# Patient Record
Sex: Female | Born: 1940 | Race: White | Hispanic: No | Smoking: Never smoker
Health system: Southern US, Community
[De-identification: ages and names within clinical notes are randomized; demographics above are authoritative.]

## PROBLEM LIST (undated history)

## (undated) DIAGNOSIS — Z9889 Other specified postprocedural states: Secondary | ICD-10-CM

## (undated) DIAGNOSIS — G459 Transient cerebral ischemic attack, unspecified: Secondary | ICD-10-CM

## (undated) DIAGNOSIS — R319 Hematuria, unspecified: Secondary | ICD-10-CM

## (undated) DIAGNOSIS — G629 Polyneuropathy, unspecified: Secondary | ICD-10-CM

## (undated) DIAGNOSIS — R7301 Impaired fasting glucose: Secondary | ICD-10-CM

## (undated) DIAGNOSIS — K649 Unspecified hemorrhoids: Secondary | ICD-10-CM

## (undated) DIAGNOSIS — E785 Hyperlipidemia, unspecified: Secondary | ICD-10-CM

## (undated) DIAGNOSIS — R112 Nausea with vomiting, unspecified: Secondary | ICD-10-CM

## (undated) DIAGNOSIS — M1990 Unspecified osteoarthritis, unspecified site: Secondary | ICD-10-CM

## (undated) DIAGNOSIS — R102 Pelvic and perineal pain: Secondary | ICD-10-CM

## (undated) DIAGNOSIS — N952 Postmenopausal atrophic vaginitis: Secondary | ICD-10-CM

## (undated) DIAGNOSIS — E538 Deficiency of other specified B group vitamins: Secondary | ICD-10-CM

## (undated) HISTORY — PX: TUBAL LIGATION: SHX77

## (undated) HISTORY — PX: ABDOMINAL HYSTERECTOMY: SHX81

## (undated) HISTORY — DX: Unspecified hemorrhoids: K64.9

## (undated) HISTORY — DX: Transient cerebral ischemic attack, unspecified: G45.9

## (undated) HISTORY — PX: OTHER SURGICAL HISTORY: SHX169

## (undated) HISTORY — DX: Postmenopausal atrophic vaginitis: N95.2

## (undated) HISTORY — DX: Unspecified osteoarthritis, unspecified site: M19.90

## (undated) HISTORY — DX: Hematuria, unspecified: R31.9

## (undated) HISTORY — DX: Hyperlipidemia, unspecified: E78.5

## (undated) HISTORY — DX: Pelvic and perineal pain: R10.2

## (undated) HISTORY — DX: Polyneuropathy, unspecified: G62.9

## (undated) HISTORY — PX: CATARACT EXTRACTION: SUR2

## (undated) HISTORY — DX: Impaired fasting glucose: R73.01

## (undated) HISTORY — DX: Deficiency of other specified B group vitamins: E53.8

## (undated) HISTORY — PX: ANKLE FRACTURE SURGERY: SHX122

## (undated) HISTORY — PX: APPENDECTOMY: SHX54

---

## 1998-06-12 ENCOUNTER — Encounter: Payer: Self-pay | Admitting: Emergency Medicine

## 1998-06-12 ENCOUNTER — Emergency Department (HOSPITAL_COMMUNITY): Admission: EM | Admit: 1998-06-12 | Discharge: 1998-06-12 | Payer: Self-pay | Admitting: Emergency Medicine

## 2000-11-19 ENCOUNTER — Ambulatory Visit (HOSPITAL_COMMUNITY): Admission: RE | Admit: 2000-11-19 | Discharge: 2000-11-19 | Payer: Self-pay | Admitting: Internal Medicine

## 2000-11-19 ENCOUNTER — Encounter: Payer: Self-pay | Admitting: Internal Medicine

## 2001-11-29 ENCOUNTER — Ambulatory Visit (HOSPITAL_COMMUNITY): Admission: RE | Admit: 2001-11-29 | Discharge: 2001-11-29 | Payer: Self-pay | Admitting: Internal Medicine

## 2001-11-29 ENCOUNTER — Encounter: Payer: Self-pay | Admitting: Internal Medicine

## 2001-12-05 ENCOUNTER — Encounter: Payer: Self-pay | Admitting: Internal Medicine

## 2001-12-05 ENCOUNTER — Ambulatory Visit (HOSPITAL_COMMUNITY): Admission: RE | Admit: 2001-12-05 | Discharge: 2001-12-05 | Payer: Self-pay | Admitting: Internal Medicine

## 2002-12-08 ENCOUNTER — Ambulatory Visit (HOSPITAL_COMMUNITY): Admission: RE | Admit: 2002-12-08 | Discharge: 2002-12-08 | Payer: Self-pay | Admitting: Internal Medicine

## 2003-09-23 ENCOUNTER — Encounter: Payer: Self-pay | Admitting: Orthopedic Surgery

## 2003-09-30 ENCOUNTER — Ambulatory Visit (HOSPITAL_COMMUNITY): Admission: RE | Admit: 2003-09-30 | Discharge: 2003-09-30 | Payer: Self-pay | Admitting: Internal Medicine

## 2003-09-30 HISTORY — PX: COLONOSCOPY: SHX174

## 2003-12-29 ENCOUNTER — Ambulatory Visit (HOSPITAL_COMMUNITY): Admission: RE | Admit: 2003-12-29 | Discharge: 2003-12-29 | Payer: Self-pay | Admitting: Internal Medicine

## 2004-12-29 ENCOUNTER — Ambulatory Visit (HOSPITAL_COMMUNITY): Admission: RE | Admit: 2004-12-29 | Discharge: 2004-12-29 | Payer: Self-pay | Admitting: Internal Medicine

## 2005-10-29 ENCOUNTER — Ambulatory Visit: Payer: Self-pay | Admitting: Orthopedic Surgery

## 2005-10-29 ENCOUNTER — Inpatient Hospital Stay (HOSPITAL_COMMUNITY): Admission: EM | Admit: 2005-10-29 | Discharge: 2005-10-31 | Payer: Self-pay | Admitting: Emergency Medicine

## 2005-10-30 ENCOUNTER — Encounter: Payer: Self-pay | Admitting: Orthopedic Surgery

## 2005-11-06 ENCOUNTER — Ambulatory Visit: Payer: Self-pay | Admitting: Orthopedic Surgery

## 2005-11-09 ENCOUNTER — Ambulatory Visit: Payer: Self-pay | Admitting: Orthopedic Surgery

## 2005-11-13 ENCOUNTER — Ambulatory Visit: Payer: Self-pay | Admitting: Orthopedic Surgery

## 2005-11-27 ENCOUNTER — Ambulatory Visit: Payer: Self-pay | Admitting: Orthopedic Surgery

## 2005-12-11 ENCOUNTER — Ambulatory Visit: Payer: Self-pay | Admitting: Orthopedic Surgery

## 2005-12-13 ENCOUNTER — Encounter (HOSPITAL_COMMUNITY): Admission: RE | Admit: 2005-12-13 | Discharge: 2006-01-22 | Payer: Self-pay | Admitting: Orthopedic Surgery

## 2005-12-25 ENCOUNTER — Ambulatory Visit: Payer: Self-pay | Admitting: Orthopedic Surgery

## 2006-01-01 ENCOUNTER — Ambulatory Visit (HOSPITAL_COMMUNITY): Admission: RE | Admit: 2006-01-01 | Discharge: 2006-01-01 | Payer: Self-pay | Admitting: Internal Medicine

## 2006-01-24 ENCOUNTER — Encounter (HOSPITAL_COMMUNITY): Admission: RE | Admit: 2006-01-24 | Discharge: 2006-02-23 | Payer: Self-pay | Admitting: Orthopedic Surgery

## 2006-01-25 ENCOUNTER — Ambulatory Visit: Payer: Self-pay | Admitting: Orthopedic Surgery

## 2006-02-22 ENCOUNTER — Ambulatory Visit: Payer: Self-pay | Admitting: Orthopedic Surgery

## 2006-03-14 ENCOUNTER — Ambulatory Visit: Payer: Self-pay | Admitting: Orthopedic Surgery

## 2006-11-28 ENCOUNTER — Ambulatory Visit (HOSPITAL_COMMUNITY): Admission: RE | Admit: 2006-11-28 | Discharge: 2006-11-28 | Payer: Self-pay | Admitting: Internal Medicine

## 2006-11-28 ENCOUNTER — Encounter: Payer: Self-pay | Admitting: Orthopedic Surgery

## 2007-01-04 ENCOUNTER — Ambulatory Visit (HOSPITAL_COMMUNITY): Admission: RE | Admit: 2007-01-04 | Discharge: 2007-01-04 | Payer: Self-pay | Admitting: Internal Medicine

## 2007-12-25 ENCOUNTER — Ambulatory Visit: Payer: Self-pay | Admitting: Orthopedic Surgery

## 2008-01-06 ENCOUNTER — Ambulatory Visit (HOSPITAL_COMMUNITY): Admission: RE | Admit: 2008-01-06 | Discharge: 2008-01-06 | Payer: Self-pay | Admitting: Internal Medicine

## 2008-01-08 ENCOUNTER — Ambulatory Visit: Payer: Self-pay | Admitting: Orthopedic Surgery

## 2008-01-08 DIAGNOSIS — M23302 Other meniscus derangements, unspecified lateral meniscus, unspecified knee: Secondary | ICD-10-CM | POA: Insufficient documentation

## 2008-01-08 DIAGNOSIS — M25569 Pain in unspecified knee: Secondary | ICD-10-CM | POA: Insufficient documentation

## 2008-01-08 DIAGNOSIS — M1710 Unilateral primary osteoarthritis, unspecified knee: Secondary | ICD-10-CM | POA: Insufficient documentation

## 2008-01-08 DIAGNOSIS — M2340 Loose body in knee, unspecified knee: Secondary | ICD-10-CM | POA: Insufficient documentation

## 2008-01-14 ENCOUNTER — Telehealth (INDEPENDENT_AMBULATORY_CARE_PROVIDER_SITE_OTHER): Payer: Self-pay | Admitting: Radiology

## 2008-01-22 ENCOUNTER — Ambulatory Visit (HOSPITAL_COMMUNITY): Admission: RE | Admit: 2008-01-22 | Discharge: 2008-01-22 | Payer: Self-pay | Admitting: Orthopedic Surgery

## 2008-01-29 ENCOUNTER — Ambulatory Visit: Payer: Self-pay | Admitting: Orthopedic Surgery

## 2008-02-26 ENCOUNTER — Ambulatory Visit: Payer: Self-pay | Admitting: Orthopedic Surgery

## 2008-03-18 ENCOUNTER — Ambulatory Visit: Payer: Self-pay | Admitting: Orthopedic Surgery

## 2008-03-20 ENCOUNTER — Inpatient Hospital Stay (HOSPITAL_COMMUNITY): Admission: EM | Admit: 2008-03-20 | Discharge: 2008-03-21 | Payer: Self-pay | Admitting: Emergency Medicine

## 2008-04-15 ENCOUNTER — Ambulatory Visit: Payer: Self-pay | Admitting: Orthopedic Surgery

## 2008-04-15 DIAGNOSIS — S83259A Bucket-handle tear of lateral meniscus, current injury, unspecified knee, initial encounter: Secondary | ICD-10-CM | POA: Insufficient documentation

## 2008-04-15 DIAGNOSIS — S83289A Other tear of lateral meniscus, current injury, unspecified knee, initial encounter: Secondary | ICD-10-CM | POA: Insufficient documentation

## 2008-04-15 DIAGNOSIS — M1710 Unilateral primary osteoarthritis, unspecified knee: Secondary | ICD-10-CM | POA: Insufficient documentation

## 2008-05-01 ENCOUNTER — Ambulatory Visit: Payer: Self-pay | Admitting: Orthopedic Surgery

## 2008-05-01 ENCOUNTER — Ambulatory Visit (HOSPITAL_COMMUNITY): Admission: RE | Admit: 2008-05-01 | Discharge: 2008-05-01 | Payer: Self-pay | Admitting: Orthopedic Surgery

## 2008-05-05 ENCOUNTER — Ambulatory Visit: Payer: Self-pay | Admitting: Orthopedic Surgery

## 2008-05-06 ENCOUNTER — Encounter (HOSPITAL_COMMUNITY): Admission: RE | Admit: 2008-05-06 | Discharge: 2008-06-05 | Payer: Self-pay | Admitting: Internal Medicine

## 2008-05-06 ENCOUNTER — Encounter: Payer: Self-pay | Admitting: Orthopedic Surgery

## 2008-05-21 ENCOUNTER — Encounter: Payer: Self-pay | Admitting: Orthopedic Surgery

## 2008-05-27 ENCOUNTER — Ambulatory Visit: Payer: Self-pay | Admitting: Orthopedic Surgery

## 2008-06-02 ENCOUNTER — Observation Stay (HOSPITAL_COMMUNITY): Admission: AD | Admit: 2008-06-02 | Discharge: 2008-06-03 | Payer: Self-pay | Admitting: Internal Medicine

## 2008-06-02 ENCOUNTER — Encounter (INDEPENDENT_AMBULATORY_CARE_PROVIDER_SITE_OTHER): Payer: Self-pay | Admitting: General Surgery

## 2008-06-02 ENCOUNTER — Ambulatory Visit (HOSPITAL_COMMUNITY): Admission: RE | Admit: 2008-06-02 | Discharge: 2008-06-02 | Payer: Self-pay | Admitting: Internal Medicine

## 2008-08-03 ENCOUNTER — Ambulatory Visit: Payer: Self-pay | Admitting: Orthopedic Surgery

## 2008-11-18 ENCOUNTER — Ambulatory Visit: Payer: Self-pay | Admitting: Orthopedic Surgery

## 2009-02-18 ENCOUNTER — Ambulatory Visit (HOSPITAL_COMMUNITY): Admission: RE | Admit: 2009-02-18 | Discharge: 2009-02-18 | Payer: Self-pay | Admitting: Internal Medicine

## 2010-02-13 ENCOUNTER — Encounter: Payer: Self-pay | Admitting: Internal Medicine

## 2010-02-22 NOTE — Assessment & Plan Note (Signed)
Summary: 3 M RE-CK RT KNEE/POST OP 05/01/08/MEDICARE,CIGNA/CAF   Visit Type:  Follow-up  CC:  right knee pain.  History of Present Illness: I saw [name] in the office today for a 3 MONTH followup visit.  She is a 70 years old woman with the complaint of:  RIGHT KNEE.   DOS  05/01/08   Procedure   SARK, lateral and medial menisectomies, medial and lateral chondroplasty.  Medication  NONE. [KNEE]  ALEVE SOMETIMES, GLUCOSAMINE    Subjectives  She states that her knee is good for the most part, it bothers her if she up on it alot.  XRAYS TODAY.        Allergies: No Known Drug Allergies  Social History: Patient is WIDOWED   Review of Systems       review of systems: Her husband died so she is somewhat depressed and breathing.  Her LEFT knee is fine.    Physical Exam  Additional Exam:  the patient is awake, alert, groin at x3. Mood is flat.  She is well grown normal appearance.  Normal perfusion in both lower extremities.  Full range of motion with medial joint line tenderness on the RIGHT side none on the LEFT. Strength normal. Knees are stable.     Impression & Recommendations:  Problem # 1:  PRIMARY LOCALIZED OSTEOARTHROSIS LOWER LEG (ICD-715.16) Assessment Unchanged  Orders: Knee x-ray,  3 views (73562)/RIGHT KNEE  Findings: Lateral compartment disease with spurs, traction osteophyte superior patella joint space narrowing laterally. Severe.  Impression osteoarthritis, primarily lateral compartment disease.  she is in stable condition in terms of the knee at this, time. We'll continue with current medicines, including Aleve and glucosamine.   Est. Patient Level III (04540)  Patient Instructions: 1)  Please schedule a follow-up appointment in 6 months. 2)  XRAY  IN 6 MONTHS RIGHT KNEE

## 2010-02-22 NOTE — Assessment & Plan Note (Signed)
Summary: rt leg pain/no xr medicare/cigna/bsf    History of Present Illness: I saw [name] in the office today for a followup visit.  She is a 70 years old woman with the complaint of:  new problem, rt knee pain that radiates down her right  leg and up to her right hip.  This is a delightful 9 her old lady who had a LEFT ankle fracture which was treated with open treatment internal fixation and she did very well. She presents back at this time for pain in her RIGHT knee which radiates proximally and distally. 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  She denies any back pain other than some occasional aching but this does not seem to bother her.  She denies any injury to her lumbar spine or knee.  She feels like there is a mass behind the knee or lump.  She feels that the knee is swollen.  Although she did not complain of this we picked up a clicking sensation in the lateral knee with visible clicking and clunking in the lateral joint line.  She says she does notice this and it is painful.  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  Lyrica 75mg  is taken for neuropathy for 3 yrs, amitryptaline 100mg  at night does not help much.  She has some stiffness in her right knee after sitting for a long time.  Tylenol and aleve help some. but gives incomplete relief   2008 bone density revealed that she has osteopenia.        Updated Prior Medication List: LYRICA 75 MG CAPS (PREGABALIN)  AMITRIPTYLINE HCL 100 MG TABS (AMITRIPTYLINE HCL)   Current Allergies: No known allergies   Past Medical History:    Reviewed history from 12/24/2007 and no changes required:       B-12 Deficency  Past Surgical History:    Reviewed history from 12/24/2007 and no changes required:       Hysterectomy       OTIF Left ankle 01-30-05 by Dr. Romeo Apple   Family History:    Reviewed history and no changes required:  noncontributory  Social History:    Reviewed history and no changes required:       Patient is married.     Review of Systems  The review of systems is negative for General, Cardiac , Resp, GI, GU, Neuro, MS, Endo, Psych, Derm, EENT, Immunology, and Lymphatic.   Physical Exam  Msk:     She is well-developed well-nourished and grooming and hygiene are normal.   Pulses:     pulses normal in all 4 extremities Extremities:     The upper extremities did not show any contracture subluxation atrophy tremor malalignment  The lower extremities show normal range of motion in the hip and knee and both limbs ankle motion is normal as well. There is no deformity, dry subluxation, muscle atrophy or tremor.  The RIGHT knee is notable for popliteal cyst. Tender lateral joint line visible clicking and painful clicking, tenderness appears to be just below the joint line more so over the iliotibial band.  I could not detect a joint effusion.  All ligaments were stable and the RIGHT knee.   Neurologic:     no focal deficits,  normal reflexes, coordination, muscle strength and tone Skin:     intact without lesions or rashes Inguinal Nodes:     no significant adenopathy Psych:  alert and cooperative; normal mood and affect; normal attention span and concentration    Impression & Recommendations: Data: 3 views of the lumbar spine shows that there is spondylolisthesis of L4 on 5 grade 1 degenerative facet arthritis and degenerative scoliosis as well.  X-rays of the knee:there are degenerative changes in the knee although with symmetrical joint space opening is noted there are marginal spurs on the femur and there is medial patellofemoral degenerative changes.  Impression osteoarthritis of the knee.  All I think the patient is having production of arthritic fluid which is leaking to the posterior capsule forming a cyst.  I recommended and she agreed to intra-articular injection RIGHT  KNEE Verbal consent was obtained. The knee was prepped with alcohol and ethyl chloride. 1 cc of depomedrol 40mg /cc and 4 cc of lidocaine 1% was injected. there were no complications.     Medications Added to Medication List This Visit: 1)  Lyrica 75 Mg Caps (Pregabalin) 2)  Amitriptyline Hcl 100 Mg Tabs (Amitriptyline hcl)   Patient Instructions: 1)  You have received an injection of cortisone today. You may experience increased pain at the injection site. Apply ice pack to the area for 20 minutes every 2 hours and take 2 xtra strength tylenol every 8 hours. This increased pain will usually resolve in 24 hours. The injection will take effect in 3-10 days.  2)  2 weeks    ]

## 2010-02-28 ENCOUNTER — Other Ambulatory Visit (HOSPITAL_COMMUNITY): Payer: Self-pay | Admitting: Internal Medicine

## 2010-02-28 DIAGNOSIS — Z139 Encounter for screening, unspecified: Secondary | ICD-10-CM

## 2010-03-01 ENCOUNTER — Ambulatory Visit (HOSPITAL_COMMUNITY)
Admission: RE | Admit: 2010-03-01 | Discharge: 2010-03-01 | Disposition: A | Discharge status: Home / Self Care | Payer: MEDICARE | Source: Ambulatory Visit | Attending: Internal Medicine | Admitting: Internal Medicine

## 2010-03-01 DIAGNOSIS — Z1231 Encounter for screening mammogram for malignant neoplasm of breast: Secondary | ICD-10-CM | POA: Insufficient documentation

## 2010-03-01 DIAGNOSIS — Z139 Encounter for screening, unspecified: Secondary | ICD-10-CM

## 2010-05-04 LAB — HEMOGLOBIN AND HEMATOCRIT, BLOOD
HCT: 37.7 % (ref 36.0–46.0)
Hemoglobin: 13.6 g/dL (ref 12.0–15.0)

## 2010-05-04 LAB — BASIC METABOLIC PANEL
BUN: 11 mg/dL (ref 6–23)
CO2: 27 mEq/L (ref 19–32)
Chloride: 102 mEq/L (ref 96–112)
Potassium: 4.5 mEq/L (ref 3.5–5.1)

## 2010-05-10 LAB — COMPREHENSIVE METABOLIC PANEL
ALT: 24 U/L (ref 0–35)
ALT: 24 U/L (ref 0–35)
AST: 25 U/L (ref 0–37)
Alkaline Phosphatase: 84 U/L (ref 39–117)
Alkaline Phosphatase: 78 U/L (ref 39–117)
BUN: 10 mg/dL (ref 6–23)
CO2: 27 mEq/L (ref 19–32)
CO2: 28 mEq/L (ref 19–32)
Chloride: 105 mEq/L (ref 96–112)
Chloride: 106 mEq/L (ref 96–112)
GFR calc Af Amer: >60 mL/min (ref >60)
GFR calc non Af Amer: >60 mL/min (ref >60)
GFR calc non Af Amer: >60 mL/min (ref >60)
Glucose, Bld: 100 mg/dL — ABNORMAL HIGH (ref 70–99)
Potassium: 4.3 mEq/L (ref 3.5–5.1)
Sodium: 138 mEq/L (ref 135–145)
Sodium: 140 mEq/L (ref 135–145)
Total Bilirubin: 0.6 mg/dL (ref 0.3–1.2)
Total Bilirubin: 0.9 mg/dL (ref 0.3–1.2)
Total Protein: 5.9 g/dL — ABNORMAL LOW (ref 6.0–8.3)

## 2010-05-10 LAB — COMPREHENSIVE METABOLIC PANEL WITH GFR
AST: 24 U/L (ref 0–37)
Albumin: 3.6 g/dL (ref 3.5–5.2)
Calcium: 9 mg/dL (ref 8.4–10.5)
Creatinine, Ser: 0.76 mg/dL (ref 0.4–1.2)
GFR calc Af Amer: >60 mL/min (ref >60)

## 2010-05-10 LAB — HEMOGLOBIN A1C
Hgb A1c MFr Bld: 4.8 % (ref 4.6–6.1)
Mean Plasma Glucose: 91 mg/dL

## 2010-05-10 LAB — RAPID URINE DRUG SCREEN, HOSP PERFORMED
Benzodiazepines: NOT DETECTED
Cocaine: NOT DETECTED
Opiates: NOT DETECTED
Tetrahydrocannabinol: NOT DETECTED

## 2010-05-10 LAB — LIPID PANEL
Cholesterol: 186 mg/dL (ref 0–200)
HDL: 35 mg/dL — ABNORMAL LOW (ref >39)
LDL Cholesterol: 115 mg/dL — ABNORMAL HIGH (ref 0–99)
Total CHOL/HDL Ratio: 5.3 RATIO
Triglycerides: 179 mg/dL — ABNORMAL HIGH (ref <150)
VLDL: 36 mg/dL (ref 0–40)

## 2010-05-10 LAB — CBC
HCT: 39.7 % (ref 36.0–46.0)
HCT: 36.7 % (ref 36.0–46.0)
Hemoglobin: 12.9 g/dL (ref 12.0–15.0)
MCHC: 35.3 g/dL (ref 30.0–36.0)
MCHC: 35.2 g/dL (ref 30.0–36.0)
MCV: 91.7 fL (ref 78.0–100.0)
MCV: 90.8 fL (ref 78.0–100.0)
Platelets: 170 K/uL (ref 150–400)
RBC: 4.33 MIL/uL (ref 3.87–5.11)
RBC: 4.04 MIL/uL (ref 3.87–5.11)
RDW: 13.7 % (ref 11.5–15.5)
WBC: 6.6 10*3/uL (ref 4.0–10.5)
WBC: 5.5 K/uL (ref 4.0–10.5)

## 2010-05-10 LAB — DIFFERENTIAL
Basophils Absolute: 0 10*3/uL (ref 0.0–0.1)
Basophils Relative: 0 % (ref 0–1)
Eosinophils Absolute: 0.4 10*3/uL (ref 0.0–0.7)
Eosinophils Relative: 7 % — ABNORMAL HIGH (ref 0–5)

## 2010-05-10 LAB — POCT CARDIAC MARKERS: Troponin i, poc: <0.05 ng/mL (ref 0.00–0.09)

## 2010-05-10 LAB — URINALYSIS, ROUTINE W REFLEX MICROSCOPIC
Glucose, UA: NEGATIVE mg/dL
Ketones, ur: NEGATIVE mg/dL
Protein, ur: NEGATIVE mg/dL
Urobilinogen, UA: 0.2 mg/dL (ref 0.0–1.0)

## 2010-05-10 LAB — PROTIME-INR
INR: 1.1 (ref 0.00–1.49)
Prothrombin Time: 13.7 seconds (ref 11.6–15.2)
Prothrombin Time: 14.2 s (ref 11.6–15.2)

## 2010-05-10 LAB — APTT: aPTT: 29 seconds (ref 24–37)

## 2010-06-07 NOTE — Op Note (Signed)
NAME:  [name], [name]                ACCOUNT NO.:  [id number]   MEDICAL RECORD NO.:  [id number]          PATIENT TYPE:  AMB   LOCATION:  DAY                           FACILITY:  [facility]   PHYSICIAN:  Vickki Hearing, M.D.DATE OF BIRTH:  20-Apr-1940   DATE OF PROCEDURE:  05/01/2008  DATE OF DISCHARGE:                               OPERATIVE REPORT   INDICATIONS FOR PROCEDURE:  Persistent right knee pain with torn menisci  and osteoarthritis.   HISTORY:  [name] is now 70 years old, [name] has been followed for  right knee pain for several months.  Her pain was treated with activity  modification, oral pain medications, anti-inflammatories, and several  injections.  [name] did not improve and actually worsened, we got an MRI  that showed 3-compartment disease with synovitis, medial meniscal tear,  and lateral meniscal tear.  [name] opted for surgical treatment after  discussion of her risks and benefits and other alternative procedures.   PREOPERATIVE DIAGNOSES:  Osteoarthritis and medial and lateral meniscal  tears of the right knee.   POSTOPERATIVE DIAGNOSES:  Osteoarthritis and medial and lateral meniscal  tears of the right knee.   PROCEDURE:  An arthroscopy of the right knee with medial and lateral  meniscectomies, medial and lateral chondroplasty.   SURGEON:  Vickki Hearing, MD   ASSISTANTS:  There were no assistants.   ANESTHETIC:  Spinal.   OPERATIVE FINDINGS:  There was osteoarthritis of all 3 compartments.  There was a linear grade 4 lesion of the medial femoral condyle,  measuring approximately 10 mm x 1 mm.  There was a circular grade 3  lesion of the lateral femoral condyle over the lateral meniscus.  There  was a grade 4 patellar lesion.  There was a displaced anterior horn  lateral meniscal tear and a free edge nondisplaced posterior horn medial  meniscal tear.   SPECIMENS:  There were no specimens.   BLOOD LOSS:  Minimal.   COMPLICATIONS:  None.   COUNTS:   Correct.   PROCEDURE IN DETAIL:  Site marking and patient identification were  performed on [name], in the preop area.  [name] was given  preoperative antibiotics with a gram of Ancef.  [name] was taken to Surgery  for spinal anesthetic.  [name] was placed in supine.  Her right leg was  placed in an arthroscopic leg holder and left leg was padded in a well  leg holder.   Her leg was prepped with Betadine, draped sterilely.  Time-out procedure  was then initiated and completed.   Lateral portal was established, the scope was introduced laterally.  A  diagnostic arthroscopy was performed.   We began our work in the patellofemoral area and did a debridement.   We then turned our attention to the lateral compartment where we did a  partial lateral meniscectomy, balanced the meniscus, injected with a  probe, it was stable.  We then did a chondroplasty with a straight  shaver on the lateral femoral condylar lesion to a stable rim.   We debrided the notch and then turned our  attention to the medial side  where we performed a partial medial meniscectomy of the free edge on the  posterior horn.  We used a combination of instruments including a shaver  and an ArthroCare wand 150 degree.   We probed that and it was stable once we were done.   We then irrigated the knee and closed with a 3-0 nylon suture.   We applied sterile dressing and cryo cuff.  [name] was taken to the  recovery room in stable condition.   POSTOPERATIVE PLAN:  Should be discharged on Vicodin and Phenergan.  We  used Vicodin 5 mg one q.4 p.r.n. for pain, #60, one refill, and  Phenergan 25 mg q.4 p.r.n. for nausea, #30, no refills.   Physical therapy to start on Wednesday.  Postop on Tuesday, full  weightbearing with a walker.      Vickki Hearing, M.D.  Electronically Signed     SEH/MEDQ  D:  05/01/2008  T:  05/02/2008  Job:  [id number]

## 2010-06-07 NOTE — H&P (Signed)
NAMEALFONSO, SHACKETT                ACCOUNT NO.:  [id number]   MEDICAL RECORD NO.:  [id number]          PATIENT TYPE:  OBV   LOCATION:  A332                          FACILITY:  [facility]   PHYSICIAN:  Kingsley Callander. Ouida Sills, MD       DATE OF BIRTH:  1940-02-06   DATE OF ADMISSION:  06/02/2008  DATE OF DISCHARGE:  05/12/2010LH                              HISTORY & PHYSICAL   CHIEF COMPLAINT:  Right lower quadrant pain.   HISTORY OF PRESENT ILLNESS:  This patient is a 70 year old white female  who presented to the office after being awakened from sleep at 4:00 a.m.  with pain in her right lower abdomen.  She experienced multiple bouts of  watery diarrhea.  She denied fever, rectal bleeding, melena, or  vomiting.  She had experienced mild nausea.  Her appetite is diminished.  She took a hydrocodone and had minimal relief.  She has rated her pain  at an 8/10.  Her only abdominal surgery has been a hysterectomy and a  tubal ligation.   PAST MEDICAL HISTORY:  1. 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  2. Degenerative joint disease.  3. TIA.   MEDICATIONS:  1. Aspirin 325 mg daily.  2. Lyrica 75 mg b.i.d.  3. Amitriptyline 100 mg nightly.  4. Multivitamin daily.  5. Calcium with vitamin D b.i.d.  6. Zocor.   ALLERGIES:  She is intolerant of SULFA.   SOCIAL HISTORY:  She does not smoke, drink or use drugs.   FAMILY HISTORY:  Her mother had coronary artery disease.  Her father had  alcoholism, peptic ulcer disease, and rheumatoid arthritis.  Her brother  has had rheumatoid arthritis.   REVIEW OF SYSTEMS:  Negative.   PHYSICAL EXAMINATION:  VITAL SIGNS:  Temperature 97.3, weight 162, pulse  80, and blood pressure 144/82.  GENERAL:  Alert and uncomfortable.  HEENT:  No scleral icterus.  Pharynx is dry.  NECK:  Supple with no JVD or thyromegaly.  LUNGS:  Clear.  HEART:  Regular with no murmurs.  ABDOMEN:  Tender in the right lower quadrant.  No rebound or guarding.  No  hepatosplenomegaly or palpable mass.  RECTAL:  Brown Hemoccult negative stool and no palpable rectal masses.  She had a colonoscopy in 2005.  EXTREMITIES:  No cyanosis, clubbing, or edema.  NEUROLOGIC:  No focal weakness.  LYMPH NODES:  No cervical or supraclavicular enlargement.  SKIN:  Warm and dry.   LABORATORY DATA:  White count 12.9, hemoglobin 13.2, and platelets  187,000.  Comprehensive metabolic panel is normal.  Urinalysis is  negative.   IMPRESSION:  1. Right lower quadrant pain.  She had undergone a CT scan of the      abdomen, which reveals acute appendicitis.  A surgical consultation      will be obtained with Dr. Lovell Sheehan.  2. 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  She will continue      monthly B12 injections.  3. History of transient ischemic attack.  Restart aspirin and Zocor      after her appendectomy.  Kingsley Callander. Ouida Sills, MD  Electronically Signed     ROF/MEDQ  D:  06/03/2008  T:  06/03/2008  Job:  [id number]

## 2010-06-07 NOTE — Discharge Summary (Signed)
NAME:  [name], SZYMBORSKI NO.:  [id number]   MEDICAL RECORD NO.:  [id number]          PATIENT TYPE:  OBV   LOCATION:  A332                          FACILITY:  [facility]   PHYSICIAN:  Dalia Heading, M.D.  DATE OF BIRTH:  06-17-1940   DATE OF ADMISSION:  06/02/2008  DATE OF DISCHARGE:  05/12/2010LH                               DISCHARGE SUMMARY   HOSPITAL COURSE SUMMARY:  The patient is a 70 year old white female who  had an outpatient CT scan of the abdomen and pelvis for right lower  quadrant abdominal pain and was found to have acute appendicitis.  She  was admitted by Dr. Carylon Perches and Surgery consultation was obtained.  The patient was taken to the operating room on Jun 02, 2008, underwent  laparoscopic appendectomy.  She tolerated the procedure well.  Postoperative course has been unremarkable.  Her diet was advanced  without difficulty.   The patient is being discharged home on postoperative day #1 in good and  improving condition.   DISCHARGE INSTRUCTIONS:  The patient is to follow up with Dr. Franky Macho on Jun 11, 2008.   DISCHARGE MEDICATIONS:  1. Vicodin 1-2 tablets p.o. q.4 h. p.r.n. pain.  2. Lyrica 75 mg p.o. b.i.d.  3. Amitriptyline 100 mg p.o. nightly.  4. Multivitamin 1 tablet p.o. daily.  5. Calcium D 1 tablet p.o. twice a day.   PRINCIPAL DIAGNOSES:  1. Acute appendicitis.  2. Depression.   PRINCIPAL PROCEDURE:  Laparoscopic appendectomy on Jun 02, 2008.      Dalia Heading, M.D.  Electronically Signed     MAJ/MEDQ  D:  06/03/2008  T:  06/03/2008  Job:  [id number]   cc:   Kingsley Callander. Ouida Sills, MD  Fax: [phone]

## 2010-06-07 NOTE — Discharge Summary (Signed)
[name], [name]                ACCOUNT NO.:  [id number]   MEDICAL RECORD NO.:  [id number]          PATIENT TYPE:  INP   LOCATION:  3038                         FACILITY:  [facility]   PHYSICIAN:  Pramod P. Pearlean Brownie, MD    DATE OF BIRTH:  [birth year]   DATE OF ADMISSION:  03/20/2008  DATE OF DISCHARGE:  03/21/2008                               DISCHARGE SUMMARY   ADMISSION DIAGNOSIS:  Right-sided numbness.   DISCHARGE DIAGNOSIS:  1. Left hemispheric transient ischemic attack.  2. Hyperlipidemia.   HOSPITAL COURSE:  [name] is a 70 year old lady who presented with  sudden onset of numbness involving the right face, tongue, arm, and leg  with weakness and dysmetria, to [facility] emergency room.  A code  stroke was called and she was transferred to [facility] where  her symptoms improved significantly at [facility] arrival, but did not resolve  completely.  She was admitted to the Stroke Unit for further evaluation.  She was kept on telemetry monitoring and underwent CT scan of the head  initially at [facility] which was unremarkable.  Subsequently, she had  an MRI scan of the brain as well as MRA of the brain, both of which did  not reveal any stroke and/or significant stenosis.  Her LDL cholesterol  was found to be elevated at 115.  She was started on aspirin for  secondary stroke prevention and also on Zocor for elevated lipids.  WBC  count and electrolytes were normal.  Triglycerides were slightly  elevated at 179, LDL was 115, VLDL was 36, HDL was low at 35, hemoglobin  A1c was 4.8.  UA was negative.   She was symptomatic at the time of discharge.  She was advised to follow  up with primary doctor in 2 weeks and Dr. Pearlean Brownie in 2 months.   DISCHARGE MEDICATIONS:  1. Aspirin 325 mg a day.  2. Zocor 20 mg a day.  3. Lyrica 75 twice a day.  4. Multivitamin once a day.  5. Calcium with vitamin D daily.  6. Vitamin B12 injections monthly.  7. Amitriptyline daily.     ______________________________  Sunny Schlein. Pearlean Brownie, MD     PPS/MEDQ  D:  04/29/2008  T:  04/30/2008  Job:  [id number]

## 2010-06-07 NOTE — Op Note (Signed)
NAMEORIS, CALMES                ACCOUNT NO.:  [id number]   MEDICAL RECORD NO.:  [id number]          PATIENT TYPE:  INP   LOCATION:  A332                          FACILITY:  [facility]   PHYSICIAN:  Dalia Heading, M.D.  DATE OF BIRTH:  09-Jun-1940   DATE OF PROCEDURE:  06/02/2008  DATE OF DISCHARGE:                               OPERATIVE REPORT   PREOPERATIVE DIAGNOSIS:  Acute appendicitis.   POSTOPERATIVE DIAGNOSIS:  Acute appendicitis.   PROCEDURE:  Laparoscopic appendectomy.   SURGEON:  Dalia Heading, M.D.   ANESTHESIA:  General endotracheal.   INDICATIONS:  The patient is a 70 year old white female who presents  with a less than 24-hour history of right lower quadrant abdominal pain.  CT scan of the abdomen and pelvis revealed acute appendicitis.  The  patient now comes to the operating room for a laparoscopic appendectomy.  Risks and benefits of the procedure including bleeding, infection, pain,  and the possibility of an open procedure were fully explained to the  patient, I gave informed consent.   DESCRIPTION OF PROCEDURE:  The patient was placed in the supine  position.  After induction of general endotracheal anesthesia, the  abdomen was prepped and draped using the usual sterile technique with  DuraPrep.  Surgical site confirmation was performed.   A supraumbilical incision was made down to the fascia.  A Veress needle  was introduced into the abdominal cavity and confirmation of placement  was done using the saline drop test.  The abdomen was then insufflated  to 16 mmHg pressure without difficulty.  An 11-mm trocar was introduced  into the abdominal cavity under direct visualization without difficulty.  The patient was placed in deeper Trendelenburg position.  An additional  12-mm trocar was placed in the suprapubic region and a 5-mm trocar was  placed in the left lower quadrant region.  The appendix was visualized  and noted to be inflamed.  The mesoappendix was  divided using the  Harmonic scalpel.  Vascular Endo-GIA was placed across the base of the  appendix and fired.  The appendix was removed using EndoCatch bag  without difficulty.  The appendiceal stump was inspected and noted to be  within normal limits.  No abnormal bleeding was noted at the end of the  procedure.  The right lower quadrant was copiously irrigated with normal  saline.  All fluid and air were then evacuated from the abdominal cavity  prior to removal of the trocars.   All wounds were irrigated with normal saline.  All wounds were injected  with 0.5% Sensorcaine.  The supraumbilical fascia was reapproximated  using 0 Vicryl interrupted suture.  All skin incisions were closed using  staples.  Betadine ointment and dry sterile dressings were applied.   All tape and needle counts correct at the end of the procedure.  The  patient was extubated in the operating room went back to recovery room  awake in stable condition.   COMPLICATIONS:  None.   SPECIMEN:  Appendix.   ESTIMATED BLOOD LOSS:  Minimal.      Dalia Heading,  M.D.  Electronically Signed     MAJ/MEDQ  D:  06/02/2008  T:  06/03/2008  Job:  [id number]   cc:   Kingsley Callander. Ouida Sills, MD  Fax: [phone]

## 2010-06-07 NOTE — H&P (Signed)
[name], [name]                ACCOUNT NO.:  [id number]   MEDICAL RECORD NO.:  [id number]          PATIENT TYPE:  INP   LOCATION:  3030                         FACILITY:  [facility]   PHYSICIAN:  Casimiro Needle L. Reynolds, M.D.DATE OF BIRTH:  07-18-1940   DATE OF ADMISSION:  03/20/2008  DATE OF DISCHARGE:                              HISTORY & PHYSICAL   CHIEF COMPLAINT:  Code stroke with right-sided numbness.   HISTORY OF PRESENT ILLNESS:  This is the initial Stroke Service  admission for this 70 year old woman with a past medical history, which  includes B12 deficiency.  The patient says that she was at home doing  routine activities this evening when at about 7:30 p.m. she experienced  sudden onset of numbness involving the entire right side of her body,  including her face, tongue, arm, and leg.  There was no associated  weakness, no dysarthria, no vision changes.  She presented to [facility]  Emergency Department, her code stroke was called, and in discussion with  the ED physician, she was transferred to the emergency department of  [facility] for further evaluation.  On arrival here, her symptoms had  improved, although she still complains of some subjective numbness.  She  is admitted for further workup of this problem.   PAST MEDICAL HISTORY:  Remarkable for B12 deficiency with resultant  polyneuropathy for which she takes medications.  Beyond that she denies  any chronic medical problems.   PRIMARY CARE PHYSICIAN:  Kingsley Callander. Ouida Sills, MD   MEDICATIONS:  1. Lyrica 75 mg b.i.d.  2. Amitriptyline uncertain dose nightly.  3. Aspirin.  4. Multivitamin.  5. Calcium with vitamin D.  6. B12 injections monthly.   ALLERGIES:  No known drug allergies.   FAMILY HISTORY:  Her mother and several aunts and uncles had aneurysms.  No family history of early stroke.   SOCIAL HISTORY:  She is independent with her activities of daily living.  She denies any history of tobacco use.   REVIEW OF  SYSTEMS:  Full 10 system of review of systems is negative  except as outlined in the HPI and in the accompanying admission nursing  record.   PHYSICAL EXAMINATION:  VITAL SIGNS: Temperature 97.4, blood pressure  133/81, pulse 77, respirations 16, O2 sat 100% on 2 L O2 nasal cannula.  GENERAL:  This is a healthy-appearing woman, supine in the hospital bed,  in no distress.  HEENT:  Head, cranium is normocephalic and atraumatic.  Oropharynx is  benign.  NECK:  Supple without carotid bruit.  CHEST: Clear to auscultation bilaterally.  HEART: Regular rate and rhythm without murmur.  NEUROLOGIC:  Mental Status:  She is awake and alert.  She is oriented to  time, place, and person.  Recent and remote memory are intact.  Attention span, concentration, and fund of knowledge are all  appropriate.  Speech is fluent and not dysarthric.  Cranial Nerves:  Pupils are equal and reactive.  Extraocular movements are full without  nystagmus.  Visual fields are full to confrontation.  Hearing is intact  to conversational speech.  Facial  sensation is intact to pinprick.  Face, tongue, and palate move normally and symmetrically.  Motor:  Normal bulk and tone.  Normal strength in all tested extremity muscles.  Sensation intact to pinprick, light touch, and double stimulation in all  extremities.  Coordination: Finger-to-nose and heel-to-shin are  performed accurately.  Reflexes are diminished at the ankles, otherwise  2+ and symmetric.  Toes are downgoing.   LABORATORY REVIEW:  CBC:  White count 6.6, hemoglobin 14, platelets  194,000.  Chemistries were normal except for an elevated glucose of 110.  Coags are normal.  CT of the head is normal.  LFTs are normal.  Cardiac  enzymes are negative.   ASSESSMENT:  Suspected right brain TIA or a tiny lacunar infarct, most  likely involving thalamus or adjacent sensory fibers.   PLAN:  For now we will continue aspirin.  We will check MRI of the brain  with MRA of  the intracranial and extracranial circulation.  If a stroke  is found, we will strengthen antiplatelet therapy, continue aspirin. It  is likely she be discharged tomorrow to finish the rest of her workup as  outpatient.  Stroke Service to follow.      Michael L. Thad Ranger, M.D.  Electronically Signed     Marolyn Hammock. Thad Ranger, M.D.  Electronically Signed    MLR/MEDQ  D:  03/21/2008  T:  03/21/2008  Job:  [id number]

## 2010-06-10 NOTE — Op Note (Signed)
NAMERAYMONDE, HAMBLIN                ACCOUNT NO.:  [id number]   MEDICAL RECORD NO.:  [id number]          PATIENT TYPE:  INP   LOCATION:  A316                          FACILITY:  [facility]   PHYSICIAN:  Vickki Hearing, M.D.DATE OF BIRTH:  May 28, 1940   DATE OF PROCEDURE:  DATE OF DISCHARGE:                                 OPERATIVE REPORT   CHIEF COMPLAINT:  Left ankle pain.   HISTORY:  This is a 70 year old female who was brought into the hospital  because of an injury to her left ankle.  She fell earlier on the morning of  October 7.  After the fall, she had intense pain and deformity and was  brought to the hospital for evaluation.  Radiograph showed a bimalleolar  fracture with subluxation of the talus in relation to the mortis.  She was  also noted to have a lack of dorsalis pedis pulses, normal posterior tib,  and a cool foot.   In the emergency room, she was treated with closed manipulation and  application of splint.  Radiographs after the splint showed the fracture to  be reduced.  The pulse returned.  Patient was prepped for surgery.   PREOPERATIVE DIAGNOSIS:  Bimalleolar fracture/subluxation of the left ankle.   POSTOPERATIVE DIAGNOSIS:  Bimalleolar fracture/subluxation of the left  ankle.   PROCEDURE:  Open treatment, internal fixation.   IMPLANTS USED:  Synthes small fragment at 7-hole lateral plate with lag  screw using six screws, leaving one open, and two medial 4-0 screws with  bone graft.  Bone graft uses autogenous and allograft.   SURGEON:  Vickki Hearing, M.D.   ANESTHESIA:  Spinal.   OPERATIVE FINDINGS:  The medial malleolus had a significant amount of  comminution, but the plafond had no impaction injury.  There was no injury  to the talar dome.  There was lateral malleolar fracture, which was oblique,  consistent with a supination/external rotation-type injury.   This patient was identified in the preop area as Joesphine Bare.  Her left  ankle was  marked by the patient and the surgeon.  The surgical site  confirmed by radiographs and history and physical.  She was taken to the  operating room for spinal anesthetic.  Placed on the operating room table.  The left leg was prepped and draped using sterile technique.  A time-out  procedure was completed.  The procedure was confirmed as open  treatment/internal fixation, left ankle, on Joesphine Bare.   After exsanguination of the limb with a 6 [place] esmarch, the tourniquet was  inflated to 300 mmHg, where it stayed for approximately one hour, 15  minutes.  A straight incision was made over the medial malleolus, and the  medial malleolus was reduced and held with K wires.  At this time, it was  noted that the talar dome was intact, as was the plafond, and prior to  reduction, the joint was irrigated.  We then turned out attention to the  lateral malleolus.  We made a straight incision a little more anterior than  usual.  We divided the subcu  tissue down to bone, subperiosteally dissected  the bone and exposed the fracture.  Using a bone clamp, the fracture was  reduced, confirmed with C-arm and then a lag screw was placed using a 3.5  and 2.5 drill bit countersunk depth cage.  The screw was placed.  The  fracture was reduced anatomically by visualization and radiograph.   A 7-hole plate was then contoured to the fibula and applied using AO  technique.  Radiographs then confirmed reduction of the mortis.  We returned  our attention back to the medial side, where we placed two 4-0 malleolar  partially threaded screws using a 2.5 drill bit.  After placing the screws,  we then bone-grafted the area with autogenous graft from the local area and  then allograft bone chips.  Radiographs showed excellent reduction in AP,  lateral, and oblique films.  The wounds were irrigated and closed on the  medial side with 2-0 Monocryl and staples, the lateral side with 0, 2-0, and  staples.  We injected 30  cc of plain Sensorcaine around the wounds and then  applied a short sugar-tong splint with the ankle in neutral position.  The  tourniquet was released, the toes looked good and viable, good capillary  refill and color.   Patient taken to the recovery room in stable condition.   POSTOP PLAN:  Nonweightbearing, six weeks.  Physical therapy.  Cast change  during the postop period.  We will also treat with Lovenox for DVT  prevention.      Vickki Hearing, M.D.  Electronically Signed     SEH/MEDQ  D:  10/30/2005  T:  10/30/2005  Job:  [id number]

## 2010-06-10 NOTE — H&P (Signed)
[name], [name]                ACCOUNT NO.:  [id number]   MEDICAL RECORD NO.:  [id number]          PATIENT TYPE:  INP   LOCATION:  A316                          FACILITY:  [facility]   PHYSICIAN:  Vickki Hearing, M.D.DATE OF BIRTH:  05-Apr-1940   DATE OF ADMISSION:  10/29/2005  DATE OF DISCHARGE:  LH                                HISTORY & PHYSICAL   CHIEF COMPLAINT:  Left ankle pain.   HISTORY:  This is a 70 year old female status post hysterectomy who was in  her usual state of health until October 28, 2005 when she became nauseous,  had some vomiting, felt sick all day, was unable to really keep any solid  foods down.  She got up this morning, tripped and fell, and fractured  dislocated her left ankle, sustaining a bimalleolar fracture which is  closed.  She was brought to the emergency room in extreme deep, throbbing  pain with a deformity of the left lower extremity and absence of a dorsalis  pedis pulse.  She has a past medical history of neuropathy.   FAMILY HISTORY:  Noncontributory.   PRIMARY PHYSICIAN:  Dr. Ouida Sills.   DRUG ALLERGIES:  None.   MEDICATIONS:  1. B12 vitamins.  2. Calcium with vitamin D.  3. Amitriptyline 100 mg q.h.s.   PHYSICAL EXAMINATION:  VITAL SIGNS:  Weight is 165, pulse 76, blood pressure  116/69, respirations are unlabored.  HEENT:  Normal.  NECK:  Supple.  CHEST:  Clear.  HEART:  Rate and rhythm were normal.  ABDOMEN:  Soft.  No distension.  No tenderness.  EXTREMITIES:  The upper extremities had no injury.  Range of motion,  strength, stability, and alignment were normal.  She does have a tingling  sensation, but normal sensory findings to soft touch and sharp in the upper  extremities.  The left ankle was subluxated laterally.  There was no  palpable dorsalis pedis pulse.  There is a 2+ posterior tibial pulse.  She  could feel sharp and soft touch despite complaints of tingling and burning  in both feet.  Capillary refill was less than  two seconds.   Radiographs show bimalleolar fracture.  The medial malleolar fracture is  vertical.  There is some comminution there.  The fibular fracture is a Weber  B-type fracture in terms of the fibula.   In the emergency room she had a closed reduction and application of a  splint.  Post reduction films show the fracture to be reduced.  Pulse  returned after reduction.   PLAN:  The plan is for admission, open treatment internal fixation of the  left ankle with plate and screw construct.   The informed consent process has been completed.  We discussed the  diagnosis, treatment plan, alternative treatment, risks and benefits ratio  of this treatment versus alternative treatment, complication rates,  postoperative course, I answered all her questions, and she accepted the  plan of treatment.      Vickki Hearing, M.D.  Electronically Signed     SEH/MEDQ  D:  10/29/2005  T:  10/30/2005  Job:  [id number]

## 2010-06-10 NOTE — Discharge Summary (Signed)
[name], [name]                ACCOUNT NO.:  [id number]   MEDICAL RECORD NO.:  [id number]          PATIENT TYPE:  INP   LOCATION:  3038                         FACILITY:  [facility]   PHYSICIAN:  Casimiro Needle L. Reynolds, M.D.DATE OF BIRTH:  10/08/1940   DATE OF ADMISSION:  03/20/2008  DATE OF DISCHARGE:  03/21/2008                               DISCHARGE SUMMARY   ADMISSION DIAGNOSES:  1. Acute right-sided numbness, I suspect transient ischemic attack or      lacunar infarct in the left thalamus.  2. History of B12 deficiency with polyneuropathy.   DISCHARGE DIAGNOSES:  1. Acute transient right-sided numbness, resolved, question transient      ischemic attack, question migraine phenomenon.  2. History of B12 deficiency with polyneuropathy.   CONDITION ON DISCHARGE:  Improved.   DIET:  Regular.   ACTIVITY:  Ad lib.   DISCHARGE MEDICATIONS:  1. Lyrica 75 mg b.i.d.  2. Amitriptyline 100 mg at bedtime.  3. Aspirin daily.  4. Multivitamin daily.  5. Calcium with vitamin D daily.  6. B12 injection monthly.   STUDIES:  1. MRI of brain performed on March 20, 2008 with and without      contrast demonstrated mild atrophy and small vessel disease without      acute intracranial abnormality.  2. MRA of the intracranial circulation without contrast performed on      March 20, 2008, unremarkable.  3. MRA of the extracranial neck vessels with contrast performed on      March 20, 2008 demonstrated some tortuosity in the vessels but      no significant carotid or vertebral disease.   LABORATORY REVIEW:  CBC:  White count 5.5, hemoglobin 12.9, platelets  170,000.  CMET:  Normal, significant for a minimally elevated glucose of  100.  Coags normal.  Lipid panel remarkable for a total cholesterol of  186, HDL low at 35, and LDL elevated at 115.  Hemoglobin A1c 4.8.   HOSPITAL COURSE:  Please see admission H and P for full admission  details.  Briefly, this is a 70 year old woman in  good health who  experienced acute onset of right-sided numbness and presented to the  [facility] with the above complaint.  Because she was  considered a possible code stroke, she was transferred to [facility].  En route, her symptoms improved and on her arrival at [facility] she had an NIH stroke scale of zero.  She was admitted and  underwent MRI of the brain with MRA of the intracranial and extracranial  circulation, which was unremarkable.  Some of this could have been a  small vessel TIA or possibly a migraine or other phenomenon.  She was  advised to continue her aspirin, and will be started on a low-dose  statin for improved control of her cholesterol.  By the morning of  March 21, 2008, her symptoms had disappeared completely and she was  deemed stable for discharge.   She was advised to follow up with her primary physician, Dr. Ouida Sills, over  the next couple of weeks.  She  can follow up with [facility] on an as-needed basis.      Michael L. Thad Ranger, M.D.  Electronically Signed     MLR/MEDQ  D:  03/22/2008  T:  03/22/2008  Job:  [id number]

## 2010-06-10 NOTE — Group Therapy Note (Signed)
NAMEMYRL, BYNUM                ACCOUNT NO.:  [id number]   MEDICAL RECORD NO.:  [id number]           PATIENT TYPE:  INP   LOCATION:  A316                          FACILITY:  [facility]   PHYSICIAN:  Kingsley Callander. Ouida Sills, MD       DATE OF BIRTH:  1940-01-29   DATE OF PROCEDURE:  DATE OF DISCHARGE:  10/31/2005                                   PROGRESS NOTE   CHIEF COMPLAINT:  Ankle fracture.   HISTORY OF PRESENT ILLNESS:  This patient is a 70 year old white female who  presented to the emergency room by ambulance after falling in her home and  fracturing her left ankle.  She was taken to surgery yesterday, where open  treatment internal fixation was performed and she now has her left lower leg  in a splint and Ace wrap dressing.  The patient has underlying of peripheral  neuropathy related to B12 deficiency.  She receives monthly replacement  injections.  She is on amitriptyline chronically for neuropathy.  She failed  a trial of Lyrica earlier this year.  She has fallen in the past.  Previous  bone density measurements have shown no osteoporosis.   PAST MEDICAL HISTORY:  1. Peripheral neuropathy/B12 deficiency.  2. History of left rib fractures from a fall in 2006.  3. Hysterectomy in 1984.  4. Tubal ligation in 1978.  5. Left shoulder dislocation in 1996.   MEDICATIONS:  1. Amitriptyline 100 mg q.h.s.  2. Vitamin B12 1000 mcg IM monthly   ALLERGIES:  SHE IS INTOLERANT OF SULFA.   SOCIAL HISTORY:  She does not smoke, drink, or use recreational drugs.   FAMILY HISTORY:  Her mother has had coronary artery disease.  Her father  suffered from alcoholism, peptic ulcer disease,  and rheumatoid arthritis.  A brother has had rheumatoid arthritis.   REVIEW OF SYSTEMS:  No syncope, chest pain, shortness of breath, abdominal  pain or difficulty voiding.   PHYSICAL EXAMINATION:  VITAL SIGNS: Temperature 97.6, pulse 76, respirations  22, blood pressure 99/57 initially.  GENERAL: Alert and fully  oriented.  HEENT: Eyes and oropharynx appear normal.  NECK: No JVD or thyromegaly.  LUNGS: Clear.  HEART: Regular with no murmurs.  ABDOMEN: Nontender, No  hepatosplenomegaly.  EXTREMITIES: The left lower leg is in a splint.  Right  leg reveals no edema.  There is no clubbing or cyanosis of the toes.   LABORATORY DATA:  White count 8.2, hemoglobin 12.5, platelets 159,000.  Sodium 133, potassium 3.7, bicarb 23, glucose 101, BUN 16, creatinine 0.7,  calcium 8.   IMPRESSION:  1. Bimalleolar left ankle fracture status post open treatment internal      fixation.  She is doing well postoperatively.  She is on Lovenox.      Physical therapy has been consulted.  2. Peripheral neuropathy.  Continue amitriptyline.  Continue on monthly      B12 replacement.  She had a B12 level of 491 last August.   Will follow with you during her hospitalization.      Kingsley Callander. Ouida Sills, MD  Electronically  Signed     ROF/MEDQ  D:  10/30/2005  T:  11/01/2005  Job:  [id number]

## 2010-06-10 NOTE — Discharge Summary (Signed)
[name], [name]                ACCOUNT NO.:  [id number]   MEDICAL RECORD NO.:  [id number]          PATIENT TYPE:  INP   LOCATION:  A316                          FACILITY:  [facility]   PHYSICIAN:  Vickki Hearing, M.D.DATE OF BIRTH:  05-16-1940   DATE OF ADMISSION:  10/29/2005  DATE OF DISCHARGE:  10/09/2007LH                                 DISCHARGE SUMMARY   DISCHARGING PHYSICIAN:  Vickki Hearing, M.D.   ADMITTING DIAGNOSIS:  Bimalleolar ankle fracture with subluxation of the  ankle joint   DISCHARGE DIAGNOSIS:  Bimalleolar ankle fracture with subluxation of the  ankle joint.   PROCEDURES:  On 10/29/2005 this patient underwent open treatment internal  fixation of the left ankle after a closed reduction was done in the  emergency room.  This was done under spinal anesthetic with a Synthes small  fragment plate on the lateral side including inter-frag screw and two medial  malleolar screws were used with bone graft using local autograft and bone  chips allograft.   SURGEON:  Vickki Hearing, M.D.   OPERATIVE FINDINGS INCLUDED:  Supination-external-rotation type, oblique  lateral malleolus fracture in the [place] B category; and a medial malleolus  fracture with comminution, but no injury to the talus or plafond of the  distal tibia.   HISTORY AND HOSPITAL COURSE:  This is a 70 year old female who was in usual  state of health until October 6.  She became nauseous, had some vomiting,  felt sick all day; and was unable to keep any solid foods down.  She got up  on October 7th,  tripped and fell; and fractured, subluxated, or dislocated  her left ankle.  She was brought to the emergency room, evaluated there.  She was found to have a bimalleolar fracture with the talus subluxated from  the ankle mortis.  Closed reduction and application of splint was applied;  and she was taken to surgery as stated above.   After surgery medical consult was obtained with her primary  care physician,  Dr. Ouida Sills, to assist in the care of her medical conditions and perform  evaluation medically.   Of note, she did well including therapy sessions.  She had a splint changed  to a cast in slight plantar flexion on October 9 and was discharged home in  stable condition.   DISCHARGE MEDICATIONS INCLUDED:  1. Percocet one q.4 h. p.r.n. for pain.  2. She is resume amitriptyline and she was taking prior to coming to the      hospital.  3. Of note, this patient was treated with Lovenox for 2 days while in the      hospital after surgery.   CONDITION:  Improved.   DISPOSITION:  To home.  Follow up visit scheduled for Monday the 15th,  at  which time she will have her cast changed and we will bring her ankle joint  into a more neutral position.      Vickki Hearing, M.D.  Electronically Signed     SEH/MEDQ  D:  10/31/2005  T:  11/02/2005  Job:  [id number]

## 2010-06-10 NOTE — Op Note (Signed)
NAME:  [name], [name]                ACCOUNT NO.:  [id number]   MEDICAL RECORD NO.:  [id number]          PATIENT TYPE:  EMS   LOCATION:  ED                            FACILITY:  [facility]   PHYSICIAN:  Vickki Hearing, M.D.DATE OF BIRTH:  1941/01/15   DATE OF PROCEDURE:  DATE OF DISCHARGE:                                 OPERATIVE REPORT   Audio too short to transcribe (less than 5 seconds)      Vickki Hearing, M.D.     SEH/MEDQ  D:  10/29/2005  T:  10/29/2005  Job:  [id number]

## 2010-06-10 NOTE — Op Note (Signed)
[name], [name]                          ACCOUNT NO.:  [id number]   MEDICAL RECORD NO.:  [id number]                   PATIENT TYPE:  AMB   LOCATION:  DAY                                  FACILITY:  [facility]   PHYSICIAN:  R. Roetta Sessions, M.D.              DATE OF BIRTH:  05-12-1940   DATE OF PROCEDURE:  09/30/2003  DATE OF DISCHARGE:                                 OPERATIVE REPORT   PROCEDURE:  Colonoscopy with ileoscopy, biopsy, stool collection.   INDICATIONS FOR PROCEDURE:  The patient is a 70 year old lady referred by  Dr. Ouida Sills. She is here for colonoscopy. She has never had one. She has  tendency towards diarrhea the past 6 months which is unusual for this nice  lady. She has not had any rectal bleeding. No family history of colorectal  neoplasia. Colonoscopy is now being done in part for screening and in part  for further evaluation of diarrhea. This approach has been discussed with  the patient at length. Potential risks, benefits, and alternatives have been  reviewed and questions answered. Please see my handwritten H&P for more  information.   PROCEDURE NOTE:  O2 saturation, blood pressure, pulse, and respirations  monitored throughout the entirety of the procedure. Conscious sedation with  Versed 3 mg IV and Demerol 75 mg IV in divided doses.   INSTRUMENT:  Olympus video chip system.   FINDINGS:  Digital rectal examination revealed no abnormalities.   ENDOSCOPIC FINDINGS:  Prep was good.   Rectum:  Examination of the rectal mucosa including retroflexed view of anal  verge revealed no abnormalities.   Colon:  Colonic mucosa was surveyed from the rectosigmoid junction through  the left, transverse, and right colon to area of the appendiceal orifice,  ileocecal valve, and cecum. These structures were well seen and photographed  for the record. The terminal ileum was intubated for 5 cm. Olympus video  scope was slowly withdrawn, and all previously mentioned mucosal  surfaces  were again seen. The colonic mucosa appeared entirely normal except for some  small tiny areas of patchy erythema and friability at the ileocecal valve.  Attempted to intubate the terminal ileum a number of times. Finally, I was  successful. Slight friability of the very distal ileum, but this could be  related to scope problems. Certainly no erosions or ulcer craters or other  abnormalities seen. Biopsy of the ileocecal valve taken. Also segmental  biopsies of the rectum and sigmoid mucosa were taken. A stool sample was  collected. Again, however, the rectal mucosa and colonic mucosa otherwise  appeared generally normal. The patient tolerated the procedure well and was  reactive to endoscopy.   IMPRESSION:  1.  Normal rectum.  2.  Normal colon. Subtle abnormalities in the ileocecal valve and terminal      ileum as described above of doubtful clinical significance. The      remainder of the  colonic mucosa appeared normal. Biopsy of the ileocecal      valve, sigmoid, and rectal mucosa. Stool sample collected.   RECOMMENDATIONS:  Will follow up on biopsies and stool sampling. Will make  further recommendations in the near future.      ___________________________________________                                            [name], M.D.   RMR/MEDQ  D:  09/30/2003  T:  09/30/2003  Job:  [id number]

## 2011-02-24 ENCOUNTER — Other Ambulatory Visit (HOSPITAL_COMMUNITY): Payer: Self-pay | Admitting: Internal Medicine

## 2011-02-24 DIAGNOSIS — Z139 Encounter for screening, unspecified: Secondary | ICD-10-CM

## 2011-03-06 ENCOUNTER — Ambulatory Visit (HOSPITAL_COMMUNITY)
Admission: RE | Admit: 2011-03-06 | Discharge: 2011-03-06 | Disposition: A | Discharge status: Home / Self Care | Payer: Medicare Other | Source: Ambulatory Visit | Attending: Internal Medicine | Admitting: Internal Medicine

## 2011-03-06 DIAGNOSIS — Z1231 Encounter for screening mammogram for malignant neoplasm of breast: Secondary | ICD-10-CM | POA: Insufficient documentation

## 2011-03-06 DIAGNOSIS — Z139 Encounter for screening, unspecified: Secondary | ICD-10-CM

## 2011-05-11 ENCOUNTER — Inpatient Hospital Stay
Admission: RE | Admit: 2011-05-11 | Disposition: A | Discharge status: Home / Self Care | Payer: Self-pay | Source: Ambulatory Visit | Attending: Orthopedic Surgery | Admitting: Orthopedic Surgery

## 2011-05-11 ENCOUNTER — Encounter: Payer: Self-pay | Admitting: Orthopedic Surgery

## 2011-05-11 ENCOUNTER — Ambulatory Visit: Payer: Medicare Other

## 2011-05-11 ENCOUNTER — Ambulatory Visit (INDEPENDENT_AMBULATORY_CARE_PROVIDER_SITE_OTHER): Payer: Medicare Other

## 2011-05-11 ENCOUNTER — Ambulatory Visit (INDEPENDENT_AMBULATORY_CARE_PROVIDER_SITE_OTHER): Payer: Medicare Other | Admitting: Orthopedic Surgery

## 2011-05-11 VITALS — BP 100/50 | Ht 63.5 in | Wt 164.0 lb

## 2011-05-11 DIAGNOSIS — M1710 Unilateral primary osteoarthritis, unspecified knee: Secondary | ICD-10-CM

## 2011-05-11 MED ORDER — DICLOFENAC POTASSIUM 50 MG PO TABS: 50.0000 mg | ORAL_TABLET | Freq: Two times a day (BID) | ORAL | Status: DC

## 2011-05-11 NOTE — Patient Instructions (Signed)
Over the counter medication : apply max freeze to back of the knee 3 x a day   Start diclofenac 50 mg bid pick up from your pharmacy

## 2011-05-11 NOTE — Progress Notes (Signed)
  Subjective:    [name] is a 71 y.o. female who presents with continued pain behind her right knee she had a right knee arthroscopy with medial and lateral meniscectomies and chondroplasties of the medial lateral compartments on 05/01/2008 she comes in complaining of posterior knee pain and pain radiating into her thigh and hip. She has some discomfort going down the steps. Chest and back pain with some pulling in the back of the leg she has no pain when walking she does have some discomfort when getting out of a chair. She's taken some extra strength Tylenol when needed and that seems to ease the pain. She also hurts when she is lying in bed at night.  Review of systems she denies any real lumbar pain she is active she has no numbness or tingling in the leg and reports no weakness  Past Medical History  Diagnosis Date  . Hyperlipemia      Past Surgical History  Procedure Date  . Appendectomy      Physical Exam(12) GENERAL: normal development   CDV: pulses are normal   Skin: normal  Lymph: nodes were not palpable/normal  Psychiatric: awake, alert and oriented  Neuro: normal sensation  The medial lateral compartments are nontender. She has 120 of knee flexion with a slight flexion contracture she has adequate strength in her limb to skin contact the knee is stable the pulse and temperature are normal there is no lymphadenopathy there is normal sensation she has a negative straight leg raise and negative Lasegue's sign she does have some tenderness and tightness to the hamstring.  Impression x-ray shows valgus arthritis expected doesn't correlate with her symptoms  I suspect she either has a inflamed nerve or tight hamstring saw him recommending max reason diclofenac and a six-week followup to reassess the situation.

## 2011-05-12 ENCOUNTER — Other Ambulatory Visit: Payer: Self-pay | Admitting: Orthopedic Surgery

## 2011-05-12 DIAGNOSIS — M179 Osteoarthritis of knee, unspecified: Secondary | ICD-10-CM

## 2011-05-12 DIAGNOSIS — M1710 Unilateral primary osteoarthritis, unspecified knee: Secondary | ICD-10-CM

## 2011-06-22 ENCOUNTER — Ambulatory Visit (INDEPENDENT_AMBULATORY_CARE_PROVIDER_SITE_OTHER): Payer: Medicare Other | Admitting: Orthopedic Surgery

## 2011-06-22 ENCOUNTER — Encounter: Payer: Self-pay | Admitting: Orthopedic Surgery

## 2011-06-22 VITALS — BP 90/58 | Ht 63.5 in | Wt 164.0 lb

## 2011-06-22 DIAGNOSIS — M1710 Unilateral primary osteoarthritis, unspecified knee: Secondary | ICD-10-CM

## 2011-06-22 MED ORDER — DICLOFENAC POTASSIUM 50 MG PO TABS: 50.0000 mg | ORAL_TABLET | Freq: Two times a day (BID) | ORAL | Status: DC

## 2011-06-22 NOTE — Progress Notes (Signed)
Subjective:     Patient ID: [name], female   DOB: [birth year], 71 y.o.   MRN: [id number]  HPI [name] is a 71 y.o. female who presents with continued pain behind her right knee she had a right knee arthroscopy with medial and lateral meniscectomies and chondroplasties of the medial lateral compartments on 05/01/2008 she comes in complaining of posterior knee pain and pain radiating into her thigh and hip. She has some discomfort going down the steps.   She presents now for followup visit with fairly significant improvement. Still having some difficulty getting up from a seated position. She does have occasional radiating pain from her hip into her legs when she gets up in the morning   Review of Systems  Neurological: Positive for numbness.       Objective:   Physical Exam  Constitutional: She appears well-developed and well-nourished.  Musculoskeletal:       Right knee: She exhibits bony tenderness. She exhibits normal range of motion, no swelling, no effusion, no ecchymosis, no erythema, no LCL laxity, normal patellar mobility, normal meniscus and no MCL laxity. tenderness found.       Tenderness is noted in the posterior aspect of the knee joint in the popliteal fossa with a small Baker cyst. There is also a clicking iliotibial band, which is nontender. Muscle tone and strength are normal.  Neurological: She exhibits normal muscle tone.  Skin: Skin is warm and dry. No rash noted. No erythema. No pallor.  Psychiatric: She has a normal mood and affect. Her behavior is normal. Judgment and thought content normal.       Assessment:     RIGHT lower extremity pain, possible Baker cyst versus radiculopathy, RIGHT leg. Managing well with max, freeze and diclofenac 50 mg a day    Plan:     Continue present management and refill, diclofenac, followup as needed

## 2011-06-22 NOTE — Patient Instructions (Signed)
Continue present treatment

## 2011-09-11 ENCOUNTER — Other Ambulatory Visit: Payer: Self-pay | Admitting: *Deleted

## 2011-09-11 DIAGNOSIS — M1710 Unilateral primary osteoarthritis, unspecified knee: Secondary | ICD-10-CM

## 2011-09-11 MED ORDER — DICLOFENAC POTASSIUM 50 MG PO TABS: 50.0000 mg | ORAL_TABLET | Freq: Two times a day (BID) | ORAL | Status: DC

## 2012-02-14 ENCOUNTER — Other Ambulatory Visit (HOSPITAL_COMMUNITY): Payer: Self-pay | Admitting: Internal Medicine

## 2012-02-14 DIAGNOSIS — Z09 Encounter for follow-up examination after completed treatment for conditions other than malignant neoplasm: Secondary | ICD-10-CM

## 2012-03-11 ENCOUNTER — Ambulatory Visit (HOSPITAL_COMMUNITY)
Admission: RE | Admit: 2012-03-11 | Discharge: 2012-03-11 | Disposition: A | Discharge status: Home / Self Care | Payer: Medicare Other | Source: Ambulatory Visit | Attending: Internal Medicine | Admitting: Internal Medicine

## 2012-03-11 DIAGNOSIS — Z1231 Encounter for screening mammogram for malignant neoplasm of breast: Secondary | ICD-10-CM | POA: Insufficient documentation

## 2012-03-11 DIAGNOSIS — Z09 Encounter for follow-up examination after completed treatment for conditions other than malignant neoplasm: Secondary | ICD-10-CM

## 2012-05-23 ENCOUNTER — Other Ambulatory Visit: Payer: Self-pay | Admitting: *Deleted

## 2012-05-23 DIAGNOSIS — M1710 Unilateral primary osteoarthritis, unspecified knee: Secondary | ICD-10-CM

## 2012-05-23 MED ORDER — DICLOFENAC POTASSIUM 50 MG PO TABS: 50.0000 mg | ORAL_TABLET | Freq: Two times a day (BID) | ORAL | Status: DC

## 2012-08-22 ENCOUNTER — Other Ambulatory Visit: Payer: Self-pay | Admitting: *Deleted

## 2012-08-22 DIAGNOSIS — M1710 Unilateral primary osteoarthritis, unspecified knee: Secondary | ICD-10-CM

## 2012-08-22 MED ORDER — DICLOFENAC POTASSIUM 50 MG PO TABS: 50.0000 mg | ORAL_TABLET | Freq: Two times a day (BID) | ORAL | Status: DC

## 2013-02-17 ENCOUNTER — Other Ambulatory Visit (HOSPITAL_COMMUNITY): Payer: Self-pay | Admitting: Internal Medicine

## 2013-02-17 DIAGNOSIS — Z139 Encounter for screening, unspecified: Secondary | ICD-10-CM

## 2013-03-13 ENCOUNTER — Ambulatory Visit (HOSPITAL_COMMUNITY)
Admission: RE | Admit: 2013-03-13 | Discharge: 2013-03-13 | Disposition: A | Discharge status: Home / Self Care | Payer: Medicare Other | Source: Ambulatory Visit | Attending: Internal Medicine | Admitting: Internal Medicine

## 2013-03-13 DIAGNOSIS — Z1231 Encounter for screening mammogram for malignant neoplasm of breast: Secondary | ICD-10-CM | POA: Insufficient documentation

## 2013-03-13 DIAGNOSIS — Z139 Encounter for screening, unspecified: Secondary | ICD-10-CM

## 2013-04-08 ENCOUNTER — Other Ambulatory Visit: Payer: Self-pay | Admitting: *Deleted

## 2013-04-08 DIAGNOSIS — M1710 Unilateral primary osteoarthritis, unspecified knee: Secondary | ICD-10-CM

## 2013-04-08 DIAGNOSIS — M179 Osteoarthritis of knee, unspecified: Secondary | ICD-10-CM

## 2013-04-08 MED ORDER — DICLOFENAC POTASSIUM 50 MG PO TABS: 50.0000 mg | ORAL_TABLET | Freq: Two times a day (BID) | ORAL | Status: DC

## 2013-04-19 ENCOUNTER — Emergency Department (HOSPITAL_COMMUNITY)
Admission: EM | Admit: 2013-04-19 | Discharge: 2013-04-19 | Disposition: A | Discharge status: Home / Self Care | Payer: Medicare Other | Attending: Emergency Medicine | Admitting: Emergency Medicine

## 2013-04-19 ENCOUNTER — Encounter (HOSPITAL_COMMUNITY): Payer: Self-pay | Admitting: Emergency Medicine

## 2013-04-19 ENCOUNTER — Emergency Department (HOSPITAL_COMMUNITY): Payer: Medicare Other

## 2013-04-19 ENCOUNTER — Ambulatory Visit (HOSPITAL_COMMUNITY)
Admit: 2013-04-19 | Discharge: 2013-04-19 | Disposition: A | Discharge status: Home / Self Care | Payer: Medicare Other | Attending: Emergency Medicine | Admitting: Emergency Medicine

## 2013-04-19 DIAGNOSIS — Z7982 Long term (current) use of aspirin: Secondary | ICD-10-CM | POA: Insufficient documentation

## 2013-04-19 DIAGNOSIS — Z862 Personal history of diseases of the blood and blood-forming organs and certain disorders involving the immune mechanism: Secondary | ICD-10-CM | POA: Insufficient documentation

## 2013-04-19 DIAGNOSIS — Z8639 Personal history of other endocrine, nutritional and metabolic disease: Secondary | ICD-10-CM | POA: Insufficient documentation

## 2013-04-19 DIAGNOSIS — R4789 Other speech disturbances: Secondary | ICD-10-CM | POA: Insufficient documentation

## 2013-04-19 DIAGNOSIS — R110 Nausea: Secondary | ICD-10-CM | POA: Insufficient documentation

## 2013-04-19 DIAGNOSIS — R4781 Slurred speech: Secondary | ICD-10-CM

## 2013-04-19 DIAGNOSIS — Z79899 Other long term (current) drug therapy: Secondary | ICD-10-CM | POA: Insufficient documentation

## 2013-04-19 DIAGNOSIS — R51 Headache: Secondary | ICD-10-CM | POA: Insufficient documentation

## 2013-04-19 DIAGNOSIS — R55 Syncope and collapse: Secondary | ICD-10-CM

## 2013-04-19 LAB — COMPREHENSIVE METABOLIC PANEL
ALBUMIN: 4.2 g/dL (ref 3.5–5.2)
ALK PHOS: 82 U/L (ref 39–117)
ALT: 42 U/L — ABNORMAL HIGH (ref 0–35)
AST: 36 U/L (ref 0–37)
BILIRUBIN TOTAL: 0.8 mg/dL (ref 0.3–1.2)
BUN: 21 mg/dL (ref 6–23)
CO2: 28 mEq/L (ref 19–32)
Calcium: 9.6 mg/dL (ref 8.4–10.5)
Chloride: 102 mEq/L (ref 96–112)
Creatinine, Ser: 0.78 mg/dL (ref 0.50–1.10)
GFR calc Af Amer: >90 mL/min (ref >90)
GFR calc non Af Amer: 82 mL/min — ABNORMAL LOW (ref >90)
Glucose, Bld: 108 mg/dL — ABNORMAL HIGH (ref 70–99)
POTASSIUM: 4.6 meq/L (ref 3.7–5.3)
Sodium: 140 mEq/L (ref 137–147)
TOTAL PROTEIN: 7.1 g/dL (ref 6.0–8.3)

## 2013-04-19 LAB — URINALYSIS, ROUTINE W REFLEX MICROSCOPIC
Bilirubin Urine: NEGATIVE
Glucose, UA: NEGATIVE mg/dL
Hgb urine dipstick: NEGATIVE
Ketones, ur: NEGATIVE mg/dL
Leukocytes, UA: NEGATIVE
Nitrite: NEGATIVE
PH: 6.5 (ref 5.0–8.0)
Protein, ur: NEGATIVE mg/dL
Urobilinogen, UA: 0.2 mg/dL (ref 0.0–1.0)

## 2013-04-19 LAB — CBC WITH DIFFERENTIAL/PLATELET
BASOS ABS: 0 10*3/uL (ref 0.0–0.1)
BASOS PCT: 0 % (ref 0–1)
Eosinophils Absolute: 0.2 10*3/uL (ref 0.0–0.7)
Eosinophils Relative: 3 % (ref 0–5)
HCT: 39.4 % (ref 36.0–46.0)
HEMOGLOBIN: 14 g/dL (ref 12.0–15.0)
Lymphocytes Relative: 24 % (ref 12–46)
Lymphs Abs: 1.5 10*3/uL (ref 0.7–4.0)
MCH: 32.6 pg (ref 26.0–34.0)
MCHC: 35.5 g/dL (ref 30.0–36.0)
MCV: 91.8 fL (ref 78.0–100.0)
MONOS PCT: 5 % (ref 3–12)
Monocytes Absolute: 0.3 10*3/uL (ref 0.1–1.0)
NEUTROS ABS: 4.2 10*3/uL (ref 1.7–7.7)
NEUTROS PCT: 68 % (ref 43–77)
PLATELETS: 184 10*3/uL (ref 150–400)
RBC: 4.29 MIL/uL (ref 3.87–5.11)
RDW: 13.5 % (ref 11.5–15.5)
WBC: 6.2 10*3/uL (ref 4.0–10.5)

## 2013-04-19 LAB — TROPONIN I: Troponin I: <0.3 ng/mL (ref <0.30)

## 2013-04-19 LAB — CBG MONITORING, ED: Glucose-Capillary: 92 mg/dL (ref 70–99)

## 2013-04-19 MED ORDER — OXYCODONE-ACETAMINOPHEN 5-325 MG PO TABS
1.0000 | ORAL_TABLET | Freq: Once | ORAL | Status: DC
  Filled 2013-04-19: qty 1

## 2013-04-19 NOTE — ED Notes (Signed)
Patient would like something to drink. MD made aware.

## 2013-04-19 NOTE — ED Notes (Signed)
Pt to CT

## 2013-04-19 NOTE — Discharge Instructions (Signed)
Go home and rest. You will be called on Monday to get your outpatient echocardiogram done to look at the inside of your heart and heart valves (it is a noninvasive test). Have Dr Ouida SillsFagan recheck you this week. Return if you feel worse again.

## 2013-04-19 NOTE — ED Provider Notes (Signed)
CSN: [id number]     Arrival date & time 04/19/13  1041 History  This chart was scribed for Ward Givens, MD by Quintella Reichert, ED scribe.  This patient was seen in room [facility] and the patient's care was started at 1:19 PM.   Chief Complaint  Patient presents with  . Neck Pain    The history is provided by the patient. No language interpreter was used.    HPI Comments: [name] is a 73 y.o. female who presents to the Emergency Department complaining of sudden-onset neck pain that began this morning, with associated nausea, lightheadedness, and possible transient slurred speech.  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  Her friend who is a nurse witnessed this and informed family that at this time pt "wasn't talking coherently and her speech was slurred," although pt herself was not aware of any difficulty speaking.  Pt also felt nauseated and lightheaded "like I was going to pass out."  She denies diaphoresis, anterior neck pain, chest pain, arm pain, or lower back pain.  She denies missing a meal or concern for dehydration or over-heating.  Pt sat down on the back of a truck and ate a donut hole and drank some tang but continued to feel unwell.  Pt's nurse friend was with her and did the mini stroke evaluation which appeared intact.  Her family arrived 10 minutes after symptoms began and by that time her speech was normal.  She appeared pale but was not red or sweaty.  She was able to walk to the Beard with some assistance from family.  Since arrival to the ED she states her neck pain has persisted but her other symptoms have improved significantly.  Pt denies prior h/o similar symptoms.  She does admit to a prior episode of sudden-onset facial numbness for which she was hospitalized overnight and received brain  imaging.  She was told that imaging did not reveal any abnormalities but a TIA could not be ruled out.  She has been taking one baby aspirin/day since then.  She denies family h/o stroke.  She admits to h/o maternal and paternal heart problems.  Pt has a remote h/o smoking but stopped at age 52.  Pt denies prior h/o neck pain.  She states she has been eating normally.   Pt does not smoke or drink.  She lives at home alone.  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  Pt is right-handed.  PCP is Carylon Perches, MD    Past Medical History  Diagnosis Date  . 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 Appendectomy      No family history on file.   History  Substance Use Topics  . Smoking status: Never Smoker   . Smokeless tobacco: Not on file  . Alcohol Use: No  lives at home Lives alone  OB History   Grav Para Term Preterm Abortions TAB SAB Ect Mult Living                   Review of Systems  Constitutional: Negative for diaphoresis.  Cardiovascular: Negative for chest pain.  Gastrointestinal: Positive for nausea.  Musculoskeletal: Positive for neck pain.  Negative for back pain.  Neurological: Positive for speech difficulty and light-headedness.  All other systems reviewed and are negative.      Allergies  Sulfa antibiotics  Home Medications   Current Outpatient Rx  Name  Route  Sig  Dispense  Refill  . amitriptyline (ELAVIL) 100 MG tablet   Oral   Take 100 mg by mouth at bedtime.         Marland Kitchen aspirin EC 81 MG tablet   Oral   Take 81 mg by mouth daily.         . Calcium Carbonate-Vitamin D (CALCIUM 600 + D PO)   Oral   Take 1 tablet by mouth daily.          . diclofenac (CATAFLAM) 50 MG tablet   Oral   Take 50 mg by mouth daily.         Marland Kitchen gabapentin (NEURONTIN) 300 MG capsule   Oral   Take 300 mg by mouth 2 (two)  times daily.         . Multiple Vitamin (MULTIVITAMIN) capsule   Oral   Take 1 capsule by mouth daily.         . simvastatin (ZOCOR) 10 MG tablet   Oral   Take 10 mg by mouth at bedtime.          BP 134/59  Pulse 68  Temp(Src) 97.8 F (36.6 C) (Oral)  Resp 20  Ht 5\' 1"  (1.549 m)  Wt 162 lb (73.483 kg)  BMI 30.63 kg/m2  SpO2 97%  Vital signs normal   Orthostatic vital signs are normal   Physical Exam  Nursing note and vitals reviewed. Constitutional: She is oriented to person, place, and time. She appears well-developed and well-nourished.  Non-toxic appearance. She does not appear ill. No distress.  HENT:  Head: Normocephalic and atraumatic.  Right Ear: External ear normal.  Left Ear: External ear normal.  Nose: Nose normal. No mucosal edema or rhinorrhea.  Mouth/Throat: Oropharynx is clear and moist and mucous membranes are normal. No dental abscesses or uvula swelling.  Eyes: Conjunctivae and EOM are normal. Pupils are equal, round, and reactive to light.  Neck: Normal range of motion and full passive range of motion without pain. Neck supple.  Cardiovascular: Normal rate, regular rhythm and normal heart sounds.  Exam reveals no gallop and no friction rub.   No murmur heard. Pulmonary/Chest: Effort normal and breath sounds normal. No respiratory distress. She has no wheezes. She has no rhonchi. She has no rales. She exhibits no tenderness and no crepitus.  Abdominal: Soft. Normal appearance and bowel sounds are normal. She exhibits no distension. There is no tenderness. There is no rebound and no guarding.  Musculoskeletal: Normal range of motion. She exhibits no edema and no tenderness.  Moves all extremities well.   Neurological: She is alert and oriented to person, place, and time. No cranial nerve deficit.  Grips mildly weak on right Finger-to-nose normal bilaterally Heel-to-shin normal and coordinated bilaterally Pronator drift negative  Skin: Skin is  warm, dry and intact. No rash noted. No erythema. No pallor.  Psychiatric: She has a normal mood and affect. Her speech is normal and behavior is normal. Her mood appears not anxious.    ED Course  Procedures (including critical care time)  DIAGNOSTIC STUDIES: Oxygen Saturation is 97% on room air, normal by my interpretation.    COORDINATION OF CARE: 1:27 PM-Discussed treatment plan which includes labs and imaging with pt at  bedside and pt agreed to plan.   Review of her prior records shows patient was admitted in February 2010 for right-sided numbness. It started to improve while she was in the ED. [facility] and had MRA/MRI of her neck and brain. They were felt to be normal. She was admitted overnight. It was felt by the neurologist she may have had a mild TIA/lacunar infarct. She was maintained on ASA 81 mg a day.   3:25 PM-Consult complete with Dr. Cyril Mourningamillo, neurologist. Patient case explained and discussed. He recommends getting an outpatient MRI today. [facility]. If negative she can be discharged home. If abnormal [facility].   15:43 D[facility], states [facility] and she will work her in, she has my number, if her scan is + I will arrange admission [facility], if negative she is to return to our ED and I will schedule for outpatient echo which she has not had done before.   Discussed plan with patient and her daughter. Her daughter is going to drive [facility]. Pt left at 16:00  18:45 MRI has resulted and is normal.  19:30 pt returns to [facility] ED for discharge. Outpatient Echo has been ordered, she will be called on Monday, March 30th with appt time. Pt had normal MRI/MRA of her neck and brain in 2010. She is to see Dr Ouida SillsFagan this week, rest this weekend, return if she feels worse.    Results for orders placed during the hospital encounter of 04/19/13  CBC WITH DIFFERENTIAL      Result Value Ref Range    WBC 6.2  4.0 - 10.5 K/uL   RBC 4.29  3.87 - 5.11 MIL/uL   Hemoglobin 14.0  12.0 - 15.0 g/dL   HCT 16.139.4  09.636.0 - 04.546.0 %   MCV 91.8  78.0 - 100.0 fL   MCH 32.6  26.0 - 34.0 pg   MCHC 35.5  30.0 - 36.0 g/dL   RDW 40.913.5  81.111.5 - 91.415.5 %   Platelets 184  150 - 400 K/uL   Neutrophils Relative % 68  43 - 77 %   Neutro Abs 4.2  1.7 - 7.7 K/uL   Lymphocytes Relative 24  12 - 46 %   Lymphs Abs 1.5  0.7 - 4.0 K/uL   Monocytes Relative 5  3 - 12 %   Monocytes Absolute 0.3  0.1 - 1.0 K/uL   Eosinophils Relative 3  0 - 5 %   Eosinophils Absolute 0.2  0.0 - 0.7 K/uL   Basophils Relative 0  0 - 1 %   Basophils Absolute 0.0  0.0 - 0.1 K/uL  COMPREHENSIVE METABOLIC PANEL      Result Value Ref Range   Sodium 140  137 - 147 mEq/L   Potassium 4.6  3.7 - 5.3 mEq/L   Chloride 102  96 - 112 mEq/L   CO2 28  19 - 32 mEq/L   Glucose, Bld 108 (*) 70 - 99 mg/dL   BUN 21  6 - 23 mg/dL   Creatinine, Ser 7.820.78  0.50 - 1.10 mg/dL   Calcium 9.6  8.4 - 95.610.5 mg/dL   Total Protein 7.1  6.0 - 8.3 g/dL   Albumin 4.2  3.5 - 5.2 g/dL   AST 36  0 - 37 U/L   ALT 42 (*) 0 - 35  U/L   Alkaline Phosphatase 82  39 - 117 U/L   Total Bilirubin 0.8  0.3 - 1.2 mg/dL   GFR calc non Af Amer 82 (*) >90 mL/min   GFR calc Af Amer >90  >90 mL/min  TROPONIN I      Result Value Ref Range   Troponin I <0.30  <0.30 ng/mL  URINALYSIS, ROUTINE W REFLEX MICROSCOPIC      Result Value Ref Range   Color, Urine YELLOW  YELLOW   APPearance CLEAR  CLEAR   Specific Gravity, Urine <1.005 (*) 1.005 - 1.030   pH 6.5  5.0 - 8.0   Glucose, UA NEGATIVE  NEGATIVE mg/dL   Hgb urine dipstick NEGATIVE  NEGATIVE   Bilirubin Urine NEGATIVE  NEGATIVE   Ketones, ur NEGATIVE  NEGATIVE mg/dL   Protein, ur NEGATIVE  NEGATIVE mg/dL   Urobilinogen, UA 0.2  0.0 - 1.0 mg/dL   Nitrite NEGATIVE  NEGATIVE   Leukocytes, UA NEGATIVE  NEGATIVE  CBG MONITORING, ED      Result Value Ref Range   Glucose-Capillary 92  70 - 99 mg/dL   Laboratory interpretation  all normal    Ct Head Wo Contrast Ct Cervical Spine Wo Contrast  04/19/2013   CLINICAL DATA:  Sudden onset neck pain this morning with nausea, lightheadedness and possible transient slurred speech.  EXAM: CT HEAD WITHOUT CONTRAST  CT CERVICAL SPINE WITHOUT CONTRAST  TECHNIQUE: Multidetector CT imaging of the head and cervical spine was performed following the standard protocol without intravenous contrast. Multiplanar CT image reconstructions of the cervical spine were also generated.  COMPARISON:  Brain MR dated 03/20/2008 and head CT dated 03/20/2008.  FINDINGS: CT HEAD FINDINGS  The cerebral hemispheres and posterior fossa structures continue to have normal appearances. The ventricles remain normal in size and position. No intracranial hemorrhage, mass lesion or CT evidence of acute infarction. Mild bilateral posterior ethmoid sinus mucosal thickening.  CT CERVICAL SPINE FINDINGS  Mild reversal of the normal cervical lordosis. Facet degenerative changes throughout the cervical spine with associated minimal anterolisthesis at the C4-5 level and C5-6 level. Mild anterior and posterior spur formation at multiple levels.  IMPRESSION: 1. No acute abnormality. 2. Mild cervical spine degenerative changes and mild reversal of the normal cervical lordosis. 3. Mild chronic bilateral ethmoid sinusitis.   Electronically Signed   By: Gordan Payment M.D.   On: 04/19/2013 14:25   Mr Brain Wo Contrast  04/19/2013   CLINICAL DATA:  73 year old female with episode of dizziness, slurred speech. Initial encounter.  EXAM: MRI HEAD WITHOUT CONTRAST  TECHNIQUE: Multiplanar, multiecho pulse sequences of the brain and surrounding structures were obtained without intravenous contrast.  COMPARISON:  Head CT without contrast 1404 hr the same day. Brain MRI 03/20/2008.  FINDINGS: No restricted diffusion to suggest acute infarction. No midline shift, mass effect, evidence of mass lesion, ventriculomegaly, extra-axial collection or acute  intracranial hemorrhage. Cervicomedullary junction and pituitary are within normal limits. Stable, negative visualized cervical spine. Normal bone marrow signal.  Major intracranial vascular flow voids are stable. Cerebral volume is not significantly changed. No cortical encephalomalacia. Mild for age nonspecific cerebral white matter T2 and FLAIR hyperintensity is not significantly changed allowing for differences in technique. Deep gray matter nuclei and brainstem are within normal limits for age. Chronic micro hemorrhage in the right cerebellar nuclei, otherwise the cerebellum is normal for age.  Visible internal auditory structures appear normal. Mastoids are clear. Minor paranasal sinus mucosal thickening. Postoperative changes to the  globes since 2010. Visualized scalp soft tissues are within normal limits.  IMPRESSION: 1.  No acute intracranial abnormality. 2. 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   Electronically Signed   By: Augusto Gamble M.D.   On: 04/19/2013 18:12     EKG Interpretation   Date/Time:  Saturday April 19 2013 11:26:24 EDT Ventricular Rate:  65 PR Interval:  154 QRS Duration: 88 QT Interval:  424 QTC Calculation: 440 R Axis:   33 Text Interpretation:  Normal sinus rhythm Normal ECG When compared with  ECG of 02-Jun-2008 18:55, Criteria for Anterior infarct are no longer  Present Nonspecific T wave abnormality no longer evident in Anterior leads  Confirmed by Kiyoto Slomski  MD-I, Aramis Zobel ([id number]) on 04/19/2013 1:36:05 PM      MDM   patient presents with near syncope while standing for an hour in the cold (today for high within the high 30s which was a big drop from the temperature the last few days). When she felt near syncopal she did not lay down but continued to sit up. She had an episode of slurred speech. She did eat a doughnut hole in her symptoms slowly improved. She may have had a brief episode of hypoglycemia or vasovagal episode. Due  to the slurred speech MRI was done which does not show acute neurological event. She had a prior episode of numbness about 5 years ago. Patient is being discharged home to followup with her PCP.    Final diagnoses:  Near syncope  Slurred speech    Plan discharge  Devoria Albe, MD, FACEP   I personally performed the services described in this documentation, which was scribed in my presence. The recorded information has been reviewed and considered.     Ward Givens, MD 04/19/13 [phone]

## 2013-04-19 NOTE — ED Notes (Signed)
Talked with MRI. Stated pt has already left and instructed to come back to [facility] ED

## 2013-04-19 NOTE — ED Notes (Signed)
Pt arrives with complaints of neck and back pain that started this morning. Hx of "mini strokes." States she was standing this morning when pain suddenly appeared. Pain located in rear L shoulder near neck. Denies radiation. Pain 7/10. Aching/throbbing. A/Ox4, skin warm and dry, normal color for race.

## 2013-04-19 NOTE — ED Notes (Signed)
Pt states she was helping to hide Easter eggs this morning. States her neck started hurting and she got dizzy. Also, complain of some nausea. Denies injury

## 2013-05-19 ENCOUNTER — Ambulatory Visit (INDEPENDENT_AMBULATORY_CARE_PROVIDER_SITE_OTHER): Payer: Medicare Other | Admitting: Orthopedic Surgery

## 2013-05-19 ENCOUNTER — Encounter: Payer: Self-pay | Admitting: Orthopedic Surgery

## 2013-05-19 ENCOUNTER — Ambulatory Visit (INDEPENDENT_AMBULATORY_CARE_PROVIDER_SITE_OTHER): Payer: Medicare Other

## 2013-05-19 VITALS — BP 133/79 | Ht 61.0 in | Wt 163.0 lb

## 2013-05-19 DIAGNOSIS — S93409A Sprain of unspecified ligament of unspecified ankle, initial encounter: Secondary | ICD-10-CM

## 2013-05-19 DIAGNOSIS — S96912A Strain of unspecified muscle and tendon at ankle and foot level, left foot, initial encounter: Secondary | ICD-10-CM

## 2013-05-19 DIAGNOSIS — M19079 Primary osteoarthritis, unspecified ankle and foot: Secondary | ICD-10-CM

## 2013-05-19 DIAGNOSIS — M25572 Pain in left ankle and joints of left foot: Secondary | ICD-10-CM

## 2013-05-19 DIAGNOSIS — M25579 Pain in unspecified ankle and joints of unspecified foot: Secondary | ICD-10-CM

## 2013-05-19 NOTE — Patient Instructions (Signed)
Ice 3 times a day No exercise x 3 weeks

## 2013-05-19 NOTE — Progress Notes (Signed)
Patient ID: Leitha BleakMary P Froemming, female   DOB: Feb 24, 1940, 73 y.o.   MRN: [id number]  Established patient new problem  Chief Complaint  Patient presents with  . Ankle Pain    Left ankle pain and swelling    73 year old female had open treatment internal fixation of the left ankle back in I believe 2007. She was doing some aerobics exercises in the next day started having some lateral ankle pain presents for evaluation and treatment. The pain is over the plate and also over the anterior talofibular ligament associated with dull throbbing 5/10 pain some numbness and tingling and some swelling. Symptoms present for one week.  Review of systems numbness tingling joint pain and stiffness also has some chronic left knee arthritis previously addressed. Remaining review of systems normal  Past Medical History  Diagnosis Date  . Hyperlipemia    BP 133/79  Ht 5\' 1"  (1.549 m)  Wt 163 lb (73.936 kg)  BMI 30.81 kg/m2 General appearance is normal, the patient is alert and oriented x3 with normal mood and affect. Ambulation without support may be a slight limp  As far as the ankle goes incision is healed well. On the medial lateral side. Mild swelling and tenderness over the plate as well as the anterior talofibular ligament. Slight decreased range of motion but the ankle is stable. He version plantar flexion dorsiflexion inversion strength are normal. The dorsalis pedis pulse is normal and there are no sensory changes  X-ray shows mild arthritis with intact hardware without complication.  Impression Encounter Diagnoses  Name Primary?  . Left ankle pain Yes  . Left ankle strain   . Ankle arthritis     Plan ASO brace Decrease activities Resume diclofenac twice a day instead of once a day Return in 3 weeks to reevaluate

## 2013-06-12 ENCOUNTER — Encounter: Payer: Self-pay | Admitting: Orthopedic Surgery

## 2013-06-12 ENCOUNTER — Ambulatory Visit (INDEPENDENT_AMBULATORY_CARE_PROVIDER_SITE_OTHER): Payer: Medicare Other | Admitting: Orthopedic Surgery

## 2013-06-12 VITALS — BP 133/70 | Ht 61.0 in | Wt 163.0 lb

## 2013-06-12 DIAGNOSIS — M25579 Pain in unspecified ankle and joints of unspecified foot: Secondary | ICD-10-CM

## 2013-06-12 DIAGNOSIS — M25572 Pain in left ankle and joints of left foot: Secondary | ICD-10-CM

## 2013-06-12 DIAGNOSIS — S96912A Strain of unspecified muscle and tendon at ankle and foot level, left foot, initial encounter: Secondary | ICD-10-CM

## 2013-06-12 DIAGNOSIS — S93409A Sprain of unspecified ligament of unspecified ankle, initial encounter: Secondary | ICD-10-CM

## 2013-06-12 NOTE — Progress Notes (Signed)
Patient ID: Leitha BleakMary P Blok, female   DOB: May 28, 1940, 73 y.o.   MRN: [id number]  Recheck left ankle  Status post open treatment internal fixation left ankle in 2007 developed pain and swelling after aerobic classes.  We treated her with an ankle brace and decreased activity she reports her pain is resolved  Review of systems no catching locking or giving way the ankle  BP 133/70  Ht 5\' 1"  (1.549 m)  Wt 163 lb (73.936 kg)  BMI 30.81 kg/m2  General appearance is normal, the patient is alert and oriented x3 with normal mood and affect. The ankle incisions are normal. Her range of motion is normal. There is no tenderness or swelling.  Encounter Diagnoses  Name Primary?  . Left ankle pain   . Left ankle strain Yes    Call or return to clinic prn if these symptoms worsen or fail to improve as anticipated.

## 2013-08-14 ENCOUNTER — Other Ambulatory Visit: Payer: Self-pay | Admitting: *Deleted

## 2013-08-14 MED ORDER — DICLOFENAC POTASSIUM 50 MG PO TABS: 50.0000 mg | ORAL_TABLET | Freq: Two times a day (BID) | ORAL | Status: DC

## 2013-09-01 ENCOUNTER — Encounter: Payer: Self-pay | Admitting: Internal Medicine

## 2013-10-08 ENCOUNTER — Encounter: Payer: Self-pay | Admitting: Gastroenterology

## 2013-10-08 ENCOUNTER — Ambulatory Visit (INDEPENDENT_AMBULATORY_CARE_PROVIDER_SITE_OTHER): Payer: Medicare Other | Admitting: Gastroenterology

## 2013-10-08 ENCOUNTER — Other Ambulatory Visit: Payer: Self-pay

## 2013-10-08 VITALS — BP 130/69 | HR 76 | Temp 96.9°F | Ht 62.0 in | Wt 159.8 lb

## 2013-10-08 DIAGNOSIS — Z1211 Encounter for screening for malignant neoplasm of colon: Secondary | ICD-10-CM | POA: Insufficient documentation

## 2013-10-08 MED ORDER — PEG-KCL-NACL-NASULF-NA ASC-C 100 G PO SOLR
1.0000 | ORAL | Status: DC
Start: 2013-10-08 — End: 2013-10-21

## 2013-10-08 NOTE — Assessment & Plan Note (Signed)
Due for screening colonoscopy. Patient is interested in pursuing.  I have discussed the risks, alternatives, benefits with regards to but not limited to the risk of reaction to medication, bleeding, infection, perforation and the patient is agreeable to proceed. Written consent to be obtained.

## 2013-10-08 NOTE — Patient Instructions (Signed)
Colonoscopy as scheduled. See separate instructions.  

## 2013-10-08 NOTE — Progress Notes (Signed)
Primary Care Physician:  Asencion Noble, MD  Primary Gastroenterologist:  Garfield Cornea, MD   Chief Complaint  Patient presents with  . Colonoscopy    ov to determin if tcs is needed- pt doing well    HPI:  [name] is a 73 y.o. female here to discuss possibility of colonoscopy. Last colonoscopy September 2005. No family history of colon polyps or colon cancer. She had normal rectum and colon. There was subtle abnormalities in the ileocecal valve and terminal ileum. Biopsies were obtained but they are not available to me at this time. Clinically she is doing well. Denies any bowel issues. No blood in the stool or melena. No heartburn, vomiting, dysphagia, unintentional weight loss.   Current Outpatient Prescriptions  Medication Sig Dispense Refill  . amitriptyline (ELAVIL) 100 MG tablet Take 100 mg by mouth at bedtime.      Marland Kitchen aspirin EC 81 MG tablet Take 81 mg by mouth daily.      . Calcium Carbonate-Vitamin D (CALCIUM 600 + D PO) Take 1 tablet by mouth daily.       . diclofenac (CATAFLAM) 50 MG tablet Take 1 tablet (50 mg total) by mouth 2 (two) times daily.  60 tablet  6  . gabapentin (NEURONTIN) 300 MG capsule Take 300 mg by mouth 2 (two) times daily.      . Multiple Vitamin (MULTIVITAMIN) capsule Take 1 capsule by mouth daily.      . simvastatin (ZOCOR) 10 MG tablet Take 10 mg by mouth at bedtime.      . peg 3350 powder (MOVIPREP) 100 G SOLR Take 1 kit (200 g total) by mouth as directed.  1 kit  0   No current facility-administered medications for this visit.    Allergies as of 10/08/2013 - Review Complete 10/08/2013  Allergen Reaction Noted  . Sulfa antibiotics  05/11/2011    Past Medical History  Diagnosis Date  . Hyperlipemia   . B12 deficiency     managed by Dr. Willey Blade: monthly injections    Past Surgical History  Procedure Laterality Date  . Appendectomy    . Colonoscopy  09/30/2003    RMR: Normal colon. Subtle abnormalities in the ileocecal valve and terminal   ileum as described above of doubtful clinical significance/Normal rectum  . Ankle fracture surgery      left  . Right leg fracture surgery      right    Family History  Problem Relation Age of Onset  . Colon cancer Neg Hx   . Colon polyps Neg Hx   . Breast cancer Neg Hx   . Lung cancer Neg Hx   . Heart disease Daughter     transposition of great arteries/on fourth pacemaker    History   Social History  . Marital Status: Widowed    Spouse Name: N/A    Number of Children: 1  . Years of Education: N/A   Occupational History  . Not on file.   Social History Main Topics  . Smoking status: Never Smoker   . Smokeless tobacco: Not on file  . Alcohol Use: No  . Drug Use: No  . Sexual Activity: Not on file   Other Topics Concern  . Not on file   Social History Narrative  . No narrative on file      ROS:  General: Negative for anorexia, weight loss, fever, chills, fatigue, weakness. Eyes: Negative for vision changes.  ENT: Negative for hoarseness, difficulty swallowing , nasal congestion.  CV: Negative for chest pain, angina, palpitations, dyspnea on exertion, peripheral edema.  Respiratory: Negative for dyspnea at rest, dyspnea on exertion, cough, sputum, wheezing.  GI: See history of present illness. GU:  Negative for dysuria, hematuria, urinary incontinence, urinary frequency, nocturnal urination.  MS: Negative for joint pain, low back pain.  Derm: Negative for rash or itching.  Neuro: Negative for weakness, abnormal sensation, seizure, frequent headaches, memory loss, confusion.  Psych: Negative for anxiety, depression, suicidal ideation, hallucinations.  Endo: Negative for unusual weight change.  Heme: Negative for bruising or bleeding. Allergy: Negative for rash or hives.    Physical Examination:  BP 130/69  Pulse 76  Temp(Src) 96.9 F (36.1 C) (Oral)  Ht _0  (1.575 m)  Wt 159 lb 12.8 oz (72.485 kg)  BMI 29.22 kg/m2   General: Well-nourished,  well-developed in no acute distress.  Head: Normocephalic, atraumatic.   Eyes: Conjunctiva pink, no icterus. Mouth: Oropharyngeal mucosa moist and pink , no lesions erythema or exudate. Neck: Supple without thyromegaly, masses, or lymphadenopathy.  Lungs: Clear to auscultation bilaterally.  Heart: Regular rate and rhythm, no murmurs rubs or gallops.  Abdomen: Bowel sounds are normal, nontender, nondistended, no hepatosplenomegaly or masses, no abdominal bruits or    hernia , no rebound or guarding.   Rectal: Deferred Extremities: No lower extremity edema. No clubbing or deformities.  Neuro: Alert and oriented x 4 , grossly normal neurologically.  Skin: Warm and dry, no rash or jaundice.   Psych: Alert and cooperative, normal mood and affect.  Labs: Lab Results  Component Value Date   WBC 6.2 04/19/2013   HGB 14.0 04/19/2013   HCT 39.4 04/19/2013   MCV 91.8 04/19/2013   PLT 184 04/19/2013   Lab Results  Component Value Date   ALT 42* 04/19/2013   AST 36 04/19/2013   ALKPHOS 82 04/19/2013   BILITOT 0.8 04/19/2013   Lab Results  Component Value Date   CREATININE 0.78 04/19/2013   BUN 21 04/19/2013   NA 140 04/19/2013   K 4.6 04/19/2013   CL 102 04/19/2013   CO2 28 04/19/2013     Imaging Studies: No results found.

## 2013-10-09 ENCOUNTER — Other Ambulatory Visit: Payer: Self-pay

## 2013-10-09 DIAGNOSIS — Z1211 Encounter for screening for malignant neoplasm of colon: Secondary | ICD-10-CM

## 2013-10-14 NOTE — Progress Notes (Signed)
cc'ed to pcp °

## 2013-10-21 ENCOUNTER — Encounter (HOSPITAL_COMMUNITY): Payer: Self-pay | Admitting: Pharmacy Technician

## 2013-11-03 ENCOUNTER — Encounter (HOSPITAL_COMMUNITY)
Admission: RE | Disposition: A | Discharge status: Home / Self Care | Payer: Self-pay | Source: Ambulatory Visit | Attending: Internal Medicine

## 2013-11-03 ENCOUNTER — Encounter (HOSPITAL_COMMUNITY): Payer: Self-pay | Admitting: *Deleted

## 2013-11-03 ENCOUNTER — Ambulatory Visit (HOSPITAL_COMMUNITY)
Admission: RE | Admit: 2013-11-03 | Discharge: 2013-11-03 | Disposition: A | Discharge status: Home / Self Care | Payer: Medicare Other | Source: Ambulatory Visit | Attending: Internal Medicine | Admitting: Internal Medicine

## 2013-11-03 DIAGNOSIS — Z1211 Encounter for screening for malignant neoplasm of colon: Secondary | ICD-10-CM | POA: Diagnosis not present

## 2013-11-03 DIAGNOSIS — Z7982 Long term (current) use of aspirin: Secondary | ICD-10-CM | POA: Insufficient documentation

## 2013-11-03 DIAGNOSIS — Z882 Allergy status to sulfonamides status: Secondary | ICD-10-CM | POA: Insufficient documentation

## 2013-11-03 DIAGNOSIS — Z79899 Other long term (current) drug therapy: Secondary | ICD-10-CM | POA: Insufficient documentation

## 2013-11-03 HISTORY — PX: COLONOSCOPY: SHX5424

## 2013-11-03 SURGERY — COLONOSCOPY
Anesthesia: Moderate Sedation

## 2013-11-03 MED ORDER — MEPERIDINE HCL 100 MG/ML IJ SOLN
INTRAMUSCULAR | Status: AC
  Filled 2013-11-03: qty 2

## 2013-11-03 MED ORDER — STERILE WATER FOR IRRIGATION IR SOLN
Status: DC | PRN
  Administered 2013-11-03: 11:00:00

## 2013-11-03 MED ORDER — SODIUM CHLORIDE 0.9 % IV SOLN
INTRAVENOUS | Status: DC
  Administered 2013-11-03: 10:00:00 via INTRAVENOUS

## 2013-11-03 MED ORDER — MEPERIDINE HCL 100 MG/ML IJ SOLN
INTRAMUSCULAR | Status: DC | PRN
Start: 2013-11-03 — End: 2013-11-03
  Administered 2013-11-03: 50 mg via INTRAVENOUS

## 2013-11-03 MED ORDER — MIDAZOLAM HCL 5 MG/5ML IJ SOLN
INTRAMUSCULAR | Status: AC
  Filled 2013-11-03: qty 10

## 2013-11-03 MED ORDER — MIDAZOLAM HCL 5 MG/5ML IJ SOLN
INTRAMUSCULAR | Status: DC | PRN
  Administered 2013-11-03: 1 mg via INTRAVENOUS
  Administered 2013-11-03: 2 mg via INTRAVENOUS

## 2013-11-03 MED ORDER — ONDANSETRON HCL 4 MG/2ML IJ SOLN
INTRAMUSCULAR | Status: AC
  Administered 2013-11-03: 4 mg
  Filled 2013-11-03: qty 2

## 2013-11-03 NOTE — H&P (View-Only) (Signed)
Primary Care Physician:  Asencion Noble, MD  Primary Gastroenterologist:  Garfield Cornea, MD   Chief Complaint  Patient presents with  . Colonoscopy    ov to determin if tcs is needed- pt doing well    HPI:  [name] is a 73 y.o. female here to discuss possibility of colonoscopy. Last colonoscopy September 2005. No family history of colon polyps or colon cancer. She had normal rectum and colon. There was subtle abnormalities in the ileocecal valve and terminal ileum. Biopsies were obtained but they are not available to me at this time. Clinically she is doing well. Denies any bowel issues. No blood in the stool or melena. No heartburn, vomiting, dysphagia, unintentional weight loss.   Current Outpatient Prescriptions  Medication Sig Dispense Refill  . amitriptyline (ELAVIL) 100 MG tablet Take 100 mg by mouth at bedtime.      Marland Kitchen aspirin EC 81 MG tablet Take 81 mg by mouth daily.      . Calcium Carbonate-Vitamin D (CALCIUM 600 + D PO) Take 1 tablet by mouth daily.       . diclofenac (CATAFLAM) 50 MG tablet Take 1 tablet (50 mg total) by mouth 2 (two) times daily.  60 tablet  6  . gabapentin (NEURONTIN) 300 MG capsule Take 300 mg by mouth 2 (two) times daily.      . Multiple Vitamin (MULTIVITAMIN) capsule Take 1 capsule by mouth daily.      . simvastatin (ZOCOR) 10 MG tablet Take 10 mg by mouth at bedtime.      . peg 3350 powder (MOVIPREP) 100 G SOLR Take 1 kit (200 g total) by mouth as directed.  1 kit  0   No current facility-administered medications for this visit.    Allergies as of 10/08/2013 - Review Complete 10/08/2013  Allergen Reaction Noted  . Sulfa antibiotics  05/11/2011    Past Medical History  Diagnosis Date  . Hyperlipemia   . B12 deficiency     managed by Dr. Willey Blade: monthly injections    Past Surgical History  Procedure Laterality Date  . Appendectomy    . Colonoscopy  09/30/2003    RMR: Normal colon. Subtle abnormalities in the ileocecal valve and terminal   ileum as described above of doubtful clinical significance/Normal rectum  . Ankle fracture surgery      left  . Right leg fracture surgery      right    Family History  Problem Relation Age of Onset  . Colon cancer Neg Hx   . Colon polyps Neg Hx   . Breast cancer Neg Hx   . Lung cancer Neg Hx   . Heart disease Daughter     transposition of great arteries/on fourth pacemaker    History   Social History  . Marital Status: Widowed    Spouse Name: N/A    Number of Children: 1  . Years of Education: N/A   Occupational History  . Not on file.   Social History Main Topics  . Smoking status: Never Smoker   . Smokeless tobacco: Not on file  . Alcohol Use: No  . Drug Use: No  . Sexual Activity: Not on file   Other Topics Concern  . Not on file   Social History Narrative  . No narrative on file      ROS:  General: Negative for anorexia, weight loss, fever, chills, fatigue, weakness. Eyes: Negative for vision changes.  ENT: Negative for hoarseness, difficulty swallowing , nasal congestion.  CV: Negative for chest pain, angina, palpitations, dyspnea on exertion, peripheral edema.  Respiratory: Negative for dyspnea at rest, dyspnea on exertion, cough, sputum, wheezing.  GI: See history of present illness. GU:  Negative for dysuria, hematuria, urinary incontinence, urinary frequency, nocturnal urination.  MS: Negative for joint pain, low back pain.  Derm: Negative for rash or itching.  Neuro: Negative for weakness, abnormal sensation, seizure, frequent headaches, memory loss, confusion.  Psych: Negative for anxiety, depression, suicidal ideation, hallucinations.  Endo: Negative for unusual weight change.  Heme: Negative for bruising or bleeding. Allergy: Negative for rash or hives.    Physical Examination:  BP 130/69  Pulse 76  Temp(Src) 96.9 F (36.1 C) (Oral)  Ht _0  (1.575 m)  Wt 159 lb 12.8 oz (72.485 kg)  BMI 29.22 kg/m2   General: Well-nourished,  well-developed in no acute distress.  Head: Normocephalic, atraumatic.   Eyes: Conjunctiva pink, no icterus. Mouth: Oropharyngeal mucosa moist and pink , no lesions erythema or exudate. Neck: Supple without thyromegaly, masses, or lymphadenopathy.  Lungs: Clear to auscultation bilaterally.  Heart: Regular rate and rhythm, no murmurs rubs or gallops.  Abdomen: Bowel sounds are normal, nontender, nondistended, no hepatosplenomegaly or masses, no abdominal bruits or    hernia , no rebound or guarding.   Rectal: Deferred Extremities: No lower extremity edema. No clubbing or deformities.  Neuro: Alert and oriented x 4 , grossly normal neurologically.  Skin: Warm and dry, no rash or jaundice.   Psych: Alert and cooperative, normal mood and affect.  Labs: Lab Results  Component Value Date   WBC 6.2 04/19/2013   HGB 14.0 04/19/2013   HCT 39.4 04/19/2013   MCV 91.8 04/19/2013   PLT 184 04/19/2013   Lab Results  Component Value Date   ALT 42* 04/19/2013   AST 36 04/19/2013   ALKPHOS 82 04/19/2013   BILITOT 0.8 04/19/2013   Lab Results  Component Value Date   CREATININE 0.78 04/19/2013   BUN 21 04/19/2013   NA 140 04/19/2013   K 4.6 04/19/2013   CL 102 04/19/2013   CO2 28 04/19/2013     Imaging Studies: No results found.

## 2013-11-03 NOTE — Interval H&P Note (Signed)
History and Physical Interval Note:  11/03/2013 10:39 AM  Morgan BleakMary P Bartolomei  has presented today for surgery, with the diagnosis of Screening TCS  The various methods of treatment have been discussed with the patient and family. After consideration of risks, benefits and other options for treatment, the patient has consented to  Procedure(s) with comments: COLONOSCOPY (N/A) - 830 - moved to 10:45 - Ginger to notify pt as a surgical intervention .  The patient's history has been reviewed, patient examined, no change in status, stable for surgery.  I have reviewed the patient's chart and labs.  Questions were answered to the patient's satisfaction.     Dove Gresham      No change. Colonoscopy per plan.The risks, benefits, limitations, alternatives and imponderables have been reviewed with the patient. Questions have been answered. All parties are agreeable.

## 2013-11-03 NOTE — Op Note (Signed)
[facility] [address] [place], [id number]   COLONOSCOPY PROCEDURE REPORT  PATIENT: [name], Ena P  MR#: [id number] BIRTHDATE: 02/10/1940 ,[facility] 72  yrs. old GENDER: female ENDOSCOPIST: R.  Roetta SessionsMichael Rourk, MD FACP [facility] REFERRED BY:Roy Ouida SillsFagan, M.D. PROCEDURE DATE:  11/03/2013 PROCEDURE:   Colonoscopy, screening INDICATIONS:average risk for colorectal cancer. MEDICATIONS: Versed 3 mg and Demerol 50 mg IV in divided doses. Zofran 4 mg IV ASA CLASS:       Class II  CONSENT: The risks, benefits, alternatives and imponderables including but not limited to bleeding, perforation as well as the possibility of a missed lesion have been reviewed.  The potential for biopsy, lesion removal, etc. have also been discussed. Questions have been answered.  All parties agreeable.  Please see the history and physical in the medical record for more information.  DESCRIPTION OF PROCEDURE:   After the risks benefits and alternatives of the procedure were thoroughly explained, informed consent was obtained.  The digital rectal exam revealed no abnormalities of the rectum.   The EC-[id number] ([id number]  endoscope was introduced through the anus and advanced to the cecum, which was identified by both the appendix and ileocecal valve. No adverse events experienced.   The quality of the prep was adequate.  The instrument was then slowly withdrawn as the colon was fully examined.      COLON FINDINGS: Normal-appearing rectal mucosa.  The colonic mucosa appeared normal.  Retroflexed views revealed no abnormalities. .  Withdrawal time=8 minutes 0 seconds.  The scope was withdrawn and the procedure completed. COMPLICATIONS: There were no immediate complications.  ENDOSCOPIC IMPRESSION: Normal colonoscopy  RECOMMENDATIONS: I do not recommend a future colonoscopy unless new symptoms develop.  eSigned:  R. Roetta SessionsMichael Rourk, MD Jerrel IvoryFACP [facility] 11/03/2013 11:14 AM   cc:  CPT CODES: ICD  CODES:  The ICD and CPT codes recommended by this software are interpretations from the data that the clinical staff has captured with the software.  The verification of the translation of this report to the ICD and CPT codes and modifiers is the sole responsibility of the health care institution and practicing physician where this report was generated.  [employer] will not be held responsible for the validity of the ICD and CPT codes included on this report.  AMA assumes no liability for data contained or not contained herein. CPT is a Publishing rights managerregistered trademark of the [employer].

## 2013-11-03 NOTE — Discharge Instructions (Signed)
°  Colonoscopy Discharge Instructions  Read the instructions outlined below and refer to this sheet in the next few weeks. These discharge instructions provide you with general information on caring for yourself after you leave the hospital. Your doctor may also give you specific instructions. While your treatment has been planned according to the most current medical practices available, unavoidable complications occasionally occur. If you have any problems or questions after discharge, call Dr. Jena Gaussourk at [phone]. ACTIVITY  You may resume your regular activity, but move at a slower pace for the next 24 hours.   Take frequent rest periods for the next 24 hours.   Walking will help get rid of the air and reduce the bloated feeling in your belly (abdomen).   No driving for 24 hours (because of the medicine (anesthesia) used during the test).    Do not sign any important legal documents or operate any machinery for 24 hours (because of the anesthesia used during the test).  NUTRITION  Drink plenty of fluids.   You may resume your normal diet as instructed by your doctor.   Begin with a light meal and progress to your normal diet. Heavy or fried foods are harder to digest and may make you feel sick to your stomach (nauseated).   Avoid alcoholic beverages for 24 hours or as instructed.  MEDICATIONS  You may resume your normal medications unless your doctor tells you otherwise.  WHAT YOU CAN EXPECT TODAY  Some feelings of bloating in the abdomen.   Passage of more gas than usual.   Spotting of blood in your stool or on the toilet paper.  IF YOU HAD POLYPS REMOVED DURING THE COLONOSCOPY:  No aspirin products for 7 days or as instructed.   No alcohol for 7 days or as instructed.   Eat a soft diet for the next 24 hours.  FINDING OUT THE RESULTS OF YOUR TEST Not all test results are available during your visit. If your test results are not back during the visit, make an appointment  with your caregiver to find out the results. Do not assume everything is normal if you have not heard from your caregiver or the medical facility. It is important for you to follow up on all of your test results.  SEEK IMMEDIATE MEDICAL ATTENTION IF:  You have more than a spotting of blood in your stool.   Your belly is swollen (abdominal distention).   You are nauseated or vomiting.   You have a temperature over 101.   You have abdominal pain or discomfort that is severe or gets worse throughout the day.    Your colonoscopy today was normal.  I do not recommend a future colonoscopy unless you develop new symptoms.

## 2013-11-04 ENCOUNTER — Encounter (HOSPITAL_COMMUNITY): Payer: Self-pay | Admitting: Internal Medicine

## 2014-02-05 DIAGNOSIS — E538 Deficiency of other specified B group vitamins: Secondary | ICD-10-CM | POA: Diagnosis not present

## 2014-02-08 ENCOUNTER — Other Ambulatory Visit: Payer: Self-pay | Admitting: Orthopedic Surgery

## 2014-02-11 ENCOUNTER — Other Ambulatory Visit: Payer: Self-pay | Admitting: *Deleted

## 2014-02-11 MED ORDER — DICLOFENAC POTASSIUM 50 MG PO TABS: 50.0000 mg | ORAL_TABLET | Freq: Two times a day (BID) | ORAL | Status: AC

## 2014-02-16 ENCOUNTER — Other Ambulatory Visit (HOSPITAL_COMMUNITY): Payer: Self-pay | Admitting: Internal Medicine

## 2014-02-16 DIAGNOSIS — Z1231 Encounter for screening mammogram for malignant neoplasm of breast: Secondary | ICD-10-CM

## 2014-03-16 ENCOUNTER — Ambulatory Visit (HOSPITAL_COMMUNITY)
Admission: RE | Admit: 2014-03-16 | Discharge: 2014-03-16 | Disposition: A | Discharge status: Home / Self Care | Payer: Medicare Other | Source: Ambulatory Visit | Attending: Internal Medicine | Admitting: Internal Medicine

## 2014-03-16 DIAGNOSIS — E538 Deficiency of other specified B group vitamins: Secondary | ICD-10-CM | POA: Diagnosis not present

## 2014-03-16 DIAGNOSIS — Z1231 Encounter for screening mammogram for malignant neoplasm of breast: Secondary | ICD-10-CM | POA: Diagnosis not present

## 2014-04-13 DIAGNOSIS — E785 Hyperlipidemia, unspecified: Secondary | ICD-10-CM | POA: Diagnosis not present

## 2014-04-13 DIAGNOSIS — R7301 Impaired fasting glucose: Secondary | ICD-10-CM | POA: Diagnosis not present

## 2014-04-13 DIAGNOSIS — E538 Deficiency of other specified B group vitamins: Secondary | ICD-10-CM | POA: Diagnosis not present

## 2014-04-13 DIAGNOSIS — Z79899 Other long term (current) drug therapy: Secondary | ICD-10-CM | POA: Diagnosis not present

## 2014-04-13 DIAGNOSIS — G459 Transient cerebral ischemic attack, unspecified: Secondary | ICD-10-CM | POA: Diagnosis not present

## 2014-04-21 DIAGNOSIS — Z23 Encounter for immunization: Secondary | ICD-10-CM | POA: Diagnosis not present

## 2014-04-21 DIAGNOSIS — E785 Hyperlipidemia, unspecified: Secondary | ICD-10-CM | POA: Diagnosis not present

## 2014-04-21 DIAGNOSIS — I491 Atrial premature depolarization: Secondary | ICD-10-CM | POA: Diagnosis not present

## 2014-04-21 DIAGNOSIS — Z0001 Encounter for general adult medical examination with abnormal findings: Secondary | ICD-10-CM | POA: Diagnosis not present

## 2014-04-21 DIAGNOSIS — M1990 Unspecified osteoarthritis, unspecified site: Secondary | ICD-10-CM | POA: Diagnosis not present

## 2014-04-28 DIAGNOSIS — E538 Deficiency of other specified B group vitamins: Secondary | ICD-10-CM | POA: Diagnosis not present

## 2014-05-22 ENCOUNTER — Other Ambulatory Visit: Payer: Self-pay | Admitting: Orthopedic Surgery

## 2014-05-29 DIAGNOSIS — E538 Deficiency of other specified B group vitamins: Secondary | ICD-10-CM | POA: Diagnosis not present

## 2014-06-30 DIAGNOSIS — E538 Deficiency of other specified B group vitamins: Secondary | ICD-10-CM | POA: Diagnosis not present

## 2014-07-30 DIAGNOSIS — Z961 Presence of intraocular lens: Secondary | ICD-10-CM | POA: Diagnosis not present

## 2014-07-31 DIAGNOSIS — E538 Deficiency of other specified B group vitamins: Secondary | ICD-10-CM | POA: Diagnosis not present

## 2014-09-01 DIAGNOSIS — E538 Deficiency of other specified B group vitamins: Secondary | ICD-10-CM | POA: Diagnosis not present

## 2014-10-08 DIAGNOSIS — E538 Deficiency of other specified B group vitamins: Secondary | ICD-10-CM | POA: Diagnosis not present

## 2014-10-21 DIAGNOSIS — Z6829 Body mass index (BMI) 29.0-29.9, adult: Secondary | ICD-10-CM | POA: Diagnosis not present

## 2014-10-21 DIAGNOSIS — G9009 Other idiopathic peripheral autonomic neuropathy: Secondary | ICD-10-CM | POA: Diagnosis not present

## 2014-10-21 DIAGNOSIS — M1990 Unspecified osteoarthritis, unspecified site: Secondary | ICD-10-CM | POA: Diagnosis not present

## 2014-11-10 DIAGNOSIS — E538 Deficiency of other specified B group vitamins: Secondary | ICD-10-CM | POA: Diagnosis not present

## 2014-11-20 DIAGNOSIS — Z6829 Body mass index (BMI) 29.0-29.9, adult: Secondary | ICD-10-CM | POA: Diagnosis not present

## 2014-11-20 DIAGNOSIS — Z23 Encounter for immunization: Secondary | ICD-10-CM | POA: Diagnosis not present

## 2014-11-20 DIAGNOSIS — G9009 Other idiopathic peripheral autonomic neuropathy: Secondary | ICD-10-CM | POA: Diagnosis not present

## 2014-12-15 DIAGNOSIS — E538 Deficiency of other specified B group vitamins: Secondary | ICD-10-CM | POA: Diagnosis not present

## 2015-01-15 DIAGNOSIS — E538 Deficiency of other specified B group vitamins: Secondary | ICD-10-CM | POA: Diagnosis not present

## 2015-02-05 DIAGNOSIS — G9009 Other idiopathic peripheral autonomic neuropathy: Secondary | ICD-10-CM | POA: Diagnosis not present

## 2015-02-16 ENCOUNTER — Other Ambulatory Visit (HOSPITAL_COMMUNITY): Payer: Self-pay | Admitting: Internal Medicine

## 2015-02-16 DIAGNOSIS — Z1231 Encounter for screening mammogram for malignant neoplasm of breast: Secondary | ICD-10-CM

## 2015-02-22 DIAGNOSIS — E538 Deficiency of other specified B group vitamins: Secondary | ICD-10-CM | POA: Diagnosis not present

## 2015-03-17 ENCOUNTER — Ambulatory Visit (HOSPITAL_COMMUNITY)
Admission: RE | Admit: 2015-03-17 | Discharge: 2015-03-17 | Disposition: A | Discharge status: Home / Self Care | Payer: Medicare Other | Source: Ambulatory Visit | Attending: Internal Medicine | Admitting: Internal Medicine

## 2015-03-17 DIAGNOSIS — Z1231 Encounter for screening mammogram for malignant neoplasm of breast: Secondary | ICD-10-CM

## 2015-03-24 DIAGNOSIS — E538 Deficiency of other specified B group vitamins: Secondary | ICD-10-CM | POA: Diagnosis not present

## 2015-04-23 DIAGNOSIS — Z79899 Other long term (current) drug therapy: Secondary | ICD-10-CM | POA: Diagnosis not present

## 2015-04-23 DIAGNOSIS — E785 Hyperlipidemia, unspecified: Secondary | ICD-10-CM | POA: Diagnosis not present

## 2015-04-23 DIAGNOSIS — G459 Transient cerebral ischemic attack, unspecified: Secondary | ICD-10-CM | POA: Diagnosis not present

## 2015-04-23 DIAGNOSIS — E538 Deficiency of other specified B group vitamins: Secondary | ICD-10-CM | POA: Diagnosis not present

## 2015-04-23 DIAGNOSIS — M1990 Unspecified osteoarthritis, unspecified site: Secondary | ICD-10-CM | POA: Diagnosis not present

## 2015-04-26 DIAGNOSIS — E538 Deficiency of other specified B group vitamins: Secondary | ICD-10-CM | POA: Diagnosis not present

## 2015-04-30 ENCOUNTER — Other Ambulatory Visit (HOSPITAL_COMMUNITY): Payer: Self-pay | Admitting: Internal Medicine

## 2015-04-30 DIAGNOSIS — E538 Deficiency of other specified B group vitamins: Secondary | ICD-10-CM | POA: Diagnosis not present

## 2015-04-30 DIAGNOSIS — Z780 Asymptomatic menopausal state: Secondary | ICD-10-CM

## 2015-04-30 DIAGNOSIS — E785 Hyperlipidemia, unspecified: Secondary | ICD-10-CM | POA: Diagnosis not present

## 2015-04-30 DIAGNOSIS — Z0001 Encounter for general adult medical examination with abnormal findings: Secondary | ICD-10-CM | POA: Diagnosis not present

## 2015-05-04 ENCOUNTER — Encounter: Payer: Self-pay | Admitting: *Deleted

## 2015-05-06 ENCOUNTER — Other Ambulatory Visit (HOSPITAL_COMMUNITY): Payer: Medicare Other

## 2015-05-11 ENCOUNTER — Ambulatory Visit (INDEPENDENT_AMBULATORY_CARE_PROVIDER_SITE_OTHER): Payer: Medicare Other | Admitting: Adult Health

## 2015-05-11 ENCOUNTER — Encounter: Payer: Self-pay | Admitting: Adult Health

## 2015-05-11 VITALS — BP 140/84 | HR 56 | Ht 61.5 in | Wt 155.5 lb

## 2015-05-11 DIAGNOSIS — N952 Postmenopausal atrophic vaginitis: Secondary | ICD-10-CM

## 2015-05-11 DIAGNOSIS — R319 Hematuria, unspecified: Secondary | ICD-10-CM | POA: Diagnosis not present

## 2015-05-11 DIAGNOSIS — N9489 Other specified conditions associated with female genital organs and menstrual cycle: Secondary | ICD-10-CM

## 2015-05-11 DIAGNOSIS — Z1212 Encounter for screening for malignant neoplasm of rectum: Secondary | ICD-10-CM

## 2015-05-11 DIAGNOSIS — R102 Pelvic and perineal pain: Secondary | ICD-10-CM

## 2015-05-11 HISTORY — DX: Pelvic and perineal pain: R10.2

## 2015-05-11 HISTORY — DX: Postmenopausal atrophic vaginitis: N95.2

## 2015-05-11 HISTORY — DX: Hematuria, unspecified: R31.9

## 2015-05-11 LAB — POCT URINALYSIS DIPSTICK
GLUCOSE UA: NEGATIVE
NITRITE UA: NEGATIVE
Protein, UA: NEGATIVE

## 2015-05-11 LAB — HEMOCCULT GUIAC POC 1CARD (OFFICE): Fecal Occult Blood, POC: NEGATIVE

## 2015-05-11 NOTE — Progress Notes (Signed)
Subjective:     Patient ID: [name], female   DOB: 09/13/1940, 75 y.o.   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 PCP is Dr Ouida SillsFagan.   Review of Systems Patient denies any headaches, hearing loss, fatigue, blurred vision, shortness of breath, chest pain, abdominal pain, problems with bowel movements, or intercourse(not having sex). No joint pain(has neuropathy in hands and feet) or mood swings.See HPI for positives. Reviewed past medical,surgical, social and family history. Reviewed medications and allergies.     Objective:   Physical Exam BP 140/84 mmHg  Pulse 56  Ht 5' 1.5" (1.562 m)  Wt 155 lb 8 oz (70.534 kg)  BMI 28.91 kg/m2 urine dipstick trace leuks and trace blood, Skin warm and dry. Lungs: clear to ausculation bilaterally. Cardiovascular: regular rate and rhythm.Abdomen soft and non tender, no HSM, Pelvic: external genitalia is normal in appearance no lesions, vagina is thin and pale with some strawberry appearance, cervix and uterus are absent, adnexa: no masses or tenderness noted. Bladder is non tender and no masses felt.On rectal exam has good tone, no polyps, has hemorrhoids and hemoccult is negative. 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 Face time 20 minutes with 50 % counseling.    Assessment:     Pelvic pressure  Hematuria Vaginal atrophy     Plan:      UA C&S sent Return in 2 days for gyn US and then see me next week for follow up

## 2015-05-11 NOTE — Patient Instructions (Signed)
Return in 2 days for gyn US See me next week

## 2015-05-12 LAB — URINALYSIS, ROUTINE W REFLEX MICROSCOPIC
Bilirubin, UA: NEGATIVE
GLUCOSE, UA: NEGATIVE
KETONES UA: NEGATIVE
Leukocytes, UA: NEGATIVE
Nitrite, UA: NEGATIVE
Protein, UA: NEGATIVE
RBC, UA: NEGATIVE
SPEC GRAV UA: 1.012 (ref 1.005–1.030)
Urobilinogen, Ur: 0.2 mg/dL (ref 0.2–1.0)
pH, UA: 6 (ref 5.0–7.5)

## 2015-05-13 ENCOUNTER — Ambulatory Visit (HOSPITAL_COMMUNITY)
Admission: RE | Admit: 2015-05-13 | Discharge: 2015-05-13 | Disposition: A | Discharge status: Home / Self Care | Payer: Medicare Other | Source: Ambulatory Visit | Attending: Internal Medicine | Admitting: Internal Medicine

## 2015-05-13 ENCOUNTER — Telehealth: Payer: Self-pay | Admitting: Adult Health

## 2015-05-13 ENCOUNTER — Ambulatory Visit (INDEPENDENT_AMBULATORY_CARE_PROVIDER_SITE_OTHER): Payer: Medicare Other

## 2015-05-13 DIAGNOSIS — R29890 Loss of height: Secondary | ICD-10-CM | POA: Insufficient documentation

## 2015-05-13 DIAGNOSIS — M8580 Other specified disorders of bone density and structure, unspecified site: Secondary | ICD-10-CM | POA: Insufficient documentation

## 2015-05-13 DIAGNOSIS — M85852 Other specified disorders of bone density and structure, left thigh: Secondary | ICD-10-CM | POA: Diagnosis not present

## 2015-05-13 DIAGNOSIS — N9489 Other specified conditions associated with female genital organs and menstrual cycle: Secondary | ICD-10-CM | POA: Diagnosis not present

## 2015-05-13 DIAGNOSIS — M85831 Other specified disorders of bone density and structure, right forearm: Secondary | ICD-10-CM | POA: Insufficient documentation

## 2015-05-13 DIAGNOSIS — Z780 Asymptomatic menopausal state: Secondary | ICD-10-CM | POA: Diagnosis not present

## 2015-05-13 DIAGNOSIS — R102 Pelvic and perineal pain: Secondary | ICD-10-CM

## 2015-05-13 LAB — URINE CULTURE

## 2015-05-13 NOTE — Telephone Encounter (Signed)
Left message to call in am  

## 2015-05-13 NOTE — Progress Notes (Signed)
PELVIC US TA/TV: normal vag cuff, rt ovary contains mult.calcification and two simple cysts (#1) 1.8 x 1.2 x 1.7cm (#2) 1.4 x 1.5 x 1 cm, two left ovarian cysts (#1) 2.3 x 1.6 x 2cm,(#2) 1.9 x 1.8 x 1.4 cm,ov's appear to be mobile,left adnexal pain during ultrasound,no free fluid seen

## 2015-05-14 ENCOUNTER — Telehealth: Payer: Self-pay | Admitting: Adult Health

## 2015-05-14 NOTE — Telephone Encounter (Signed)
Pt aware pelvic US was normal with benign cysts both ovaries, ws reviewed by Dr Emelda FearFerguson

## 2015-05-18 ENCOUNTER — Ambulatory Visit: Payer: Medicare Other | Admitting: Adult Health

## 2015-06-14 DIAGNOSIS — E538 Deficiency of other specified B group vitamins: Secondary | ICD-10-CM | POA: Diagnosis not present

## 2015-07-20 DIAGNOSIS — E538 Deficiency of other specified B group vitamins: Secondary | ICD-10-CM | POA: Diagnosis not present

## 2015-08-19 DIAGNOSIS — E538 Deficiency of other specified B group vitamins: Secondary | ICD-10-CM | POA: Diagnosis not present

## 2015-09-21 DIAGNOSIS — E538 Deficiency of other specified B group vitamins: Secondary | ICD-10-CM | POA: Diagnosis not present

## 2015-10-20 DIAGNOSIS — M79675 Pain in left toe(s): Secondary | ICD-10-CM | POA: Diagnosis not present

## 2015-10-20 DIAGNOSIS — L600 Ingrowing nail: Secondary | ICD-10-CM | POA: Diagnosis not present

## 2015-10-20 DIAGNOSIS — L03032 Cellulitis of left toe: Secondary | ICD-10-CM | POA: Diagnosis not present

## 2015-10-20 DIAGNOSIS — M79672 Pain in left foot: Secondary | ICD-10-CM | POA: Diagnosis not present

## 2015-10-21 DIAGNOSIS — E538 Deficiency of other specified B group vitamins: Secondary | ICD-10-CM | POA: Diagnosis not present

## 2015-11-04 DIAGNOSIS — L03031 Cellulitis of right toe: Secondary | ICD-10-CM | POA: Diagnosis not present

## 2015-11-04 DIAGNOSIS — L600 Ingrowing nail: Secondary | ICD-10-CM | POA: Diagnosis not present

## 2015-11-04 DIAGNOSIS — M79674 Pain in right toe(s): Secondary | ICD-10-CM | POA: Diagnosis not present

## 2015-11-04 DIAGNOSIS — M79671 Pain in right foot: Secondary | ICD-10-CM | POA: Diagnosis not present

## 2015-11-11 DIAGNOSIS — Z23 Encounter for immunization: Secondary | ICD-10-CM | POA: Diagnosis not present

## 2015-11-18 DIAGNOSIS — L03031 Cellulitis of right toe: Secondary | ICD-10-CM | POA: Diagnosis not present

## 2015-11-18 DIAGNOSIS — M79674 Pain in right toe(s): Secondary | ICD-10-CM | POA: Diagnosis not present

## 2015-11-30 DIAGNOSIS — E538 Deficiency of other specified B group vitamins: Secondary | ICD-10-CM | POA: Diagnosis not present

## 2015-12-30 DIAGNOSIS — E538 Deficiency of other specified B group vitamins: Secondary | ICD-10-CM | POA: Diagnosis not present

## 2016-02-15 DIAGNOSIS — E538 Deficiency of other specified B group vitamins: Secondary | ICD-10-CM | POA: Diagnosis not present

## 2016-03-02 ENCOUNTER — Other Ambulatory Visit (HOSPITAL_COMMUNITY): Payer: Self-pay | Admitting: Internal Medicine

## 2016-03-02 DIAGNOSIS — Z1231 Encounter for screening mammogram for malignant neoplasm of breast: Secondary | ICD-10-CM

## 2016-03-17 ENCOUNTER — Ambulatory Visit (HOSPITAL_COMMUNITY): Payer: Medicare Other

## 2016-03-21 DIAGNOSIS — E538 Deficiency of other specified B group vitamins: Secondary | ICD-10-CM | POA: Diagnosis not present

## 2016-03-22 ENCOUNTER — Ambulatory Visit (HOSPITAL_COMMUNITY)
Admission: RE | Admit: 2016-03-22 | Discharge: 2016-03-22 | Disposition: A | Discharge status: Home / Self Care | Payer: Medicare Other | Source: Ambulatory Visit | Attending: Internal Medicine | Admitting: Internal Medicine

## 2016-03-22 DIAGNOSIS — Z1231 Encounter for screening mammogram for malignant neoplasm of breast: Secondary | ICD-10-CM | POA: Insufficient documentation

## 2016-04-18 DIAGNOSIS — E538 Deficiency of other specified B group vitamins: Secondary | ICD-10-CM | POA: Diagnosis not present

## 2016-05-08 DIAGNOSIS — R7301 Impaired fasting glucose: Secondary | ICD-10-CM | POA: Diagnosis not present

## 2016-05-08 DIAGNOSIS — Z79899 Other long term (current) drug therapy: Secondary | ICD-10-CM | POA: Diagnosis not present

## 2016-05-08 DIAGNOSIS — E538 Deficiency of other specified B group vitamins: Secondary | ICD-10-CM | POA: Diagnosis not present

## 2016-05-08 DIAGNOSIS — E785 Hyperlipidemia, unspecified: Secondary | ICD-10-CM | POA: Diagnosis not present

## 2016-05-08 DIAGNOSIS — M1990 Unspecified osteoarthritis, unspecified site: Secondary | ICD-10-CM | POA: Diagnosis not present

## 2016-05-16 DIAGNOSIS — E538 Deficiency of other specified B group vitamins: Secondary | ICD-10-CM | POA: Diagnosis not present

## 2016-05-16 DIAGNOSIS — M1990 Unspecified osteoarthritis, unspecified site: Secondary | ICD-10-CM | POA: Diagnosis not present

## 2016-05-16 DIAGNOSIS — Z0001 Encounter for general adult medical examination with abnormal findings: Secondary | ICD-10-CM | POA: Diagnosis not present

## 2016-05-16 DIAGNOSIS — E785 Hyperlipidemia, unspecified: Secondary | ICD-10-CM | POA: Diagnosis not present

## 2016-07-04 ENCOUNTER — Encounter: Payer: Self-pay | Admitting: Orthopedic Surgery

## 2016-07-04 ENCOUNTER — Ambulatory Visit (INDEPENDENT_AMBULATORY_CARE_PROVIDER_SITE_OTHER): Payer: Medicare Other | Admitting: Orthopedic Surgery

## 2016-07-04 ENCOUNTER — Ambulatory Visit (INDEPENDENT_AMBULATORY_CARE_PROVIDER_SITE_OTHER): Payer: Medicare Other

## 2016-07-04 VITALS — BP 145/80 | HR 67 | Wt 161.0 lb

## 2016-07-04 DIAGNOSIS — M25572 Pain in left ankle and joints of left foot: Secondary | ICD-10-CM

## 2016-07-04 DIAGNOSIS — L03116 Cellulitis of left lower limb: Secondary | ICD-10-CM | POA: Diagnosis not present

## 2016-07-04 DIAGNOSIS — M19079 Primary osteoarthritis, unspecified ankle and foot: Secondary | ICD-10-CM

## 2016-07-04 MED ORDER — PREDNISONE 10 MG PO TABS: 10.0000 mg | ORAL_TABLET | Freq: Every day | ORAL | 0 refills | Status: DC

## 2016-07-04 MED ORDER — CEPHALEXIN 500 MG PO CAPS: 500.0000 mg | ORAL_CAPSULE | Freq: Two times a day (BID) | ORAL | 0 refills | Status: DC

## 2016-07-04 NOTE — Patient Instructions (Signed)
Stop diclofenac for 2 weeks and take the prednisone 10 mg daily  Wear ankle brace for 2 weeks  Use ice as needed  Follow-up

## 2016-07-04 NOTE — Progress Notes (Signed)
Patient ID: [name], female   DOB: 05/08/1940, 76 y.o.   MRN: [id number]  Chief Complaint  Patient presents with  . Ankle Pain    LEFT ANKLE PAIN X 6 DAYS, NO KNOWN INURY    75 YO FEMALE H/O ORIF ANKLE (LEFT) C/O 2 WEEKS DULL ACHING PAIN LEFT ANKLE, WITH  SWELLING REL BY REST     Review of Systems  Constitutional: Negative for chills and fever.  Skin:       SKIN AROUND ANKLE IS RED      Past Medical History:  Diagnosis Date  . B12 deficiency    managed by Dr. Ouida SillsFagan: monthly injections  . Hematuria 05/11/2015  . Hemorrhoids   . Hyperlipemia   . Impaired fasting glucose   . Neuropathy (HCC)    hereditary and idiopathic  . Osteoarthritis   . Pelvic pressure in female 05/11/2015  . Transient cerebral ischemic attack   . Vaginal atrophy 05/11/2015    There were no vitals taken for this visit. Gen. appearance is normal grooming and hygiene normal Orientation to person place and time normal Mood normal   Right Ankle Exam  Right ankle exam is normal. Other  Erythema: absent Sensation: normal Pulse: present    Left Ankle Exam  Swelling: mild  Tenderness  The patient is experiencing tenderness in the medial malleolus and lateral malleolus.   Range of Motion  Dorsiflexion: normal  Plantar flexion: normal  Inversion: normal  Eversion: normal   Muscle Strength  Dorsiflexion:  5/5  Plantar flexion:  5/5  Anterior tibial:  5/5  Posterior tibial:  5/5 Gastrocsoleus:  5/5 Peroneal muscle:  5/5  Tests  Anterior drawer: negative  Other  Erythema: present Scars: present Sensation: normal Pulse: present         A/P  Medical decision-making  Encounter Diagnoses  Name Primary?  . Left ankle pain, unspecified chronicity Yes  . Ankle arthritis   . Cellulitis of left lower extremity       Meds ordered this encounter  Medications  . predniSONE (DELTASONE) 10 MG tablet    Sig: Take 1 tablet (10 mg total) by mouth daily.    Dispense:  60  tablet    Refill:  0    AIR CAST  PREDNISONE AND  KEFLEX  RE CK 4 WEEKS   Fuller CanadaStanley Harrison, MD 07/04/2016 2:33 PM

## 2016-07-12 DIAGNOSIS — M79672 Pain in left foot: Secondary | ICD-10-CM | POA: Diagnosis not present

## 2016-07-12 DIAGNOSIS — B351 Tinea unguium: Secondary | ICD-10-CM | POA: Diagnosis not present

## 2016-07-12 DIAGNOSIS — M79675 Pain in left toe(s): Secondary | ICD-10-CM | POA: Diagnosis not present

## 2016-08-07 ENCOUNTER — Ambulatory Visit: Payer: Medicare Other | Admitting: Orthopedic Surgery

## 2016-08-15 ENCOUNTER — Ambulatory Visit (INDEPENDENT_AMBULATORY_CARE_PROVIDER_SITE_OTHER): Payer: Medicare Other | Admitting: Orthopedic Surgery

## 2016-08-15 ENCOUNTER — Encounter: Payer: Self-pay | Admitting: Orthopedic Surgery

## 2016-08-15 DIAGNOSIS — L03116 Cellulitis of left lower limb: Secondary | ICD-10-CM

## 2016-08-15 DIAGNOSIS — M19079 Primary osteoarthritis, unspecified ankle and foot: Secondary | ICD-10-CM

## 2016-08-15 DIAGNOSIS — E538 Deficiency of other specified B group vitamins: Secondary | ICD-10-CM | POA: Diagnosis not present

## 2016-08-15 DIAGNOSIS — M25572 Pain in left ankle and joints of left foot: Secondary | ICD-10-CM | POA: Diagnosis not present

## 2016-08-15 NOTE — Progress Notes (Signed)
FOLLOW UP VISIT    Chief Complaint  Patient presents with  . Follow-up    LEFT ANKLE PAIN   S/P ORIF LEFT ANKLE > 10 YRS AGO, STARTED HAVING PAIN AND SWELLING. I GAVE HER KEFLEX AND SHE IMPROVED   ROS THE RIGHT LEG PAIN IMPROVED WITH ORAL STEROIDS   Left Ankle Exam   Tenderness  None  Range of Motion  Normal left ankle ROM  Muscle Strength  Normal left ankle strength  Tests  Anterior drawer: Negative.  Comments:  SKIN NORMAL  NORMAL PULSE  ERYTHEMA RESOLVED       Encounter Diagnoses  Name Primary?  . Left ankle pain, unspecified chronicity Yes  . Ankle arthritis   . Cellulitis of left lower extremity     RETURN AS NEEDED

## 2016-09-21 ENCOUNTER — Other Ambulatory Visit: Payer: Self-pay | Admitting: *Deleted

## 2016-09-21 ENCOUNTER — Telehealth: Payer: Self-pay | Admitting: Orthopedic Surgery

## 2016-09-21 DIAGNOSIS — M19079 Primary osteoarthritis, unspecified ankle and foot: Secondary | ICD-10-CM

## 2016-09-21 DIAGNOSIS — M25572 Pain in left ankle and joints of left foot: Secondary | ICD-10-CM

## 2016-09-21 DIAGNOSIS — L03116 Cellulitis of left lower limb: Secondary | ICD-10-CM

## 2016-09-21 MED ORDER — PREDNISONE 10 MG PO TABS: 10.0000 mg | ORAL_TABLET | Freq: Every day | ORAL | 0 refills | Status: DC

## 2016-09-21 MED ORDER — CEPHALEXIN 500 MG PO CAPS: 500.0000 mg | ORAL_CAPSULE | Freq: Two times a day (BID) | ORAL | 0 refills | Status: DC

## 2016-09-21 NOTE — Telephone Encounter (Signed)
Patient called and said she was still having some leg soreness and wants refills on Keflex and Prednisone 10 mgs.

## 2016-09-21 NOTE — Telephone Encounter (Signed)
ROUTING TO DR HARRISON 

## 2016-09-21 NOTE — Telephone Encounter (Signed)
APPROVED ON BOTH

## 2016-09-21 NOTE — Telephone Encounter (Signed)
Left message for patient to check with pharmacy.

## 2016-09-22 DIAGNOSIS — E538 Deficiency of other specified B group vitamins: Secondary | ICD-10-CM | POA: Diagnosis not present

## 2016-09-22 DIAGNOSIS — R05 Cough: Secondary | ICD-10-CM | POA: Diagnosis not present

## 2016-11-21 DIAGNOSIS — Z23 Encounter for immunization: Secondary | ICD-10-CM | POA: Diagnosis not present

## 2016-11-21 DIAGNOSIS — E538 Deficiency of other specified B group vitamins: Secondary | ICD-10-CM | POA: Diagnosis not present

## 2016-12-27 DIAGNOSIS — E538 Deficiency of other specified B group vitamins: Secondary | ICD-10-CM | POA: Diagnosis not present

## 2017-01-26 DIAGNOSIS — E538 Deficiency of other specified B group vitamins: Secondary | ICD-10-CM | POA: Diagnosis not present

## 2017-02-06 DIAGNOSIS — Z961 Presence of intraocular lens: Secondary | ICD-10-CM | POA: Diagnosis not present

## 2017-02-15 ENCOUNTER — Other Ambulatory Visit (HOSPITAL_COMMUNITY): Payer: Self-pay | Admitting: Internal Medicine

## 2017-02-15 DIAGNOSIS — Z1231 Encounter for screening mammogram for malignant neoplasm of breast: Secondary | ICD-10-CM

## 2017-02-27 DIAGNOSIS — Z23 Encounter for immunization: Secondary | ICD-10-CM | POA: Diagnosis not present

## 2017-02-27 DIAGNOSIS — Z6831 Body mass index (BMI) 31.0-31.9, adult: Secondary | ICD-10-CM | POA: Diagnosis not present

## 2017-02-27 DIAGNOSIS — J387 Other diseases of larynx: Secondary | ICD-10-CM | POA: Diagnosis not present

## 2017-02-27 DIAGNOSIS — R05 Cough: Secondary | ICD-10-CM | POA: Diagnosis not present

## 2017-02-28 ENCOUNTER — Ambulatory Visit (HOSPITAL_COMMUNITY)
Admission: RE | Admit: 2017-02-28 | Discharge: 2017-02-28 | Disposition: A | Discharge status: Home / Self Care | Payer: Medicare HMO | Source: Ambulatory Visit | Attending: Internal Medicine | Admitting: Internal Medicine

## 2017-02-28 ENCOUNTER — Other Ambulatory Visit (HOSPITAL_COMMUNITY): Payer: Self-pay | Admitting: Internal Medicine

## 2017-02-28 DIAGNOSIS — R05 Cough: Secondary | ICD-10-CM

## 2017-02-28 DIAGNOSIS — R059 Cough, unspecified: Secondary | ICD-10-CM

## 2017-03-20 DIAGNOSIS — K219 Gastro-esophageal reflux disease without esophagitis: Secondary | ICD-10-CM | POA: Diagnosis not present

## 2017-03-20 DIAGNOSIS — Z6831 Body mass index (BMI) 31.0-31.9, adult: Secondary | ICD-10-CM | POA: Diagnosis not present

## 2017-03-26 ENCOUNTER — Ambulatory Visit (HOSPITAL_COMMUNITY): Payer: Medicare Other

## 2017-03-28 ENCOUNTER — Encounter (HOSPITAL_COMMUNITY): Payer: Self-pay

## 2017-03-28 ENCOUNTER — Ambulatory Visit (HOSPITAL_COMMUNITY)
Admission: RE | Admit: 2017-03-28 | Discharge: 2017-03-28 | Disposition: A | Discharge status: Home / Self Care | Payer: Medicare HMO | Source: Ambulatory Visit | Attending: Internal Medicine | Admitting: Internal Medicine

## 2017-03-28 DIAGNOSIS — Z1231 Encounter for screening mammogram for malignant neoplasm of breast: Secondary | ICD-10-CM | POA: Insufficient documentation

## 2017-03-30 DIAGNOSIS — E538 Deficiency of other specified B group vitamins: Secondary | ICD-10-CM | POA: Diagnosis not present

## 2017-05-08 DIAGNOSIS — K219 Gastro-esophageal reflux disease without esophagitis: Secondary | ICD-10-CM | POA: Diagnosis not present

## 2017-05-08 DIAGNOSIS — E539 Vitamin B deficiency, unspecified: Secondary | ICD-10-CM | POA: Diagnosis not present

## 2017-05-08 DIAGNOSIS — Z6830 Body mass index (BMI) 30.0-30.9, adult: Secondary | ICD-10-CM | POA: Diagnosis not present

## 2017-05-31 DIAGNOSIS — Z79899 Other long term (current) drug therapy: Secondary | ICD-10-CM | POA: Diagnosis not present

## 2017-05-31 DIAGNOSIS — G459 Transient cerebral ischemic attack, unspecified: Secondary | ICD-10-CM | POA: Diagnosis not present

## 2017-05-31 DIAGNOSIS — M1990 Unspecified osteoarthritis, unspecified site: Secondary | ICD-10-CM | POA: Diagnosis not present

## 2017-05-31 DIAGNOSIS — R7301 Impaired fasting glucose: Secondary | ICD-10-CM | POA: Diagnosis not present

## 2017-05-31 DIAGNOSIS — E538 Deficiency of other specified B group vitamins: Secondary | ICD-10-CM | POA: Diagnosis not present

## 2017-05-31 DIAGNOSIS — E785 Hyperlipidemia, unspecified: Secondary | ICD-10-CM | POA: Diagnosis not present

## 2017-06-07 DIAGNOSIS — Z0001 Encounter for general adult medical examination with abnormal findings: Secondary | ICD-10-CM | POA: Diagnosis not present

## 2017-06-07 DIAGNOSIS — Z6829 Body mass index (BMI) 29.0-29.9, adult: Secondary | ICD-10-CM | POA: Diagnosis not present

## 2017-06-07 DIAGNOSIS — E538 Deficiency of other specified B group vitamins: Secondary | ICD-10-CM | POA: Diagnosis not present

## 2017-06-07 DIAGNOSIS — E785 Hyperlipidemia, unspecified: Secondary | ICD-10-CM | POA: Diagnosis not present

## 2017-06-07 DIAGNOSIS — R001 Bradycardia, unspecified: Secondary | ICD-10-CM | POA: Diagnosis not present

## 2017-06-07 DIAGNOSIS — G9009 Other idiopathic peripheral autonomic neuropathy: Secondary | ICD-10-CM | POA: Diagnosis not present

## 2017-06-21 DIAGNOSIS — S61213A Laceration without foreign body of left middle finger without damage to nail, initial encounter: Secondary | ICD-10-CM | POA: Diagnosis not present

## 2017-06-21 DIAGNOSIS — R05 Cough: Secondary | ICD-10-CM | POA: Diagnosis not present

## 2017-07-19 DIAGNOSIS — G9009 Other idiopathic peripheral autonomic neuropathy: Secondary | ICD-10-CM | POA: Diagnosis not present

## 2017-07-30 DIAGNOSIS — E538 Deficiency of other specified B group vitamins: Secondary | ICD-10-CM | POA: Diagnosis not present

## 2017-09-03 DIAGNOSIS — E538 Deficiency of other specified B group vitamins: Secondary | ICD-10-CM | POA: Diagnosis not present

## 2017-09-04 ENCOUNTER — Emergency Department (HOSPITAL_COMMUNITY): Payer: Medicare HMO

## 2017-09-04 ENCOUNTER — Telehealth: Payer: Self-pay | Admitting: Orthopedic Surgery

## 2017-09-04 ENCOUNTER — Encounter (HOSPITAL_COMMUNITY): Payer: Self-pay | Admitting: *Deleted

## 2017-09-04 ENCOUNTER — Other Ambulatory Visit: Payer: Self-pay

## 2017-09-04 ENCOUNTER — Emergency Department (HOSPITAL_COMMUNITY)
Admission: EM | Admit: 2017-09-04 | Discharge: 2017-09-04 | Disposition: A | Discharge status: Home / Self Care | Payer: Medicare HMO | Attending: Emergency Medicine | Admitting: Emergency Medicine

## 2017-09-04 DIAGNOSIS — Y92007 Garden or yard of unspecified non-institutional (private) residence as the place of occurrence of the external cause: Secondary | ICD-10-CM | POA: Diagnosis not present

## 2017-09-04 DIAGNOSIS — S52691A Other fracture of lower end of right ulna, initial encounter for closed fracture: Secondary | ICD-10-CM | POA: Diagnosis not present

## 2017-09-04 DIAGNOSIS — Y939 Activity, unspecified: Secondary | ICD-10-CM | POA: Insufficient documentation

## 2017-09-04 DIAGNOSIS — S59291A Other physeal fracture of lower end of radius, right arm, initial encounter for closed fracture: Secondary | ICD-10-CM | POA: Diagnosis not present

## 2017-09-04 DIAGNOSIS — S52291A Other fracture of shaft of right ulna, initial encounter for closed fracture: Secondary | ICD-10-CM

## 2017-09-04 DIAGNOSIS — S59201A Unspecified physeal fracture of lower end of radius, right arm, initial encounter for closed fracture: Secondary | ICD-10-CM

## 2017-09-04 DIAGNOSIS — E785 Hyperlipidemia, unspecified: Secondary | ICD-10-CM | POA: Diagnosis not present

## 2017-09-04 DIAGNOSIS — S52611A Displaced fracture of right ulna styloid process, initial encounter for closed fracture: Secondary | ICD-10-CM | POA: Diagnosis not present

## 2017-09-04 DIAGNOSIS — S4991XA Unspecified injury of right shoulder and upper arm, initial encounter: Secondary | ICD-10-CM | POA: Diagnosis not present

## 2017-09-04 DIAGNOSIS — S52591A Other fractures of lower end of right radius, initial encounter for closed fracture: Secondary | ICD-10-CM | POA: Diagnosis not present

## 2017-09-04 DIAGNOSIS — Z7982 Long term (current) use of aspirin: Secondary | ICD-10-CM | POA: Diagnosis not present

## 2017-09-04 DIAGNOSIS — W19XXXA Unspecified fall, initial encounter: Secondary | ICD-10-CM | POA: Insufficient documentation

## 2017-09-04 DIAGNOSIS — Y998 Other external cause status: Secondary | ICD-10-CM | POA: Insufficient documentation

## 2017-09-04 DIAGNOSIS — Z79899 Other long term (current) drug therapy: Secondary | ICD-10-CM | POA: Insufficient documentation

## 2017-09-04 DIAGNOSIS — S59911A Unspecified injury of right forearm, initial encounter: Secondary | ICD-10-CM | POA: Diagnosis present

## 2017-09-04 DIAGNOSIS — M25511 Pain in right shoulder: Secondary | ICD-10-CM | POA: Diagnosis not present

## 2017-09-04 MED ORDER — TRAMADOL HCL 50 MG PO TABS: 50.0000 mg | ORAL_TABLET | Freq: Four times a day (QID) | ORAL | 0 refills | Status: DC | PRN

## 2017-09-04 MED ORDER — TRAMADOL HCL 50 MG PO TABS
50.0000 mg | ORAL_TABLET | Freq: Once | ORAL | Status: AC
  Administered 2017-09-04: 50 mg via ORAL
  Filled 2017-09-04: qty 1

## 2017-09-04 NOTE — ED Notes (Signed)
Pt has returned from xray

## 2017-09-04 NOTE — Telephone Encounter (Signed)
Patient's daughter [name] called back late this afternoon stating that [name] went to ED.  After x-rays were done, it is determined that she has a fractured wrist.  She was given the option of seeing [name] tomorrow or seeing [name] on Friday.  [name] opted to wait until Friday.  Appointment given for Friday, 09/07/17 at 10:50

## 2017-09-04 NOTE — ED Provider Notes (Addendum)
[facility] EMERGENCY DEPARTMENT Provider Note   CSN: [id number] Arrival date & time: 09/04/17  4098     History   Chief Complaint Chief Complaint  Patient presents with  . Fall    HPI [name] is a 77 y.o. female.  Patient with a fall shortly prior to arrival out in her yard while working.  Patient with complaint of right shoulder pain and distal forearm and wrist pain.  Patient states she did not strike her head no loss of conscious no neck or back pain no hip pain or leg pain.  Patient did not pass out nor did she faint.  Patient is right-hand dominant.     Past Medical History:  Diagnosis Date  . B12 deficiency    managed by Dr. Ouida Sills: monthly injections  . Hematuria 05/11/2015  . Hemorrhoids   . Hyperlipemia   . Impaired fasting glucose   . Neuropathy    hereditary and idiopathic  . Osteoarthritis   . Pelvic pressure in female 05/11/2015  . Transient cerebral ischemic attack   . Vaginal atrophy 05/11/2015    Patient Active Problem List   Diagnosis Date Noted  . Pelvic pressure in female 05/11/2015  . Hematuria 05/11/2015  . Vaginal atrophy 05/11/2015  . Encounter for screening colonoscopy 10/08/2013  . Ankle arthritis 05/19/2013  . Left ankle strain 05/19/2013  . Left ankle pain 05/19/2013  . PRIMARY LOCALIZED OSTEOARTHROSIS LOWER LEG 04/15/2008  . BUCKET HANDLE TEAR OF LATERAL MENISCUS 04/15/2008  . TEAR LATERAL MENISCUS 04/15/2008  . KNEE, ARTHRITIS, DEGEN./OSTEO 01/08/2008  . DERANGEMENT MENISCUS 01/08/2008  . LOOSE BODY-KNEE 01/08/2008  . KNEE PAIN 01/08/2008    Past Surgical History:  Procedure Laterality Date  . ABDOMINAL HYSTERECTOMY    . ANKLE FRACTURE SURGERY     left  . APPENDECTOMY    . CATARACT EXTRACTION    . COLONOSCOPY  09/30/2003   RMR: Normal colon. Subtle abnormalities in the ileocecal valve and terminal  ileum as described above of doubtful clinical significance/Normal rectum  . COLONOSCOPY N/A 11/03/2013   Procedure:  COLONOSCOPY;  Surgeon: Corbin Ade, MD;  Location: [facility];  Service: Endoscopy;  Laterality: N/A;  830 - moved to 10:45 - Ginger to notify pt  . right leg fracture surgery     right  . TUBAL LIGATION       OB History    Gravida  1   Para  1   Term      Preterm      AB      Living  1     SAB      TAB      Ectopic      Multiple      Live Births               Home Medications    Prior to Admission medications   Medication Sig Start Date End Date Taking? Authorizing Provider  amitriptyline (ELAVIL) 10 MG tablet Take 30 mg by mouth at bedtime.   Yes [provider]  aspirin EC 81 MG tablet Take 81 mg by mouth daily.   Yes [provider]  diclofenac (VOLTAREN) 50 MG EC tablet Take 50 mg by mouth daily.    Yes [provider]  Multiple Vitamin (MULTIVITAMIN) capsule Take 1 capsule by mouth daily. Centrum   Yes [provider]  simvastatin (ZOCOR) 10 MG tablet Take 10 mg by mouth at bedtime.   Yes  [provider]  UNABLE TO FIND Vit B-12 injection-monthly   Yes [provider]    Family History Family History  Problem Relation Age of Onset  . Heart disease Daughter        transposition of great arteries/on fourth pacemaker  . Heart disease Mother   . Glaucoma Mother   . Arthritis Father        rheumatoid  . Alcohol abuse Father   . Diabetes Father   . Glaucoma Sister   . Arthritis Brother        rheumatoid  . Colon cancer Neg Hx   . Colon polyps Neg Hx   . Breast cancer Neg Hx   . Lung cancer Neg Hx     Social History Social History   Tobacco Use  . Smoking status: Never Smoker  . Smokeless tobacco: Never Used  Substance Use Topics  . Alcohol use: No  . Drug use: No     Allergies   Sulfa antibiotics   Review of Systems Review of Systems  Constitutional: Negative for fever.  HENT: Negative for congestion.   Eyes: Negative for visual disturbance.  Respiratory: Negative for  shortness of breath.   Cardiovascular: Negative for chest pain.  Gastrointestinal: Negative for abdominal pain, nausea and vomiting.  Genitourinary: Negative for dysuria.  Musculoskeletal: Positive for joint swelling. Negative for back pain and neck pain.  Skin: Negative for wound.  Neurological: Negative for syncope and headaches.  Hematological: Does not bruise/bleed easily.  Psychiatric/Behavioral: Negative for confusion.     Physical Exam Updated Vital Signs Ht 1.575 m (5\' 2" )   Wt 69.9 kg   BMI 28.17 kg/m   Physical Exam  Constitutional: She is oriented to person, place, and time. She appears well-developed and well-nourished. No distress.  HENT:  Head: Normocephalic and atraumatic.  Mouth/Throat: Oropharynx is clear and moist.  Eyes: Pupils are equal, round, and reactive to light. Conjunctivae and EOM are normal.  Neck: Normal range of motion. Neck supple.  Cardiovascular: Normal rate, regular rhythm and normal heart sounds.  Pulmonary/Chest: Effort normal and breath sounds normal.  Abdominal: Soft. Bowel sounds are normal. There is no tenderness.  Musculoskeletal: She exhibits edema and tenderness.  Good radial pulse good cap refill to the right hand.  Patient with slight deformity at the right wrist.  Tenderness to palpation over that area.  No elbow tenderness.  No swelling or deformity to the right shoulder but there is some pain with range of motion.  Neurological: She is alert and oriented to person, place, and time. No cranial nerve deficit or sensory deficit. She exhibits normal muscle tone. Coordination normal.  Skin: Skin is warm.  Nursing note and vitals reviewed.    ED Treatments / Results  Labs (all labs ordered are listed, but only abnormal results are displayed) Labs Reviewed - No data to display  EKG None  Radiology Dg Shoulder Right  Result Date: 09/04/2017 CLINICAL DATA:  Right shoulder pain secondary to a fall in her yard at home today. EXAM:  RIGHT SHOULDER - 2+ VIEW COMPARISON:  None. FINDINGS: There is no evidence of fracture or dislocation. Minimal AC joint arthropathy. Soft tissues are unremarkable. IMPRESSION: No acute abnormality.  Minimal AC joint arthropathy. Electronically Signed   By: Francene BoyersJames  Maxwell M.D.   On: 09/04/2017 11:01   Dg Forearm Right  Result Date: 09/04/2017 CLINICAL DATA:  Right forearm pain secondary to a fall in her yard this morning. EXAM: RIGHT FOREARM - 2  VIEW COMPARISON:  Wrist radiographs dated 09/04/2017 FINDINGS: There is a transverse fracture of the metaphysis of the distal right radius with slight dorsal impaction. The remainder of the radius appears normal. Ulna appears normal on these views although the wrist radiographs do demonstrate a small avulsion from the ulnar styloid. IMPRESSION: Slightly dorsally impacted fracture of the metaphysis of the distal right radius. The ulnar styloid avulsion noted on the wrist radiographs of this same date is not apparent on these radiographs. Electronically Signed   By: Francene BoyersJames  Maxwell M.D.   On: 09/04/2017 11:02   Dg Wrist Complete Right  Result Date: 09/04/2017 CLINICAL DATA:  Right wrist pain secondary to a fall today while working in her yard. EXAM: RIGHT WRIST - COMPLETE 3+ VIEW COMPARISON:  None. FINDINGS: There is a transverse fracture metaphysis of the distal right radius with slight dorsal impaction. There is a tiny avulsion from the ulnar styloid. No other acute bone abnormality. Severe arthritic changes at the [facility] and to a lesser degree between the scaphoid and trapezium and trapezoid. IMPRESSION: Acute fractures of the distal right radius and ulna as described. Electronically Signed   By: Francene BoyersJames  Maxwell M.D.   On: 09/04/2017 10:59    Procedures Procedures (including critical care time)  Medications Ordered in ED Medications - No data to display   Initial Impression / Assessment and Plan / ED Course  I have reviewed the triage vital signs  and the nursing notes.  Pertinent labs & imaging results that were available during my care of the patient were reviewed by me and considered in my medical decision making (see chart for details).    Patient x-rays depict a distal radius fracture with a slight fracture to the distal ulnar.  Slight displacement of the radial fracture.  Will place in a sugar tong splint sling and have her follow-up with her orthopedic doctor Dr. Romeo AppleHarrison.  Patient has not required any pain medicine here.  She took some Tylenol at home and states that that works just fine for her.  Patient instructed to elevate the right arm as much as possible.  No other apparent injuries.  Right shoulder x-rays were negative however rotator cuff injury not completely ruled out.  Orthopedics will be able to evaluate this further.   Patient had sugar tong splint applied.  Patient felt it was tight.  But examined has good cap refill to the fingers splint is on properly.  Reassured patient that it should be fine.  Patient given a prescription for some tramadol and given 1 dose of tramadol here prior to discharge.    Final Clinical Impressions(s) / ED Diagnoses   Final diagnoses:  Fall, initial encounter  Displaced physeal fracture of distal end of right radius, initial encounter  Other closed fracture of shaft of right ulna, initial encounter    ED Discharge Orders    None       Vanetta MuldersZackowski, Magin Balbi, MD 09/04/17 1251    Vanetta MuldersZackowski, Art Levan, MD 09/04/17 1554

## 2017-09-04 NOTE — ED Triage Notes (Signed)
Pt c/o right shoulder, forearm and wrist pain after fall today while working in her yard. Pt denies LOC.

## 2017-09-04 NOTE — Telephone Encounter (Signed)
Patient called wanting to see Dr. Romeo AppleHarrison this morning, stated she had fallen this morning and thinks her wrist is broken. I explained to her that Dr. Romeo AppleHarrison doesn't have any appointments available and for a same day appointment it would need to be ok'd by him. I asked her if she had been to see anyone for it and she stated "no, I don't want to go to the ER and then come over there". I suggested she may need to go to her PCP and she stated "he don't set bones". I suggested to her again that she would need to either see her PCP or go to the ER. She thanked me and hung up.

## 2017-09-04 NOTE — Discharge Instructions (Addendum)
Elevate the right arm is much as possible.  Keep the splint dry.  Use the sling to help support the arm.  Make an appointment to follow-up with your orthopedic doctor Dr. Romeo AppleHarrison.  Tylenol as needed for pain.  Return for any new or worse symptoms.  X-rays of your right shoulder were negative for any bony injuries.  This does not rule out any muscle or tendon injury to the shoulder.  Take the tramadol as needed for pain.

## 2017-09-07 ENCOUNTER — Ambulatory Visit: Payer: Medicare HMO | Admitting: Orthopedic Surgery

## 2017-09-07 ENCOUNTER — Encounter: Payer: Self-pay | Admitting: Orthopedic Surgery

## 2017-09-07 VITALS — BP 125/76 | HR 61 | Ht 63.0 in | Wt 153.0 lb

## 2017-09-07 DIAGNOSIS — S52531A Colles' fracture of right radius, initial encounter for closed fracture: Secondary | ICD-10-CM | POA: Diagnosis not present

## 2017-09-07 MED ORDER — ACETAMINOPHEN-CODEINE #3 300-30 MG PO TABS: 1.0000 | ORAL_TABLET | Freq: Four times a day (QID) | ORAL | 0 refills | Status: DC | PRN

## 2017-09-07 NOTE — Addendum Note (Signed)
Addended by: Vickki HearingHARRISON, Jasminne Mealy E on: 09/07/2017 08:05 PM   Modules accepted: Orders

## 2017-09-07 NOTE — Progress Notes (Addendum)
Patient ID: [name], female   DOB: 02/03/1940, 77 y.o.   MRN: [id number]  Chief Complaint  Patient presents with  . Wrist Injury    ER follow up on right wrist fracture, DOI 09-04-17.    HPI [name] is a 77 y.o. female.   77 years old fell at home injured her right wrist presents for evaluation and treatment  Pain located right wrist Time 3 days Quality dull pain Severity mild Associated with swelling and decreased range of motion   Review of Systems Review of Systems  Constitutional: Negative for activity change.  Respiratory: Negative for shortness of breath.   Cardiovascular: Negative for chest pain.  Neurological: Negative for numbness.     Past Medical History:  Diagnosis Date  . B12 deficiency    managed by Dr. Ouida SillsFagan: monthly injections  . Hematuria 05/11/2015  . Hemorrhoids   . Hyperlipemia   . Impaired fasting glucose   . Neuropathy    hereditary and idiopathic  . Osteoarthritis   . Pelvic pressure in female 05/11/2015  . Transient cerebral ischemic attack   . Vaginal atrophy 05/11/2015    Past Surgical History:  Procedure Laterality Date  . ABDOMINAL HYSTERECTOMY    . ANKLE FRACTURE SURGERY     left  . APPENDECTOMY    . CATARACT EXTRACTION    . COLONOSCOPY  09/30/2003   RMR: Normal colon. Subtle abnormalities in the ileocecal valve and terminal  ileum as described above of doubtful clinical significance/Normal rectum  . COLONOSCOPY N/A 11/03/2013   Procedure: COLONOSCOPY;  Surgeon: Corbin Adeobert M Rourk, MD;  Location: [facility];  Service: Endoscopy;  Laterality: N/A;  830 - moved to 10:45 - Ginger to notify pt  . right leg fracture surgery     right  . TUBAL LIGATION        Physical Exam 1 Blood pressure 125/76, pulse 61, height 5\' 3"  (1.6 m), weight 153 lb (69.4 kg). Physical Exam 2 The patient is well developed well nourished and well groomed. 3 Orientation to person place and time is normal  4 Mood is pleasant.  5 Ambulatory  status more gait  6 Inspection of the right wrist reveals mild distal radius tenderness   mild swelling no deformity 7 Range of motion assessment: The range of motion is diminished primarily secondary to pain 8 Stability tests are deferred because of pain but the x-ray shows no subluxation of the joint 9 Strength assessment muscle tone is normal resistance testing is deferred because of pain and swelling  10 Nerve function normal 11 Vascular function excellent pulse perfusion color capillary refill 12 Local lymphatic system epitrochlear nodes negative  Opposite extremity, right upper extremity, there is no alignment abnormality, no contracture, no subluxation, no atrophy and neurovascular exam is intact   MEDICAL DECISION SECTION  xrays ordered?  No  My independent reading of xrays: [facility] x-rays multiple views right distal radius nondisplaced fracture slight loss of volar tilt slight loss of radial inclination   Encounter Diagnosis  Name Primary?  . Closed Colles' fracture of right radius, initial encounter Yes     PLAN:   No orders of the defined types were placed in this encounter.  Application short arm cast right wrist  X-ray 2 weeks in the cast  Meds ordered this encounter  Medications  . acetaminophen-codeine (TYLENOL #3) 300-30 MG tablet    Sig: Take 1 tablet by mouth every 6 (six) hours as needed for up to  5 days for moderate pain.    Dispense:  20 tablet    Refill:  0    [email or web address] search completed

## 2017-09-08 ENCOUNTER — Encounter: Payer: Self-pay | Admitting: Orthopedic Surgery

## 2017-09-08 NOTE — Progress Notes (Signed)
Call received for tramadol not working   Called in tylenol wth cdeine \\[email or web address] search completed

## 2017-09-10 ENCOUNTER — Other Ambulatory Visit: Payer: Self-pay | Admitting: Orthopedic Surgery

## 2017-09-10 ENCOUNTER — Telehealth: Payer: Self-pay | Admitting: Orthopedic Surgery

## 2017-09-10 DIAGNOSIS — S52531A Colles' fracture of right radius, initial encounter for closed fracture: Secondary | ICD-10-CM

## 2017-09-10 MED ORDER — ACETAMINOPHEN-CODEINE #3 300-30 MG PO TABS: 1.0000 | ORAL_TABLET | Freq: Four times a day (QID) | ORAL | 0 refills | Status: AC | PRN

## 2017-09-10 NOTE — Telephone Encounter (Signed)
Tylenol with CDN printed, will you resend?

## 2017-09-10 NOTE — Telephone Encounter (Signed)
Ms. Iran OuchStrader states that Dr. Romeo AppleHarrison was to have sent in medication for her but [facility] told Ms. Ryans that they do not have anything for her from Dr. Romeo AppleHarrison.  Would you check this out for me please?  Thanks

## 2017-09-20 DIAGNOSIS — S52531A Colles' fracture of right radius, initial encounter for closed fracture: Secondary | ICD-10-CM | POA: Insufficient documentation

## 2017-09-21 ENCOUNTER — Ambulatory Visit (INDEPENDENT_AMBULATORY_CARE_PROVIDER_SITE_OTHER): Payer: Medicare HMO

## 2017-09-21 ENCOUNTER — Ambulatory Visit (INDEPENDENT_AMBULATORY_CARE_PROVIDER_SITE_OTHER): Payer: Medicare HMO | Admitting: Orthopedic Surgery

## 2017-09-21 DIAGNOSIS — S52532D Colles' fracture of left radius, subsequent encounter for closed fracture with routine healing: Secondary | ICD-10-CM

## 2017-09-21 DIAGNOSIS — S52531D Colles' fracture of right radius, subsequent encounter for closed fracture with routine healing: Secondary | ICD-10-CM

## 2017-09-21 NOTE — Progress Notes (Signed)
Fracture care follow-up  Chief Complaint  Patient presents with  . Arm Injury    Righ radius fx DOI 09/04/17   PID # [id number]  Encounter Diagnosis  Name Primary?  . Closed Colles' fracture of left radius with routine healing, subsequent encounter 09/04/17      Current Outpatient Medications:  .  amitriptyline (ELAVIL) 10 MG tablet, Take 30 mg by mouth at bedtime., Disp: , Rfl:  .  aspirin EC 81 MG tablet, Take 81 mg by mouth daily., Disp: , Rfl:  .  diclofenac (VOLTAREN) 50 MG EC tablet, Take 50 mg by mouth daily. , Disp: , Rfl:  .  Multiple Vitamin (MULTIVITAMIN) capsule, Take 1 capsule by mouth daily. Centrum, Disp: , Rfl:  .  simvastatin (ZOCOR) 10 MG tablet, Take 10 mg by mouth at bedtime., Disp: , Rfl:  .  traMADol (ULTRAM) 50 MG tablet, Take 1 tablet (50 mg total) by mouth every 6 (six) hours as needed., Disp: 15 tablet, Rfl: 0 .  UNABLE TO FIND, Vit B-12 injection-monthly, Disp: , Rfl:    Doing well not requiring any pain medicine except at night  BP 106/64   Pulse 64   Ht 5\' 3"  (1.6 m)   Wt 151 lb (68.5 kg)   BMI 26.75 kg/m   Physical Exam Cast and tight fingers are good color is good moving well  Xrays: Right distal radius fracture alignment maintained in the cast with mild dorsal tilt  Plan   Continue casting additional 4 to 6 weeks  X-ray out of plaster in 3 weeks

## 2017-10-05 DIAGNOSIS — E538 Deficiency of other specified B group vitamins: Secondary | ICD-10-CM | POA: Diagnosis not present

## 2017-10-09 DIAGNOSIS — G9009 Other idiopathic peripheral autonomic neuropathy: Secondary | ICD-10-CM | POA: Diagnosis not present

## 2017-10-09 DIAGNOSIS — S52531D Colles' fracture of right radius, subsequent encounter for closed fracture with routine healing: Secondary | ICD-10-CM | POA: Diagnosis not present

## 2017-10-12 ENCOUNTER — Encounter: Payer: Self-pay | Admitting: Orthopedic Surgery

## 2017-10-12 ENCOUNTER — Ambulatory Visit (INDEPENDENT_AMBULATORY_CARE_PROVIDER_SITE_OTHER): Payer: Medicare HMO | Admitting: Orthopedic Surgery

## 2017-10-12 ENCOUNTER — Ambulatory Visit (INDEPENDENT_AMBULATORY_CARE_PROVIDER_SITE_OTHER): Payer: Medicare HMO

## 2017-10-12 VITALS — BP 107/62 | HR 59 | Ht 63.0 in | Wt 151.0 lb

## 2017-10-12 DIAGNOSIS — S52532D Colles' fracture of left radius, subsequent encounter for closed fracture with routine healing: Secondary | ICD-10-CM

## 2017-10-12 DIAGNOSIS — S52531D Colles' fracture of right radius, subsequent encounter for closed fracture with routine healing: Secondary | ICD-10-CM

## 2017-10-12 NOTE — Progress Notes (Signed)
Chief Complaint  Patient presents with  . Arm Injury    Colles' fracture right wrist August 13  5 weeks in a cast x-ray shows slight shortening fracture appears to be healing, some dorsal tilt  Mild tenderness at fracture site more at the wrist joint with splint for 3 to 4 weeks and x-ray again out of the splint  Encounter Diagnosis  Name Primary?  . Closed Colles' fracture of left radius with routine healing, subsequent encounter 09/04/17 Yes

## 2017-10-25 ENCOUNTER — Other Ambulatory Visit (HOSPITAL_COMMUNITY): Payer: Self-pay | Admitting: Internal Medicine

## 2017-10-25 DIAGNOSIS — Z780 Asymptomatic menopausal state: Secondary | ICD-10-CM

## 2017-11-01 DIAGNOSIS — Z23 Encounter for immunization: Secondary | ICD-10-CM | POA: Diagnosis not present

## 2017-11-09 ENCOUNTER — Ambulatory Visit (INDEPENDENT_AMBULATORY_CARE_PROVIDER_SITE_OTHER): Payer: Medicare HMO

## 2017-11-09 ENCOUNTER — Encounter: Payer: Self-pay | Admitting: Orthopedic Surgery

## 2017-11-09 ENCOUNTER — Ambulatory Visit (INDEPENDENT_AMBULATORY_CARE_PROVIDER_SITE_OTHER): Payer: Medicare HMO | Admitting: Orthopedic Surgery

## 2017-11-09 VITALS — BP 125/75 | HR 54 | Ht 63.0 in | Wt 152.0 lb

## 2017-11-09 DIAGNOSIS — S52532D Colles' fracture of left radius, subsequent encounter for closed fracture with routine healing: Secondary | ICD-10-CM

## 2017-11-09 DIAGNOSIS — S52531D Colles' fracture of right radius, subsequent encounter for closed fracture with routine healing: Secondary | ICD-10-CM

## 2017-11-09 DIAGNOSIS — S46011A Strain of muscle(s) and tendon(s) of the rotator cuff of right shoulder, initial encounter: Secondary | ICD-10-CM | POA: Diagnosis not present

## 2017-11-09 DIAGNOSIS — E538 Deficiency of other specified B group vitamins: Secondary | ICD-10-CM | POA: Diagnosis not present

## 2017-11-09 DIAGNOSIS — M25511 Pain in right shoulder: Secondary | ICD-10-CM

## 2017-11-09 NOTE — Progress Notes (Signed)
Chief Complaint  Patient presents with  . Wrist Injury    Right DOI 09/04/17  . Shoulder Pain    Still having pain and stiffness after injury    Right wrist fracture patient did not want surgery she is approximate 8 to 9 weeks out has no complaints of pain x-ray today shows fracture healing with residual dorsal tilt to the articular surface and mild shortening  Patient complains of persistent right shoulder pain pain is located over the right deltoid.  She has a dull throbbing sensation over the right deltoid it is severe to keep her up at night and wake her up it is constant is worse with movement and she does note decreased range of motion with no improvement since her fall back on August 13  Review of Systems  Constitutional: Negative for chills and fever.  Skin: Negative for rash.  Neurological: Negative for tingling, tremors and sensory change.   Past Medical History:  Diagnosis Date  . B12 deficiency    managed by Dr. Ouida Sills: monthly injections  . Hematuria 05/11/2015  . Hemorrhoids   . Hyperlipemia   . Impaired fasting glucose   . Neuropathy    hereditary and idiopathic  . Osteoarthritis   . Pelvic pressure in female 05/11/2015  . Transient cerebral ischemic attack   . Vaginal atrophy 05/11/2015   BP 125/75   Pulse (!) 54   Ht 5\' 3"  (1.6 m)   Wt 152 lb (68.9 kg)   BMI 26.93 kg/m   Her appearance is normal well-groomed no distress She is oriented to person place and time Mood pleasant affect normal Gait and station remain normal as well  Lengths look at the right shoulder.  She has tenderness around the right shoulder around the acromion.  No malalignment.  Her active range of motion shows only for 45 degrees abduction with passive range of motion 90 degrees abduction with pain she has a painful arc of motion in flexion from 7250 degrees.  She has a positive drop arm test no instability detected weakness globally in the rotator cuff especially the supraspinatus grade 3  infraspinatus grade 4 subscapularis grade 5 Skin normal Pulse normal temperature normal Supraclavicular lymph nodes normal Sensation normal  Left shoulder full range of motion no tenderness  Encounter Diagnoses  Name Primary?  . Closed Colles' fracture of right radius with routine healing, subsequent encounter 09/04/17 Yes  . Pain in joint of right shoulder   . Traumatic complete tear of right rotator cuff, initial encounter     The patient fell and injured her wrist she notes that at that time her right shoulder had a lot of bruising in the skin and that cleared up with the pain and range of motion never improved  Plain films show possible high riding humerus no fracture or dislocation the arm was internally rotated at the time of x-ray  Recommend MRI and subacromial injection  Patient agreed to subacromial injection right shoulder   Procedure note the subacromial injection shoulder RIGHT  Verbal consent was obtained to inject the  RIGHT   Shoulder  Timeout was completed to confirm the injection site is a subacromial space of the  RIGHT  shoulder   Medication used Depo-Medrol 40 mg and lidocaine 1% 3 cc  Anesthesia was provided by ethyl chloride  The injection was performed in the RIGHT  posterior subacromial space. After pinning the skin with alcohol and anesthetized the skin with ethyl chloride the subacromial space was injected using  a 20-gauge needle. There were no complications  Sterile dressing was applied.  MRI patient has rotator cuff tear most likely and will need surgery

## 2017-11-13 ENCOUNTER — Telehealth: Payer: Self-pay | Admitting: Radiology

## 2017-11-13 NOTE — Telephone Encounter (Signed)
-----   Message from Doristine Section sent at 11/12/2017  4:47 PM EDT ----- Regarding: Call from [facility] Farah Lepak,  RE: AOKI, WEDEMEYER [[phone]]  - Voice message rec'd from [facility], ph# [phone]   - said MRI RT shoulder needs pre-cert through [facility] (ins is [facility]) as noted. It is scheduled 11/16/17   Thanks, Okey Regal

## 2017-11-13 NOTE — Telephone Encounter (Signed)
Berkley Harvey number is [phone]

## 2017-11-14 ENCOUNTER — Ambulatory Visit (HOSPITAL_COMMUNITY)
Admission: RE | Admit: 2017-11-14 | Discharge: 2017-11-14 | Disposition: A | Discharge status: Home / Self Care | Payer: Medicare HMO | Source: Ambulatory Visit | Attending: Orthopedic Surgery | Admitting: Orthopedic Surgery

## 2017-11-14 ENCOUNTER — Ambulatory Visit (HOSPITAL_COMMUNITY)
Admission: RE | Admit: 2017-11-14 | Discharge: 2017-11-14 | Disposition: A | Discharge status: Home / Self Care | Payer: Medicare HMO | Source: Ambulatory Visit | Attending: Internal Medicine | Admitting: Internal Medicine

## 2017-11-14 DIAGNOSIS — M75121 Complete rotator cuff tear or rupture of right shoulder, not specified as traumatic: Secondary | ICD-10-CM | POA: Diagnosis not present

## 2017-11-14 DIAGNOSIS — Z780 Asymptomatic menopausal state: Secondary | ICD-10-CM | POA: Insufficient documentation

## 2017-11-14 DIAGNOSIS — M25511 Pain in right shoulder: Secondary | ICD-10-CM

## 2017-11-14 DIAGNOSIS — M19011 Primary osteoarthritis, right shoulder: Secondary | ICD-10-CM | POA: Insufficient documentation

## 2017-11-14 DIAGNOSIS — M85832 Other specified disorders of bone density and structure, left forearm: Secondary | ICD-10-CM | POA: Diagnosis not present

## 2017-11-14 DIAGNOSIS — M85852 Other specified disorders of bone density and structure, left thigh: Secondary | ICD-10-CM | POA: Diagnosis not present

## 2017-11-16 ENCOUNTER — Ambulatory Visit (HOSPITAL_COMMUNITY): Payer: Medicare HMO

## 2017-11-19 ENCOUNTER — Encounter: Payer: Self-pay | Admitting: Orthopedic Surgery

## 2017-11-19 ENCOUNTER — Ambulatory Visit (INDEPENDENT_AMBULATORY_CARE_PROVIDER_SITE_OTHER): Payer: Medicare HMO | Admitting: Orthopedic Surgery

## 2017-11-19 VITALS — BP 128/68 | HR 59 | Ht 63.0 in | Wt 152.0 lb

## 2017-11-19 DIAGNOSIS — S46011D Strain of muscle(s) and tendon(s) of the rotator cuff of right shoulder, subsequent encounter: Secondary | ICD-10-CM

## 2017-11-19 NOTE — Progress Notes (Signed)
Chief Complaint  Patient presents with  . Shoulder Pain    Right shoulder     [name] comes in for reevaluation of her right shoulder after MRI.  She injured her shoulder when she fractured her wrist about 3 months ago.  She had had no prior shoulder problems and was working out at [employer]  Review of systems no chest pain or shortness of breath should be a good candidate for surgery  My independent review of her MRI:  Her MRI was reviewed with the corresponding report she basically has a massive rotator cuff tear acute on chronic with severe muscle atrophy and a subscapularis tear which is probably new  She has proximal migration of the humeral head and a tear of the biceps from the superior labrum  The high riding humeral head suggest chronic disease in the 2 to 3 cm retraction of the subscapularis suggest a more acute tear although the atrophy in the subscapularis is concerning for chronic disease  In any event I discussed this with her and says she probably is a good candidate for reverse shoulder replacement and since I do not do that procedure I recommended she see Dr. Dorene Grebe who is been kind enough to see these patients for [place]  So referral will be made  Encounter Diagnosis  Name Primary?  . Traumatic complete tear of right rotator cuff, subsequent encounter Yes

## 2017-11-28 ENCOUNTER — Ambulatory Visit (INDEPENDENT_AMBULATORY_CARE_PROVIDER_SITE_OTHER): Payer: Medicare HMO | Admitting: Orthopedic Surgery

## 2017-11-28 ENCOUNTER — Encounter (INDEPENDENT_AMBULATORY_CARE_PROVIDER_SITE_OTHER): Payer: Self-pay | Admitting: Orthopedic Surgery

## 2017-11-28 DIAGNOSIS — M12811 Other specific arthropathies, not elsewhere classified, right shoulder: Secondary | ICD-10-CM

## 2017-11-28 DIAGNOSIS — M25511 Pain in right shoulder: Secondary | ICD-10-CM

## 2017-11-28 NOTE — Progress Notes (Signed)
Office Visit Note   Patient: [name]           Date of Birth: 1940-03-17           MRN: [id number] Visit Date: 11/28/2017 Requested by: Carylon Perches, MD [address] [place], [place] [address] PCP: Carylon Perches, MD  Subjective: Chief Complaint  Patient presents with  . Right Shoulder - Follow-up, Pain    HPI: [name] is a patient with right shoulder pain.  She had distal radius ulnar fracture treated over the past several months.  Her fall was 09/04/2017.  She is right-hand dominant.  She is here with her daughter.  She lives alone in [place].  Her daughter is nearby.  She takes Tylenol with marginal relief.  She reports right shoulder pain clicking and catching.  MRI scan is reviewed.  She has tear of the supraspinatus and infraspinatus with retraction along with what appears to be possibly new tear of the subscapularis also with about 2 to 3 cm of retraction.  She does describe having some weakness in that shoulder before the fall.  She did develop some bruising around the proximal arm after the fall.  She describes having night pain.  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  She likes doing senior exercise programs in her hometown..              ROS: All systems reviewed are negative as they relate to the chief complaint within the history of present illness.  Patient denies  fevers or chills.   Assessment & Plan: Visit Diagnoses:  1. Rotator cuff arthropathy of right shoulder   2. Right shoulder pain, unspecified chronicity     Plan: Impression is right shoulder pain rotator cuff arthropathy with likely new subscapularis tear which is not repairable.  The patient is able to get her hand to the top of her head.  She has about slightly less than 90 degrees of forward flexion and abduction.  She does have weakness and pain as well.  Discussed with her the risk and benefits of surgical intervention which would be reverse shoulder replacement.  Plan thin  cut CT scan.  She needs to decide if she can live with this amount of function and disability and whether or not her pain will improve any.  Currently her pain is not improved much over the past 3 months.  I think this is something we can get a better handle on after we see her back following the thin cut CT scan.  In general I think her preference is not to have surgery based on my discussions but I think that if she is not happy with the amount of pain and functional loss that she has that reverse shoulder replacement could improve that.  Follow-Up Instructions: Return in about 4 weeks (around 12/26/2017).   Orders:  Orders Placed This Encounter  Procedures  . CT SHOULDER RIGHT WO CONTRAST   No orders of the defined types were placed in this encounter.     Procedures: No procedures performed   Clinical Data: No additional findings.  Objective: Vital Signs: There were no vitals taken for this visit.  Physical Exam:   Constitutional: Patient appears well-developed HEENT:  Head: Normocephalic Eyes:EOM are normal Neck: Normal range of motion Cardiovascular: Normal rate Pulmonary/chest: Effort normal Neurologic: Patient is alert Skin: Skin is warm Psychiatric: Patient has normal mood and affect    Ortho Exam: Ortho exam demonstrates good cervical  spine range of motion.  Patient has 5 out of 5 grip EPL FPL interosseous wrist flexion extension bicep triceps and deltoid strength.  Radial pulse intact bilaterally.  Patient does have weakness on the right infra spinatus and supraspinatus testing.  Subscap strength is actually about 4 out of 5.  She has no masses lymph adenopathy or skin changes noted in the right shoulder girdle region.  Axillary nerve is functional.  Specialty Comments:  No specialty comments available.  Imaging: No results found.   PMFS History: Patient Active Problem List   Diagnosis Date Noted  . Colles' fracture of right radius 09/04/17 09/20/2017  .  Pelvic pressure in female 05/11/2015  . Hematuria 05/11/2015  . Vaginal atrophy 05/11/2015  . Encounter for screening colonoscopy 10/08/2013  . Ankle arthritis 05/19/2013  . Left ankle strain 05/19/2013  . Left ankle pain 05/19/2013  . PRIMARY LOCALIZED OSTEOARTHROSIS LOWER LEG 04/15/2008  . BUCKET HANDLE TEAR OF LATERAL MENISCUS 04/15/2008  . TEAR LATERAL MENISCUS 04/15/2008  . KNEE, ARTHRITIS, DEGEN./OSTEO 01/08/2008  . DERANGEMENT MENISCUS 01/08/2008  . LOOSE BODY-KNEE 01/08/2008  . KNEE PAIN 01/08/2008   Past Medical History:  Diagnosis Date  . B12 deficiency    managed by Dr. Ouida Sills: monthly injections  . Hematuria 05/11/2015  . Hemorrhoids   . Hyperlipemia   . Impaired fasting glucose   . Neuropathy    hereditary and idiopathic  . Osteoarthritis   . Pelvic pressure in female 05/11/2015  . Transient cerebral ischemic attack   . Vaginal atrophy 05/11/2015    Family History  Problem Relation Age of Onset  . Heart disease Daughter        transposition of great arteries/on fourth pacemaker  . Heart disease Mother   . Glaucoma Mother   . Arthritis Father        rheumatoid  . Alcohol abuse Father   . Diabetes Father   . Glaucoma Sister   . Arthritis Brother        rheumatoid  . Colon cancer Neg Hx   . Colon polyps Neg Hx   . Breast cancer Neg Hx   . Lung cancer Neg Hx     Past Surgical History:  Procedure Laterality Date  . ABDOMINAL HYSTERECTOMY    . ANKLE FRACTURE SURGERY     left  . APPENDECTOMY    . CATARACT EXTRACTION    . COLONOSCOPY  09/30/2003   RMR: Normal colon. Subtle abnormalities in the ileocecal valve and terminal  ileum as described above of doubtful clinical significance/Normal rectum  . COLONOSCOPY N/A 11/03/2013   Procedure: COLONOSCOPY;  Surgeon: Corbin Ade, MD;  [facility];  Service: Endoscopy;  Laterality: N/A;  830 - moved to 10:45 - Ginger to notify pt  . right leg fracture surgery     right  . TUBAL LIGATION     Social  History   Occupational History  . Not on file  Tobacco Use  . Smoking status: Never Smoker  . Smokeless tobacco: Never Used  Substance and Sexual Activity  . Alcohol use: No  . Drug use: No  . Sexual activity: Not Currently    Birth control/protection: Surgical    Comment: hyst

## 2017-12-03 ENCOUNTER — Telehealth (INDEPENDENT_AMBULATORY_CARE_PROVIDER_SITE_OTHER): Payer: Self-pay | Admitting: Orthopedic Surgery

## 2017-12-03 NOTE — Telephone Encounter (Signed)
Called patient left message on voicemail to return call to schedule appointment for CT review with Dr August Saucer

## 2017-12-10 ENCOUNTER — Ambulatory Visit (HOSPITAL_COMMUNITY)
Admission: RE | Admit: 2017-12-10 | Discharge: 2017-12-10 | Disposition: A | Discharge status: Home / Self Care | Payer: Medicare HMO | Source: Ambulatory Visit | Attending: Orthopedic Surgery | Admitting: Orthopedic Surgery

## 2017-12-10 DIAGNOSIS — M25511 Pain in right shoulder: Secondary | ICD-10-CM | POA: Insufficient documentation

## 2017-12-10 DIAGNOSIS — M19011 Primary osteoarthritis, right shoulder: Secondary | ICD-10-CM | POA: Diagnosis not present

## 2017-12-10 DIAGNOSIS — Z01818 Encounter for other preprocedural examination: Secondary | ICD-10-CM | POA: Diagnosis not present

## 2017-12-10 DIAGNOSIS — M75121 Complete rotator cuff tear or rupture of right shoulder, not specified as traumatic: Secondary | ICD-10-CM | POA: Insufficient documentation

## 2017-12-11 DIAGNOSIS — E538 Deficiency of other specified B group vitamins: Secondary | ICD-10-CM | POA: Diagnosis not present

## 2017-12-12 ENCOUNTER — Encounter (INDEPENDENT_AMBULATORY_CARE_PROVIDER_SITE_OTHER): Payer: Self-pay | Admitting: Orthopedic Surgery

## 2017-12-12 ENCOUNTER — Ambulatory Visit (INDEPENDENT_AMBULATORY_CARE_PROVIDER_SITE_OTHER): Payer: Medicare HMO | Admitting: Orthopedic Surgery

## 2017-12-12 DIAGNOSIS — M12811 Other specific arthropathies, not elsewhere classified, right shoulder: Secondary | ICD-10-CM

## 2017-12-12 NOTE — Progress Notes (Signed)
Office Visit Note   Patient: [name]           Date of Birth: [birth year]           MRN: [id number] Visit Date: 12/12/2017 Requested by: Carylon Perches, MD [address] [place], [place] [address] PCP: Carylon Perches, MD  Subjective: Chief Complaint  Patient presents with  . Right Shoulder - Pain    HPI: [name] is a patient with right shoulder pain.  She has rotator cuff arthropathy.  She is taking diclofenac about 1/day.  Left shoulder is actually functional and intact.  She does live alone but her daughter lives nearby.  She is also takes Tylenol extra strength about 1/day.              ROS: All systems reviewed are negative as they relate to the chief complaint within the history of present illness.  Patient denies  fevers or chills.   Assessment & Plan: Visit Diagnoses:  1. Rotator cuff arthropathy of right shoulder     Plan: Impression is rotator cuff arthropathy in the right shoulder with about 50 degrees of isolated glenohumeral abduction and forward flexion.  In general her left shoulder is functional and she wants to continue with her right shoulder as it is.  I think because her left shoulder is functional she may get by with this.  We can do a reverse shoulder replacement on her realistically at any time should her symptoms worsen.  She does have adequate bone stock for the replacement.  I am going to see her back as needed.  Follow-Up Instructions: Return if symptoms worsen or fail to improve.   Orders:  No orders of the defined types were placed in this encounter.  No orders of the defined types were placed in this encounter.     Procedures: No procedures performed   Clinical Data: No additional findings.  Objective: Vital Signs: There were no vitals taken for this visit.  Physical Exam:   Constitutional: Patient appears well-developed HEENT:  Head: Normocephalic Eyes:EOM are normal Neck: Normal range of motion Cardiovascular: Normal  rate Pulmonary/chest: Effort normal Neurologic: Patient is alert Skin: Skin is warm Psychiatric: Patient has normal mood and affect    Ortho Exam: Ortho exam demonstrates about 50 degrees of isolated glenohumeral abduction and forward flexion.  She does have passive range of motion both above 90 degrees easily.  Motor or sensory function to the hand is intact.  She does have a little bit of grinding with passive range of motion closer to 90 degrees with assistance.  Specialty Comments:  No specialty comments available.  Imaging: No results found.   PMFS History: Patient Active Problem List   Diagnosis Date Noted  . Colles' fracture of right radius 09/04/17 09/20/2017  . Pelvic pressure in female 05/11/2015  . Hematuria 05/11/2015  . Vaginal atrophy 05/11/2015  . Encounter for screening colonoscopy 10/08/2013  . Ankle arthritis 05/19/2013  . Left ankle strain 05/19/2013  . Left ankle pain 05/19/2013  . PRIMARY LOCALIZED OSTEOARTHROSIS LOWER LEG 04/15/2008  . BUCKET HANDLE TEAR OF LATERAL MENISCUS 04/15/2008  . TEAR LATERAL MENISCUS 04/15/2008  . KNEE, ARTHRITIS, DEGEN./OSTEO 01/08/2008  . DERANGEMENT MENISCUS 01/08/2008  . LOOSE BODY-KNEE 01/08/2008  . KNEE PAIN 01/08/2008   Past Medical History:  Diagnosis Date  . B12 deficiency    managed by Dr. Ouida Sills: monthly injections  . Hematuria 05/11/2015  . Hemorrhoids   . Hyperlipemia   . Impaired fasting  glucose   . Neuropathy    hereditary and idiopathic  . Osteoarthritis   . Pelvic pressure in female 05/11/2015  . Transient cerebral ischemic attack   . Vaginal atrophy 05/11/2015    Family History  Problem Relation Age of Onset  . Heart disease Daughter        transposition of great arteries/on fourth pacemaker  . Heart disease Mother   . Glaucoma Mother   . Arthritis Father        rheumatoid  . Alcohol abuse Father   . Diabetes Father   . Glaucoma Sister   . Arthritis Brother        rheumatoid  . Colon cancer  Neg Hx   . Colon polyps Neg Hx   . Breast cancer Neg Hx   . Lung cancer Neg Hx     Past Surgical History:  Procedure Laterality Date  . ABDOMINAL HYSTERECTOMY    . ANKLE FRACTURE SURGERY     left  . APPENDECTOMY    . CATARACT EXTRACTION    . COLONOSCOPY  09/30/2003   RMR: Normal colon. Subtle abnormalities in the ileocecal valve and terminal  ileum as described above of doubtful clinical significance/Normal rectum  . COLONOSCOPY N/A 11/03/2013   Procedure: COLONOSCOPY;  Surgeon: Corbin Adeobert M Rourk, MD;  Location[facility];  Service: Endoscopy;  Laterality: N/A;  830 - moved to 10:45 - Ginger to notify pt  . right leg fracture surgery     right  . TUBAL LIGATION     Social History   Occupational History  . Not on file  Tobacco Use  . Smoking status: Never Smoker  . Smokeless tobacco: Never Used  Substance and Sexual Activity  . Alcohol use: No  . Drug use: No  . Sexual activity: Not Currently    Birth control/protection: Surgical    Comment: hyst

## 2018-01-11 DIAGNOSIS — E538 Deficiency of other specified B group vitamins: Secondary | ICD-10-CM | POA: Diagnosis not present

## 2018-01-29 DIAGNOSIS — G9009 Other idiopathic peripheral autonomic neuropathy: Secondary | ICD-10-CM | POA: Diagnosis not present

## 2018-01-29 DIAGNOSIS — M8580 Other specified disorders of bone density and structure, unspecified site: Secondary | ICD-10-CM | POA: Diagnosis not present

## 2018-02-07 DIAGNOSIS — Z961 Presence of intraocular lens: Secondary | ICD-10-CM | POA: Diagnosis not present

## 2018-02-08 ENCOUNTER — Telehealth (INDEPENDENT_AMBULATORY_CARE_PROVIDER_SITE_OTHER): Payer: Self-pay | Admitting: Orthopedic Surgery

## 2018-02-08 NOTE — Telephone Encounter (Signed)
Patient called stated she has decided to move forward with the surgery with shoulder replacement.  Please call patient to advise.  [phone]

## 2018-02-08 NOTE — Telephone Encounter (Signed)
Please advise 

## 2018-02-11 NOTE — Telephone Encounter (Signed)
Please call today.  Blue sheet done.

## 2018-02-18 DIAGNOSIS — E538 Deficiency of other specified B group vitamins: Secondary | ICD-10-CM | POA: Diagnosis not present

## 2018-03-12 ENCOUNTER — Other Ambulatory Visit (HOSPITAL_COMMUNITY): Payer: Self-pay | Admitting: Internal Medicine

## 2018-03-12 DIAGNOSIS — Z1231 Encounter for screening mammogram for malignant neoplasm of breast: Secondary | ICD-10-CM

## 2018-03-20 ENCOUNTER — Other Ambulatory Visit (INDEPENDENT_AMBULATORY_CARE_PROVIDER_SITE_OTHER): Payer: Self-pay | Admitting: Orthopedic Surgery

## 2018-03-20 DIAGNOSIS — M19011 Primary osteoarthritis, right shoulder: Secondary | ICD-10-CM

## 2018-03-26 DIAGNOSIS — E538 Deficiency of other specified B group vitamins: Secondary | ICD-10-CM | POA: Diagnosis not present

## 2018-04-03 ENCOUNTER — Encounter (HOSPITAL_COMMUNITY): Payer: Self-pay

## 2018-04-03 ENCOUNTER — Other Ambulatory Visit: Payer: Self-pay

## 2018-04-03 ENCOUNTER — Ambulatory Visit (HOSPITAL_COMMUNITY)
Admission: RE | Admit: 2018-04-03 | Discharge: 2018-04-03 | Disposition: A | Discharge status: Home / Self Care | Payer: PPO | Source: Ambulatory Visit | Attending: Internal Medicine | Admitting: Internal Medicine

## 2018-04-03 DIAGNOSIS — Z1231 Encounter for screening mammogram for malignant neoplasm of breast: Secondary | ICD-10-CM | POA: Insufficient documentation

## 2018-04-08 NOTE — Pre-Procedure Instructions (Signed)
[name]  04/08/2018      [id number] - [place], [place] - [name] [place] [address] Phone: [phone] Fax: [phone]    Your procedure is scheduled on March 24  Report to [facility] Admitting at 0530 A.M.  Call this number if you have problems the morning of surgery:  [phone]   Remember:  Do not eat or drink after midnight.    Take these medicines the morning of surgery with A SIP OF WATER  acetaminophen (TYLENOL)  If needed pregabalin (LYRICA)  7 days prior to surgery STOP taking any diclofenac (VOLTAREN),  Aspirin (unless otherwise instructed by your surgeon), Aleve, Naproxen, Ibuprofen, Motrin, Advil, Goody's, BC's, all herbal medications, fish oil, and all vitamins.     Do not wear jewelry, make-up or nail polish.  Do not wear lotions, powders, or perfumes, or deodorant.  Do not shave 48 hours prior to surgery.    Do not bring valuables to the hospital.  [facility] is not responsible for any belongings or valuables.  Contacts, dentures or bridgework may not be worn into surgery.  Leave your suitcase in the car.  After surgery it may be brought to your room.  For patients admitted to the hospital, discharge time will be determined by your treatment team.  Patients discharged the day of surgery will not be allowed to drive home.    Special instructions:   [place]- Preparing For Surgery  Before surgery, you can play an important role. Because skin is not sterile, your skin needs to be as free of germs as possible. You can reduce the number of germs on your skin by washing with CHG (chlorahexidine gluconate) Soap before surgery.  CHG is an antiseptic cleaner which kills germs and bonds with the skin to continue killing germs even after washing.    Oral Hygiene is also important to reduce your risk of infection.  Remember - BRUSH YOUR TEETH THE MORNING OF  SURGERY WITH YOUR REGULAR TOOTHPASTE  Please do not use if you have an allergy to CHG or antibacterial soaps. If your skin becomes reddened/irritated stop using the CHG.  Do not shave (including legs and underarms) for at least 48 hours prior to first CHG shower. It is OK to shave your face.  Please follow these instructions carefully.   1. Shower the NIGHT BEFORE SURGERY and the MORNING OF SURGERY with CHG.   2. If you chose to wash your hair, wash your hair first as usual with your normal shampoo.  3. After you shampoo, rinse your hair and body thoroughly to remove the shampoo.  4. Use CHG as you would any other liquid soap. You can apply CHG directly to the skin and wash gently with a scrungie or a clean washcloth.   5. Apply the CHG Soap to your body ONLY FROM THE NECK DOWN.  Do not use on open wounds or open sores. Avoid contact with your eyes, ears, mouth and genitals (private parts). Wash Face and genitals (private parts)  with your normal soap.  6. Wash thoroughly, paying special attention to the area where your surgery will be performed.  7. Thoroughly rinse your body with warm water from the neck down.  8. DO NOT shower/wash with your normal soap after using and rinsing off the CHG Soap.  9. Pat yourself dry with a CLEAN TOWEL.  10. Wear CLEAN PAJAMAS to bed the night before surgery, wear comfortable clothes the morning of surgery  11. Place CLEAN SHEETS on your bed the night of your first shower and DO NOT SLEEP WITH PETS.    Day of Surgery:  Do not apply any deodorants/lotions.  Please wear clean clothes to the hospital/surgery center.   Remember to brush your teeth WITH YOUR REGULAR TOOTHPASTE.    Please read over the following fact sheets that you were given.

## 2018-04-09 ENCOUNTER — Other Ambulatory Visit: Payer: Self-pay

## 2018-04-09 ENCOUNTER — Encounter (HOSPITAL_COMMUNITY): Payer: Self-pay

## 2018-04-09 ENCOUNTER — Encounter (HOSPITAL_COMMUNITY)
Admission: RE | Admit: 2018-04-09 | Discharge: 2018-04-09 | Disposition: A | Discharge status: Home / Self Care | Payer: PPO | Source: Ambulatory Visit | Attending: Orthopedic Surgery | Admitting: Orthopedic Surgery

## 2018-04-09 DIAGNOSIS — Z01812 Encounter for preprocedural laboratory examination: Secondary | ICD-10-CM | POA: Insufficient documentation

## 2018-04-09 HISTORY — DX: Other specified postprocedural states: Z98.890

## 2018-04-09 HISTORY — DX: Nausea with vomiting, unspecified: R11.2

## 2018-04-09 LAB — CBC
HCT: 41.4 % (ref 36.0–46.0)
Hemoglobin: 14.6 g/dL (ref 12.0–15.0)
MCH: 32.1 pg (ref 26.0–34.0)
MCHC: 35.3 g/dL (ref 30.0–36.0)
MCV: 91 fL (ref 80.0–100.0)
Platelets: 173 10*3/uL (ref 150–400)
RBC: 4.55 MIL/uL (ref 3.87–5.11)
RDW: 13.2 % (ref 11.5–15.5)
WBC: 7.2 10*3/uL (ref 4.0–10.5)
nRBC: 0 % (ref 0.0–0.2)

## 2018-04-09 LAB — URINALYSIS, ROUTINE W REFLEX MICROSCOPIC
Bilirubin Urine: NEGATIVE
Glucose, UA: NEGATIVE mg/dL
Hgb urine dipstick: NEGATIVE
KETONES UR: NEGATIVE mg/dL
Leukocytes,Ua: NEGATIVE
Nitrite: NEGATIVE
Protein, ur: NEGATIVE mg/dL
Specific Gravity, Urine: 1.006 (ref 1.005–1.030)
pH: 5 (ref 5.0–8.0)

## 2018-04-09 LAB — BASIC METABOLIC PANEL
Anion gap: 9 (ref 5–15)
BUN: 15 mg/dL (ref 8–23)
CO2: 24 mmol/L (ref 22–32)
Calcium: 9.4 mg/dL (ref 8.9–10.3)
Chloride: 105 mmol/L (ref 98–111)
Creatinine, Ser: 0.8 mg/dL (ref 0.44–1.00)
GFR calc Af Amer: >60 mL/min (ref >60)
GFR calc non Af Amer: >60 mL/min (ref >60)
Glucose, Bld: 98 mg/dL (ref 70–99)
POTASSIUM: 4 mmol/L (ref 3.5–5.1)
Sodium: 138 mmol/L (ref 135–145)

## 2018-04-09 LAB — SURGICAL PCR SCREEN
MRSA, PCR: NEGATIVE
Staphylococcus aureus: NEGATIVE

## 2018-04-09 NOTE — Progress Notes (Signed)
PCP - Carylon Perches Cardiologist - denies  Chest x-ray - not needed EKG - requesting Stress Test - denies ECHO - denies Cardiac Cath - denies  Aspirin Instructions:last dose 04/09/18  Anesthesia review: yes, ekg requested  Patient denies shortness of breath, fever, cough and chest pain at PAT appointment   Patient verbalized understanding of instructions that were given to them at the PAT appointment. Patient was also instructed that they will need to review over the PAT instructions again at home before surgery.

## 2018-04-10 ENCOUNTER — Encounter (HOSPITAL_COMMUNITY): Payer: Self-pay | Admitting: Physician Assistant

## 2018-04-10 LAB — URINE CULTURE

## 2018-04-10 NOTE — Anesthesia Preprocedure Evaluation (Deleted)
Anesthesia Evaluation Anesthesia Physical Anesthesia Plan  ASA:   Anesthesia Plan:    Post-op Pain Management:    Induction:   PONV Risk Score and Plan:   Airway Management Planned:   Additional Equipment:   Intra-op Plan:   Post-operative Plan:   Informed Consent:   Plan Discussed with:   Anesthesia Plan Comments: (Ekg tracing received from PCP is poor quality, appears to show sinus brady but is difficult to read. Will repeat DOS.)        Anesthesia Quick Evaluation

## 2018-04-16 ENCOUNTER — Encounter (HOSPITAL_COMMUNITY): Admission: RE | Payer: Self-pay | Source: Home / Self Care

## 2018-04-16 ENCOUNTER — Inpatient Hospital Stay (HOSPITAL_COMMUNITY): Admission: RE | Admit: 2018-04-16 | Payer: PPO | Source: Home / Self Care | Admitting: Orthopedic Surgery

## 2018-04-16 SURGERY — ARTHROPLASTY, SHOULDER, TOTAL, REVERSE
Anesthesia: General | Laterality: Right

## 2018-04-30 DIAGNOSIS — I509 Heart failure, unspecified: Secondary | ICD-10-CM | POA: Diagnosis not present

## 2018-04-30 DIAGNOSIS — R570 Cardiogenic shock: Secondary | ICD-10-CM | POA: Diagnosis not present

## 2018-04-30 DIAGNOSIS — I420 Dilated cardiomyopathy: Secondary | ICD-10-CM | POA: Diagnosis not present

## 2018-04-30 DIAGNOSIS — E538 Deficiency of other specified B group vitamins: Secondary | ICD-10-CM | POA: Diagnosis not present

## 2018-04-30 DIAGNOSIS — I472 Ventricular tachycardia: Secondary | ICD-10-CM | POA: Diagnosis not present

## 2018-04-30 DIAGNOSIS — R0600 Dyspnea, unspecified: Secondary | ICD-10-CM | POA: Diagnosis not present

## 2018-04-30 DIAGNOSIS — I5043 Acute on chronic combined systolic (congestive) and diastolic (congestive) heart failure: Secondary | ICD-10-CM | POA: Diagnosis not present

## 2018-04-30 DIAGNOSIS — I10 Essential (primary) hypertension: Secondary | ICD-10-CM | POA: Diagnosis not present

## 2018-04-30 DIAGNOSIS — K219 Gastro-esophageal reflux disease without esophagitis: Secondary | ICD-10-CM | POA: Diagnosis not present

## 2018-05-01 ENCOUNTER — Inpatient Hospital Stay (INDEPENDENT_AMBULATORY_CARE_PROVIDER_SITE_OTHER): Payer: PPO | Admitting: Orthopedic Surgery

## 2018-05-19 ENCOUNTER — Encounter: Payer: Self-pay | Admitting: Orthopedic Surgery

## 2018-06-18 DIAGNOSIS — M1990 Unspecified osteoarthritis, unspecified site: Secondary | ICD-10-CM | POA: Diagnosis not present

## 2018-06-18 DIAGNOSIS — G459 Transient cerebral ischemic attack, unspecified: Secondary | ICD-10-CM | POA: Diagnosis not present

## 2018-06-18 DIAGNOSIS — G629 Polyneuropathy, unspecified: Secondary | ICD-10-CM | POA: Diagnosis not present

## 2018-06-18 DIAGNOSIS — R001 Bradycardia, unspecified: Secondary | ICD-10-CM | POA: Diagnosis not present

## 2018-06-18 DIAGNOSIS — Z79899 Other long term (current) drug therapy: Secondary | ICD-10-CM | POA: Diagnosis not present

## 2018-06-18 DIAGNOSIS — E538 Deficiency of other specified B group vitamins: Secondary | ICD-10-CM | POA: Diagnosis not present

## 2018-06-20 ENCOUNTER — Other Ambulatory Visit: Payer: Self-pay

## 2018-06-26 DIAGNOSIS — Z8673 Personal history of transient ischemic attack (TIA), and cerebral infarction without residual deficits: Secondary | ICD-10-CM | POA: Diagnosis not present

## 2018-06-26 DIAGNOSIS — E538 Deficiency of other specified B group vitamins: Secondary | ICD-10-CM | POA: Diagnosis not present

## 2018-06-26 DIAGNOSIS — G9009 Other idiopathic peripheral autonomic neuropathy: Secondary | ICD-10-CM | POA: Diagnosis not present

## 2018-06-26 DIAGNOSIS — E785 Hyperlipidemia, unspecified: Secondary | ICD-10-CM | POA: Diagnosis not present

## 2018-06-28 NOTE — Progress Notes (Signed)
[id number] - [place], [place] - [address] AT [name] [place] [address] Phone: [phone] Fax: [phone]      Your procedure is scheduled on June 9th.  Report to [facility] Main Entrance "A" at 5:30 A.M., and check in at the Admitting office.  Call this number if you have problems the morning of surgery:  [phone]  Call [phone] if you have any questions prior to your surgery date Monday-Friday 8am-4pm    Remember:  Do not eat after midnight.  You may drink clear liquids until 4:30 AM .   Clear liquids allowed are: Water, Non-Citrus Juices (without pulp), Carbonated Beverages, Clear Tea, Black Coffee Only, and Gatorade    Take these medicines the morning of surgery with A SIP OF WATER   Tylenol - if needed  Simvastatin (Zocor) - if needed  7 days prior to surgery STOP taking any Aspirin (unless otherwise instructed by your surgeon), Aleve, Naproxen, Ibuprofen, Motrin, Advil, Goody's, BC's, all herbal medications, fish oil, and all vitamins.    The Morning of Surgery  Do not wear jewelry, make-up or nail polish.  Do not wear lotions, powders, or perfumes, or deodorant  Do not shave 48 hours prior to surgery.    Do not bring valuables to the hospital.  [facility] is not responsible for any belongings or valuables.  If you are a smoker, DO NOT Smoke 24 hours prior to surgery IF you wear a CPAP at night please bring your mask, tubing, and machine the morning of surgery   Remember that you must have someone to transport you home after your surgery, and remain with you for 24 hours if you are discharged the same day.   Contacts, glasses, hearing aids, dentures or bridgework may not be worn into surgery.    Leave your suitcase in the car.  After surgery it may be brought to your room.  For patients admitted to the hospital, discharge time will be determined by your treatment  team.  Patients discharged the day of surgery will not be allowed to drive home.    Special instructions:   [place]- Preparing For Surgery  Before surgery, you can play an important role. Because skin is not sterile, your skin needs to be as free of germs as possible. You can reduce the number of germs on your skin by washing with CHG (chlorahexidine gluconate) Soap before surgery.  CHG is an antiseptic cleaner which kills germs and bonds with the skin to continue killing germs even after washing.    Oral Hygiene is also important to reduce your risk of infection.  Remember - BRUSH YOUR TEETH THE MORNING OF SURGERY WITH YOUR REGULAR TOOTHPASTE  Please do not use if you have an allergy to CHG or antibacterial soaps. If your skin becomes reddened/irritated stop using the CHG.  Do not shave (including legs and underarms) for at least 48 hours prior to first CHG shower. It is OK to shave your face.  Please follow these instructions carefully.   1. Shower the NIGHT BEFORE SURGERY and the MORNING OF SURGERY with CHG Soap.   2. If you chose to wash your hair, wash your hair first as usual with your normal shampoo.  3. After you shampoo, rinse your hair and body thoroughly to remove the shampoo.  4. Use CHG as you would any other liquid soap. You can apply CHG directly  to the skin and wash gently with a scrungie or a clean washcloth.   5. Apply the CHG Soap to your body ONLY FROM THE NECK DOWN.  Do not use on open wounds or open sores. Avoid contact with your eyes, ears, mouth and genitals (private parts). Wash Face and genitals (private parts)  with your normal soap.   6. Wash thoroughly, paying special attention to the area where your surgery will be performed.  7. Thoroughly rinse your body with warm water from the neck down.  8. DO NOT shower/wash with your normal soap after using and rinsing off the CHG Soap.  9. Pat yourself dry with a CLEAN TOWEL.  10. Wear CLEAN PAJAMAS to bed  the night before surgery, wear comfortable clothes the morning of surgery  11. Place CLEAN SHEETS on your bed the night of your first shower and DO NOT SLEEP WITH PETS.    Day of Surgery:  Do not apply any deodorants/lotions.  Please wear clean clothes to the hospital/surgery center.   Remember to brush your teeth WITH YOUR REGULAR TOOTHPASTE.   Please read over the following fact sheets that you were given.

## 2018-07-01 ENCOUNTER — Encounter (HOSPITAL_COMMUNITY): Payer: Self-pay

## 2018-07-01 ENCOUNTER — Encounter (HOSPITAL_COMMUNITY)
Admission: RE | Admit: 2018-07-01 | Discharge: 2018-07-01 | Disposition: A | Discharge status: Home / Self Care | Payer: PPO | Source: Ambulatory Visit | Attending: Orthopedic Surgery | Admitting: Orthopedic Surgery

## 2018-07-01 ENCOUNTER — Other Ambulatory Visit (HOSPITAL_COMMUNITY)
Admission: RE | Admit: 2018-07-01 | Discharge: 2018-07-01 | Disposition: A | Discharge status: Home / Self Care | Payer: PPO | Source: Ambulatory Visit | Attending: Orthopedic Surgery | Admitting: Orthopedic Surgery

## 2018-07-01 ENCOUNTER — Other Ambulatory Visit: Payer: Self-pay

## 2018-07-01 DIAGNOSIS — G8918 Other acute postprocedural pain: Secondary | ICD-10-CM | POA: Diagnosis not present

## 2018-07-01 DIAGNOSIS — Z8673 Personal history of transient ischemic attack (TIA), and cerebral infarction without residual deficits: Secondary | ICD-10-CM | POA: Diagnosis not present

## 2018-07-01 DIAGNOSIS — Z96611 Presence of right artificial shoulder joint: Secondary | ICD-10-CM | POA: Diagnosis not present

## 2018-07-01 DIAGNOSIS — Z1159 Encounter for screening for other viral diseases: Secondary | ICD-10-CM | POA: Insufficient documentation

## 2018-07-01 DIAGNOSIS — M25511 Pain in right shoulder: Secondary | ICD-10-CM | POA: Diagnosis present

## 2018-07-01 DIAGNOSIS — Z01812 Encounter for preprocedural laboratory examination: Secondary | ICD-10-CM | POA: Insufficient documentation

## 2018-07-01 DIAGNOSIS — M19011 Primary osteoarthritis, right shoulder: Secondary | ICD-10-CM | POA: Diagnosis present

## 2018-07-01 DIAGNOSIS — Z90710 Acquired absence of both cervix and uterus: Secondary | ICD-10-CM | POA: Diagnosis not present

## 2018-07-01 DIAGNOSIS — E785 Hyperlipidemia, unspecified: Secondary | ICD-10-CM | POA: Diagnosis present

## 2018-07-01 DIAGNOSIS — M19019 Primary osteoarthritis, unspecified shoulder: Secondary | ICD-10-CM | POA: Diagnosis not present

## 2018-07-01 DIAGNOSIS — Z471 Aftercare following joint replacement surgery: Secondary | ICD-10-CM | POA: Diagnosis not present

## 2018-07-01 DIAGNOSIS — S83259A Bucket-handle tear of lateral meniscus, current injury, unspecified knee, initial encounter: Secondary | ICD-10-CM | POA: Diagnosis not present

## 2018-07-01 LAB — URINALYSIS, ROUTINE W REFLEX MICROSCOPIC
Bilirubin Urine: NEGATIVE
Glucose, UA: NEGATIVE mg/dL
Hgb urine dipstick: NEGATIVE
Ketones, ur: NEGATIVE mg/dL
Leukocytes,Ua: NEGATIVE
Nitrite: NEGATIVE
Protein, ur: NEGATIVE mg/dL
Specific Gravity, Urine: 1.012 (ref 1.005–1.030)
pH: 5 (ref 5.0–8.0)

## 2018-07-01 LAB — BASIC METABOLIC PANEL
Anion gap: 10 (ref 5–15)
BUN: 12 mg/dL (ref 8–23)
CO2: 22 mmol/L (ref 22–32)
Calcium: 9.5 mg/dL (ref 8.9–10.3)
Chloride: 107 mmol/L (ref 98–111)
Creatinine, Ser: 0.74 mg/dL (ref 0.44–1.00)
GFR calc Af Amer: >60 mL/min (ref >60)
GFR calc non Af Amer: >60 mL/min (ref >60)
Glucose, Bld: 111 mg/dL — ABNORMAL HIGH (ref 70–99)
Potassium: 4.7 mmol/L (ref 3.5–5.1)
Sodium: 139 mmol/L (ref 135–145)

## 2018-07-01 LAB — SARS CORONAVIRUS 2 BY RT PCR (HOSPITAL ORDER, PERFORMED IN ~~LOC~~ HOSPITAL LAB): SARS Coronavirus 2: NEGATIVE

## 2018-07-01 LAB — CBC
HCT: 43.4 % (ref 36.0–46.0)
Hemoglobin: 15.2 g/dL — ABNORMAL HIGH (ref 12.0–15.0)
MCH: 32 pg (ref 26.0–34.0)
MCHC: 35 g/dL (ref 30.0–36.0)
MCV: 91.4 fL (ref 80.0–100.0)
Platelets: 201 10*3/uL (ref 150–400)
RBC: 4.75 MIL/uL (ref 3.87–5.11)
RDW: 13 % (ref 11.5–15.5)
WBC: 6.6 10*3/uL (ref 4.0–10.5)
nRBC: 0 % (ref 0.0–0.2)

## 2018-07-01 LAB — SURGICAL PCR SCREEN
MRSA, PCR: NEGATIVE
Staphylococcus aureus: NEGATIVE

## 2018-07-01 NOTE — H&P (Signed)
[name] is an 78 y.o. female.   Chief Complaint: Right shoulder pain HPI: Adra is a 78 year old patient with right shoulder pain.  She has had this for several months.  She has limited function and pain.  She does have known rotator cuff arthropathy.  Left shoulder is reasonably functional.  She does have a daughter that lives with her at home.  She has tried and failed long duration of conservative treatment options.  Presents now for reverse shoulder replacement after continuing pain and functional limitation.  Past Medical History:  Diagnosis Date  . B12 deficiency    managed by Dr. Willey Blade: monthly injections  . Hematuria 05/11/2015  . Hemorrhoids   . Hyperlipemia   . Impaired fasting glucose   . Neuropathy    hereditary and idiopathic  . Osteoarthritis   . Pelvic pressure in female 05/11/2015  . PONV (postoperative nausea and vomiting)   . Transient cerebral ischemic attack    no impairment  . Vaginal atrophy 05/11/2015    Past Surgical History:  Procedure Laterality Date  . ABDOMINAL HYSTERECTOMY    . ANKLE FRACTURE SURGERY     left  . APPENDECTOMY    . CATARACT EXTRACTION    . COLONOSCOPY  09/30/2003   RMR: Normal colon. Subtle abnormalities in the ileocecal valve and terminal  ileum as described above of doubtful clinical significance/Normal rectum  . COLONOSCOPY N/A 11/03/2013   Procedure: COLONOSCOPY;  Surgeon: Daneil Dolin, MD;  Location: [facility];  Service: Endoscopy;  Laterality: N/A;  830 - moved to 10:45 - Ginger to notify pt  . right leg fracture surgery     right  . TUBAL LIGATION      Family History  Problem Relation Age of Onset  . Heart disease Daughter        transposition of great arteries/on fourth pacemaker  . Heart disease Mother   . Glaucoma Mother   . Arthritis Father        rheumatoid  . Alcohol abuse Father   . Diabetes Father   . Glaucoma Sister   . Arthritis Brother        rheumatoid  . Colon cancer Neg Hx   . Colon polyps  Neg Hx   . Breast cancer Neg Hx   . Lung cancer Neg Hx    Social History:  reports that she has never smoked. She has never used smokeless tobacco. She reports that she does not drink alcohol or use drugs.  Allergies:  Allergies  Allergen Reactions  . Sulfa Antibiotics Nausea And Vomiting    No medications prior to admission.    Results for orders placed or performed during the hospital encounter of 07/01/18 (from the past 48 hour(s))  SARS Coronavirus 2 (CEPHEID - Performed in [facility] hospital lab), Hosp Order     Status: None   Collection Time: 07/01/18 10:29 AM  Result Value Ref Range   SARS Coronavirus 2 NEGATIVE NEGATIVE    Comment: (NOTE) If result is NEGATIVE SARS-CoV-2 target nucleic acids are NOT DETECTED. The SARS-CoV-2 RNA is generally detectable in upper and lower  respiratory specimens during the acute phase of infection. The lowest  concentration of SARS-CoV-2 viral copies this assay can detect is 250  copies / mL. A negative result does not preclude SARS-CoV-2 infection  and should not be used as the sole basis for treatment or other  patient management decisions.  A negative result may occur with  improper specimen collection /  handling, submission of specimen other  than nasopharyngeal swab, presence of viral mutation(s) within the  areas targeted by this assay, and inadequate number of viral copies  (<250 copies / mL). A negative result must be combined with clinical  observations, patient history, and epidemiological information. If result is POSITIVE SARS-CoV-2 target nucleic acids are DETECTED. The SARS-CoV-2 RNA is generally detectable in upper and lower  respiratory specimens dur ing the acute phase of infection.  Positive  results are indicative of active infection with SARS-CoV-2.  Clinical  correlation with patient history and other diagnostic information is  necessary to determine patient infection status.  Positive results do  not rule out  bacterial infection or co-infection with other viruses. If result is PRESUMPTIVE POSTIVE SARS-CoV-2 nucleic acids MAY BE PRESENT.   A presumptive positive result was obtained on the submitted specimen  and confirmed on repeat testing.  While 2019 novel coronavirus  (SARS-CoV-2) nucleic acids may be present in the submitted sample  additional confirmatory testing may be necessary for epidemiological  and / or clinical management purposes  to differentiate between  SARS-CoV-2 and other Sarbecovirus currently known to infect humans.  If clinically indicated additional testing with an alternate test  methodology [phone]) is advised. The SARS-CoV-2 RNA is generally  detectable in upper and lower respiratory sp ecimens during the acute  phase of infection. The expected result is Negative. Fact Sheet for Patients:  BoilerBrush.com.cyhttps://www.fda.gov/media/136312/download Fact Sheet for Healthcare Providers: https://pope.com/https://www.fda.gov/media/136313/download This test is not yet approved or cleared by the [place] FDA and has been authorized for detection and/or diagnosis of SARS-CoV-2 by FDA under an Emergency Use Authorization (EUA).  This EUA will remain in effect (meaning this test can be used) for the duration of the COVID-19 declaration under Section 564(b)(1) of the Act, 21 U.S.C. section 360bbb-3(b)(1), unless the authorization is terminated or revoked sooner. Performed at [facility], [facility], [address] [place], [place] [address]    No results found.  Review of Systems  Musculoskeletal: Positive for joint pain.  All other systems reviewed and are negative.   There were no vitals taken for this visit. Physical Exam  Constitutional: She appears well-developed.  HENT:  Head: Normocephalic.  Eyes: Pupils are equal, round, and reactive to light.  Neck: Normal range of motion.  Cardiovascular: Normal rate.  Respiratory: Effort normal.  Neurological: She is alert.  Skin: Skin  is warm.  Psychiatric: She has a normal mood and affect.  Examination the right shoulder demonstrates about 50 degrees of forward flexion abduction.  Deltoid is functional.  Rotator cuff weakness is present infraspinatus and supraspinatus testing.  Motor sensory function to the hand is intact  Assessment/Plan Impression is rotator cuff arthropathy symptomatic and somewhat debilitating.  Is gotten worse since November.  Plan is reverse shoulder replacement using Biomet components with preoperative 3D printing for optimal implant placement.  Patient understands the risk and benefits including but not limited to infection nerve vessel damage dislocation as well as continued mild pain and functional disability.  All questions answered  Burnard BuntingG Scott Ashlynn Gunnels, MD 07/01/2018, 5:23 PM

## 2018-07-01 NOTE — Anesthesia Preprocedure Evaluation (Addendum)
Anesthesia Evaluation  Patient identified by MRN, date of birth, ID band Patient awake    Reviewed: Allergy & Precautions, H&P , NPO status , Patient's Chart, lab work & pertinent test results  History of Anesthesia Complications (+) PONV  Airway Mallampati: II  TM Distance: >3 FB Neck ROM: Full    Dental no notable dental hx. (+) Teeth Intact, Dental Advisory Given   Pulmonary neg pulmonary ROS,    Pulmonary exam normal breath sounds clear to auscultation       Cardiovascular Exercise Tolerance: Good negative cardio ROS   Rhythm:Regular Rate:Normal     Neuro/Psych negative neurological ROS  negative psych ROS   GI/Hepatic negative GI ROS, Neg liver ROS,   Endo/Other  negative endocrine ROS  Renal/GU negative Renal ROS  negative genitourinary   Musculoskeletal  (+) Arthritis , Osteoarthritis,    Abdominal   Peds  Hematology negative hematology ROS (+)   Anesthesia Other Findings   Reproductive/Obstetrics negative OB ROS                            Anesthesia Physical Anesthesia Plan  ASA: II  Anesthesia Plan: General   Post-op Pain Management:  Regional for Post-op pain   Induction: Intravenous  PONV Risk Score and Plan: 4 or greater and Ondansetron, Dexamethasone and Midazolam  Airway Management Planned: Oral ETT  Additional Equipment:   Intra-op Plan:   Post-operative Plan: Extubation in OR  Informed Consent: I have reviewed the patients History and Physical, chart, labs and discussed the procedure including the risks, benefits and alternatives for the proposed anesthesia with the patient or authorized representative who has indicated his/her understanding and acceptance.     Dental advisory given  Plan Discussed with: CRNA  Anesthesia Plan Comments:         Anesthesia Quick Evaluation

## 2018-07-01 NOTE — Progress Notes (Signed)
PCP - Asencion Noble  Chest x-ray - N/A EKG - N/A  DM - denies SA - denies  Aspirin Instructions: follow your surgeon's instructions on when to stop ASA  Anesthesia review: N/A  Patient denies shortness of breath, fever, cough and chest pain at PAT appointment   Patient verbalized understanding of instructions that were given to them at the PAT appointment. Patient was also instructed that they will need to review over the PAT instructions again at home before surgery.

## 2018-07-02 ENCOUNTER — Inpatient Hospital Stay (HOSPITAL_COMMUNITY)
Admission: RE | Admit: 2018-07-02 | Discharge: 2018-07-03 | DRG: 483 | Disposition: A | Discharge status: Home / Self Care | Payer: PPO | Attending: Orthopedic Surgery | Admitting: Orthopedic Surgery

## 2018-07-02 ENCOUNTER — Inpatient Hospital Stay (HOSPITAL_COMMUNITY): Payer: PPO | Admitting: Anesthesiology

## 2018-07-02 ENCOUNTER — Encounter (HOSPITAL_COMMUNITY): Payer: Self-pay

## 2018-07-02 ENCOUNTER — Inpatient Hospital Stay (HOSPITAL_COMMUNITY): Payer: PPO

## 2018-07-02 ENCOUNTER — Encounter (HOSPITAL_COMMUNITY)
Admission: RE | Disposition: A | Discharge status: Home / Self Care | Payer: Self-pay | Source: Home / Self Care | Attending: Orthopedic Surgery

## 2018-07-02 ENCOUNTER — Other Ambulatory Visit: Payer: Self-pay

## 2018-07-02 DIAGNOSIS — Z8673 Personal history of transient ischemic attack (TIA), and cerebral infarction without residual deficits: Secondary | ICD-10-CM | POA: Diagnosis not present

## 2018-07-02 DIAGNOSIS — E785 Hyperlipidemia, unspecified: Secondary | ICD-10-CM | POA: Diagnosis present

## 2018-07-02 DIAGNOSIS — Z1159 Encounter for screening for other viral diseases: Secondary | ICD-10-CM

## 2018-07-02 DIAGNOSIS — M19019 Primary osteoarthritis, unspecified shoulder: Secondary | ICD-10-CM | POA: Diagnosis not present

## 2018-07-02 DIAGNOSIS — M25511 Pain in right shoulder: Secondary | ICD-10-CM | POA: Diagnosis present

## 2018-07-02 DIAGNOSIS — M19011 Primary osteoarthritis, right shoulder: Principal | ICD-10-CM | POA: Diagnosis present

## 2018-07-02 DIAGNOSIS — Z90710 Acquired absence of both cervix and uterus: Secondary | ICD-10-CM | POA: Diagnosis not present

## 2018-07-02 HISTORY — PX: REVERSE SHOULDER ARTHROPLASTY: SHX5054

## 2018-07-02 LAB — URINE CULTURE

## 2018-07-02 SURGERY — ARTHROPLASTY, SHOULDER, TOTAL, REVERSE
Anesthesia: General | Site: Shoulder | Laterality: Right

## 2018-07-02 MED ORDER — CHLORHEXIDINE GLUCONATE 4 % EX LIQD: 60.0000 mL | Freq: Once | CUTANEOUS | Status: DC

## 2018-07-02 MED ORDER — VANCOMYCIN HCL 500 MG IV SOLR
INTRAVENOUS | Status: AC
  Filled 2018-07-02: qty 500

## 2018-07-02 MED ORDER — MENTHOL 3 MG MT LOZG: 1.0000 | LOZENGE | OROMUCOSAL | Status: DC | PRN

## 2018-07-02 MED ORDER — METHOCARBAMOL 500 MG PO TABS
500.0000 mg | ORAL_TABLET | Freq: Four times a day (QID) | ORAL | Status: DC | PRN
  Administered 2018-07-02: 500 mg via ORAL
  Filled 2018-07-02: qty 1

## 2018-07-02 MED ORDER — MORPHINE SULFATE (PF) 4 MG/ML IV SOLN
INTRAVENOUS | Status: AC
  Filled 2018-07-02: qty 2

## 2018-07-02 MED ORDER — DEXAMETHASONE SODIUM PHOSPHATE 10 MG/ML IJ SOLN
INTRAMUSCULAR | Status: AC
  Filled 2018-07-02: qty 1

## 2018-07-02 MED ORDER — PHENYLEPHRINE HCL (PRESSORS) 10 MG/ML IV SOLN
INTRAVENOUS | Status: DC | PRN
  Administered 2018-07-02 (×2): 120 ug via INTRAVENOUS
  Administered 2018-07-02: 80 ug via INTRAVENOUS

## 2018-07-02 MED ORDER — HYDROMORPHONE HCL 1 MG/ML IJ SOLN: 0.2500 mg | INTRAMUSCULAR | Status: DC | PRN

## 2018-07-02 MED ORDER — LACTATED RINGERS IV SOLN
INTRAVENOUS | Status: DC | PRN
  Administered 2018-07-02 (×2): via INTRAVENOUS

## 2018-07-02 MED ORDER — SIMVASTATIN 20 MG PO TABS
10.0000 mg | ORAL_TABLET | Freq: Every day | ORAL | Status: DC
  Administered 2018-07-02: 10 mg via ORAL
  Filled 2018-07-02: qty 1

## 2018-07-02 MED ORDER — PREGABALIN 25 MG PO CAPS
75.0000 mg | ORAL_CAPSULE | Freq: Two times a day (BID) | ORAL | Status: DC
  Administered 2018-07-02 – 2018-07-03 (×2): 75 mg via ORAL
  Filled 2018-07-02 (×2): qty 3

## 2018-07-02 MED ORDER — DEXAMETHASONE SODIUM PHOSPHATE 10 MG/ML IJ SOLN
INTRAMUSCULAR | Status: DC | PRN
  Administered 2018-07-02: 5 mg via INTRAVENOUS

## 2018-07-02 MED ORDER — PROPOFOL 10 MG/ML IV BOLUS
INTRAVENOUS | Status: DC | PRN
  Administered 2018-07-02: 100 mg via INTRAVENOUS

## 2018-07-02 MED ORDER — SUCCINYLCHOLINE CHLORIDE 200 MG/10ML IV SOSY
PREFILLED_SYRINGE | INTRAVENOUS | Status: DC | PRN
  Administered 2018-07-02: 100 mg via INTRAVENOUS

## 2018-07-02 MED ORDER — FENTANYL CITRATE (PF) 250 MCG/5ML IJ SOLN
INTRAMUSCULAR | Status: AC
  Filled 2018-07-02: qty 5

## 2018-07-02 MED ORDER — ASPIRIN 81 MG PO CHEW
81.0000 mg | CHEWABLE_TABLET | Freq: Two times a day (BID) | ORAL | Status: DC
  Administered 2018-07-02 – 2018-07-03 (×2): 81 mg via ORAL
  Filled 2018-07-02 (×2): qty 1

## 2018-07-02 MED ORDER — ONDANSETRON HCL 4 MG/2ML IJ SOLN
INTRAMUSCULAR | Status: AC
  Filled 2018-07-02: qty 2

## 2018-07-02 MED ORDER — LACTATED RINGERS IV SOLN: INTRAVENOUS | Status: AC

## 2018-07-02 MED ORDER — PROPOFOL 10 MG/ML IV BOLUS
INTRAVENOUS | Status: AC
  Filled 2018-07-02: qty 20

## 2018-07-02 MED ORDER — PHENYLEPHRINE 40 MCG/ML (10ML) SYRINGE FOR IV PUSH (FOR BLOOD PRESSURE SUPPORT)
PREFILLED_SYRINGE | INTRAVENOUS | Status: AC
  Filled 2018-07-02: qty 10

## 2018-07-02 MED ORDER — ONDANSETRON HCL 4 MG/2ML IJ SOLN: 4.0000 mg | Freq: Four times a day (QID) | INTRAMUSCULAR | Status: DC | PRN

## 2018-07-02 MED ORDER — FENTANYL CITRATE (PF) 250 MCG/5ML IJ SOLN
INTRAMUSCULAR | Status: DC | PRN
  Administered 2018-07-02: 25 ug via INTRAVENOUS
  Administered 2018-07-02 (×2): 50 ug via INTRAVENOUS
  Administered 2018-07-02: 25 ug via INTRAVENOUS

## 2018-07-02 MED ORDER — EPHEDRINE 5 MG/ML INJ
INTRAVENOUS | Status: AC
  Filled 2018-07-02: qty 10

## 2018-07-02 MED ORDER — HYDROCODONE-ACETAMINOPHEN 5-325 MG PO TABS
1.0000 | ORAL_TABLET | ORAL | Status: DC | PRN
  Administered 2018-07-03: 1 via ORAL
  Filled 2018-07-02: qty 1

## 2018-07-02 MED ORDER — CEFAZOLIN SODIUM-DEXTROSE 1-4 GM/50ML-% IV SOLN
1.0000 g | Freq: Four times a day (QID) | INTRAVENOUS | Status: AC
  Administered 2018-07-02 – 2018-07-03 (×2): 1 g via INTRAVENOUS
  Filled 2018-07-02 (×4): qty 50

## 2018-07-02 MED ORDER — MORPHINE SULFATE (PF) 2 MG/ML IV SOLN: 0.5000 mg | INTRAVENOUS | Status: DC | PRN

## 2018-07-02 MED ORDER — ONDANSETRON HCL 4 MG PO TABS: 4.0000 mg | ORAL_TABLET | Freq: Four times a day (QID) | ORAL | Status: DC | PRN

## 2018-07-02 MED ORDER — ACETAMINOPHEN 500 MG PO TABS
500.0000 mg | ORAL_TABLET | Freq: Four times a day (QID) | ORAL | Status: AC
  Administered 2018-07-02 – 2018-07-03 (×3): 500 mg via ORAL
  Filled 2018-07-02 (×3): qty 1

## 2018-07-02 MED ORDER — SUCCINYLCHOLINE CHLORIDE 200 MG/10ML IV SOSY
PREFILLED_SYRINGE | INTRAVENOUS | Status: AC
  Filled 2018-07-02: qty 10

## 2018-07-02 MED ORDER — 0.9 % SODIUM CHLORIDE (POUR BTL) OPTIME
TOPICAL | Status: DC | PRN
  Administered 2018-07-02 (×4): 1000 mL

## 2018-07-02 MED ORDER — CEFAZOLIN SODIUM-DEXTROSE 2-4 GM/100ML-% IV SOLN
2.0000 g | INTRAVENOUS | Status: AC
  Administered 2018-07-02: 2 g via INTRAVENOUS
  Filled 2018-07-02: qty 100

## 2018-07-02 MED ORDER — PHENOL 1.4 % MT LIQD: 1.0000 | OROMUCOSAL | Status: DC | PRN

## 2018-07-02 MED ORDER — BUPIVACAINE-EPINEPHRINE (PF) 0.5% -1:200000 IJ SOLN
INTRAMUSCULAR | Status: DC | PRN
  Administered 2018-07-02: 15 mL via PERINEURAL

## 2018-07-02 MED ORDER — CLONIDINE HCL (ANALGESIA) 100 MCG/ML EP SOLN
EPIDURAL | Status: AC
  Filled 2018-07-02: qty 10

## 2018-07-02 MED ORDER — ONDANSETRON HCL 4 MG/2ML IJ SOLN
INTRAMUSCULAR | Status: DC | PRN
  Administered 2018-07-02: 4 mg via INTRAVENOUS

## 2018-07-02 MED ORDER — METHOCARBAMOL 1000 MG/10ML IJ SOLN
500.0000 mg | Freq: Four times a day (QID) | INTRAVENOUS | Status: DC | PRN
  Filled 2018-07-02: qty 5

## 2018-07-02 MED ORDER — EPHEDRINE SULFATE 50 MG/ML IJ SOLN
INTRAMUSCULAR | Status: DC | PRN
  Administered 2018-07-02: 5 mg via INTRAVENOUS
  Administered 2018-07-02: 10 mg via INTRAVENOUS
  Administered 2018-07-02 (×3): 5 mg via INTRAVENOUS

## 2018-07-02 MED ORDER — TRANEXAMIC ACID-NACL 1000-0.7 MG/100ML-% IV SOLN
INTRAVENOUS | Status: DC | PRN
  Administered 2018-07-02: 1000 mg via INTRAVENOUS

## 2018-07-02 MED ORDER — GLYCOPYRROLATE PF 0.2 MG/ML IJ SOSY
PREFILLED_SYRINGE | INTRAMUSCULAR | Status: DC | PRN
  Administered 2018-07-02: .2 mg via INTRAVENOUS

## 2018-07-02 MED ORDER — SODIUM CHLORIDE 0.9 % IV SOLN
INTRAVENOUS | Status: DC | PRN
  Administered 2018-07-02: 08:00:00 25 ug/min via INTRAVENOUS

## 2018-07-02 MED ORDER — BUPIVACAINE HCL (PF) 0.5 % IJ SOLN
INTRAMUSCULAR | Status: AC
  Filled 2018-07-02: qty 30

## 2018-07-02 MED ORDER — DOCUSATE SODIUM 100 MG PO CAPS
100.0000 mg | ORAL_CAPSULE | Freq: Two times a day (BID) | ORAL | Status: DC
  Administered 2018-07-02 – 2018-07-03 (×2): 100 mg via ORAL
  Filled 2018-07-02 (×2): qty 1

## 2018-07-02 MED ORDER — METOCLOPRAMIDE HCL 5 MG PO TABS: 5.0000 mg | ORAL_TABLET | Freq: Three times a day (TID) | ORAL | Status: DC | PRN

## 2018-07-02 MED ORDER — VANCOMYCIN HCL 500 MG IV SOLR
INTRAVENOUS | Status: DC | PRN
  Administered 2018-07-02: 500 mg

## 2018-07-02 MED ORDER — TRANEXAMIC ACID-NACL 1000-0.7 MG/100ML-% IV SOLN
INTRAVENOUS | Status: AC
  Filled 2018-07-02: qty 100

## 2018-07-02 MED ORDER — BUPIVACAINE LIPOSOME 1.3 % IJ SUSP
INTRAMUSCULAR | Status: DC | PRN
  Administered 2018-07-02: 10 mL via PERINEURAL

## 2018-07-02 MED ORDER — METOCLOPRAMIDE HCL 5 MG/ML IJ SOLN: 5.0000 mg | Freq: Three times a day (TID) | INTRAMUSCULAR | Status: DC | PRN

## 2018-07-02 MED ORDER — ACETAMINOPHEN 500 MG PO TABS
1000.0000 mg | ORAL_TABLET | Freq: Once | ORAL | Status: AC
  Administered 2018-07-02: 1000 mg via ORAL
  Filled 2018-07-02: qty 2

## 2018-07-02 SURGICAL SUPPLY — 87 items
ALCOHOL 70% 16 OZ (MISCELLANEOUS) ×3 IMPLANT
AUG COMP REV MI TAPER ADAPTER (Joint) ×3 IMPLANT
AUGMENT COMP REV MI TAPR ADPTR (Joint) ×1 IMPLANT
BEARING HUMERAL SHLDER 36M STD (Shoulder) ×1 IMPLANT
BIT DRILL 2.7 W/STOP DISP (BIT) ×2 IMPLANT
BIT DRILL 2.7MM W/STOP DISP (BIT) ×1
BIT DRILL F/CENTRAL SCRW 3.2 (BIT) ×1
BIT DRILL F/CENTRAL SCRW 3.2MM (BIT) ×1 IMPLANT
BIT DRILL TWIST 2.7 (BIT) ×2 IMPLANT
BIT DRILL TWIST 2.7MM (BIT) ×1
BLADE SAW SGTL 13X75X1.27 (BLADE) ×3 IMPLANT
BRNG HUM STD 36 RVRS SHLDR (Shoulder) ×1 IMPLANT
CHLORAPREP W/TINT 26ML (MISCELLANEOUS) ×3 IMPLANT
CLOSURE STERI-STRIP 1/2X4 (GAUZE/BANDAGES/DRESSINGS) ×2
CLOSURE WOUND 1/2 X4 (GAUZE/BANDAGES/DRESSINGS) ×1
CLSR STERI-STRIP ANTIMIC 1/2X4 (GAUZE/BANDAGES/DRESSINGS) ×4 IMPLANT
COVER SURGICAL LIGHT HANDLE (MISCELLANEOUS) ×3 IMPLANT
COVER WAND RF STERILE (DRAPES) ×3 IMPLANT
DRAPE INCISE IOBAN 66X45 STRL (DRAPES) ×3 IMPLANT
DRAPE U-SHAPE 47X51 STRL (DRAPES) ×6 IMPLANT
DRILL BIT F/CENTRAL SCRW 3.2MM (BIT) ×3
DRSG AQUACEL AG ADV 3.5X 4 (GAUZE/BANDAGES/DRESSINGS) ×3 IMPLANT
DRSG AQUACEL AG ADV 3.5X10 (GAUZE/BANDAGES/DRESSINGS) ×3 IMPLANT
ELECT BLADE 4.0 EZ CLEAN MEGAD (MISCELLANEOUS) ×6
ELECT REM PT RETURN 9FT ADLT (ELECTROSURGICAL) ×3
ELECTRODE BLDE 4.0 EZ CLN MEGD (MISCELLANEOUS) ×2 IMPLANT
ELECTRODE REM PT RTRN 9FT ADLT (ELECTROSURGICAL) ×1 IMPLANT
GAUZE SPONGE 4X4 12PLY STRL LF (GAUZE/BANDAGES/DRESSINGS) ×6 IMPLANT
GLENOID SPHERE STD STRL 36MM (Orthopedic Implant) ×3 IMPLANT
GLOVE BIOGEL PI IND STRL 7.5 (GLOVE) ×1 IMPLANT
GLOVE BIOGEL PI IND STRL 8 (GLOVE) ×1 IMPLANT
GLOVE BIOGEL PI INDICATOR 7.5 (GLOVE) ×2
GLOVE BIOGEL PI INDICATOR 8 (GLOVE) ×2
GLOVE ECLIPSE 7.0 STRL STRAW (GLOVE) ×3 IMPLANT
GLOVE SURG ORTHO 8.0 STRL STRW (GLOVE) ×3 IMPLANT
GOWN STRL REUS W/ TWL LRG LVL3 (GOWN DISPOSABLE) ×2 IMPLANT
GOWN STRL REUS W/ TWL XL LVL3 (GOWN DISPOSABLE) IMPLANT
GOWN STRL REUS W/TWL LRG LVL3 (GOWN DISPOSABLE) ×4
GOWN STRL REUS W/TWL XL LVL3 (GOWN DISPOSABLE)
GUIDE MODEL REV SHLD RT (ORTHOPEDIC DISPOSABLE SUPPLIES) ×3 IMPLANT
HYDROGEN PEROXIDE 16OZ (MISCELLANEOUS) ×3 IMPLANT
JET LAVAGE IRRISEPT WOUND (IRRIGATION / IRRIGATOR) ×3
KIT BASIN OR (CUSTOM PROCEDURE TRAY) ×3 IMPLANT
KIT TURNOVER KIT B (KITS) ×3 IMPLANT
LAVAGE JET IRRISEPT WOUND (IRRIGATION / IRRIGATOR) ×1 IMPLANT
LOOP VESSEL MAXI BLUE (MISCELLANEOUS) ×3 IMPLANT
MANIFOLD NEPTUNE II (INSTRUMENTS) ×3 IMPLANT
NDL SUT 6 .5 CRC .975X.05 MAYO (NEEDLE) ×1 IMPLANT
NEEDLE MAYO TAPER (NEEDLE) ×2
NS IRRIG 1000ML POUR BTL (IV SOLUTION) ×3 IMPLANT
PACK SHOULDER (CUSTOM PROCEDURE TRAY) ×3 IMPLANT
PAD ARMBOARD 7.5X6 YLW CONV (MISCELLANEOUS) ×6 IMPLANT
PIN STEINMANN THREADED TIP (PIN) ×3 IMPLANT
PIN THREADED REVERSE (PIN) ×3 IMPLANT
REAMER GUIDE BUSHING SURG DISP (MISCELLANEOUS) ×3 IMPLANT
REAMER GUIDE W/SCREW AUG (MISCELLANEOUS) ×3 IMPLANT
RETRIEVER SUT HEWSON (MISCELLANEOUS) ×3 IMPLANT
SCREW BONE STRL 6.5MMX25MM (Screw) ×3 IMPLANT
SCREW LOCKING 4.75MMX15MM (Screw) ×6 IMPLANT
SCREW LOCKING NS 4.75MMX20MM (Screw) ×6 IMPLANT
SHOULDER HUMERAL BEAR 36M STD (Shoulder) ×3 IMPLANT
SLING ARM IMMOBILIZER LRG (SOFTGOODS) ×3 IMPLANT
SOLUTION BETADINE 4OZ (MISCELLANEOUS) ×3 IMPLANT
SPONGE LAP 18X18 RF (DISPOSABLE) ×3 IMPLANT
SPONGE LAP 18X18 X RAY DECT (DISPOSABLE) ×3 IMPLANT
STEM SHOULDER (Stem) ×3 IMPLANT
STRIP CLOSURE SKIN 1/2X4 (GAUZE/BANDAGES/DRESSINGS) ×2 IMPLANT
SUCTION FRAZIER HANDLE 10FR (MISCELLANEOUS) ×2
SUCTION TUBE FRAZIER 10FR DISP (MISCELLANEOUS) ×1 IMPLANT
SUT BROADBAND TAPE 2PK 1.5 (SUTURE) ×6 IMPLANT
SUT FIBERWIRE #2 38 T-5 BLUE (SUTURE)
SUT MAXBRAID (SUTURE) IMPLANT
SUT MNCRL AB 3-0 PS2 18 (SUTURE) ×3 IMPLANT
SUT SILK 2 0 TIES 10X30 (SUTURE) ×3 IMPLANT
SUT VIC AB 0 CT1 27 (SUTURE) ×4
SUT VIC AB 0 CT1 27XBRD ANBCTR (SUTURE) ×2 IMPLANT
SUT VIC AB 1 CT1 27 (SUTURE) ×6
SUT VIC AB 1 CT1 27XBRD ANBCTR (SUTURE) ×2 IMPLANT
SUT VIC AB 2-0 CT1 27 (SUTURE) ×4
SUT VIC AB 2-0 CT1 TAPERPNT 27 (SUTURE) ×2 IMPLANT
SUT VICRYL 0 AB UR-6 (SUTURE) ×9 IMPLANT
SUT VICRYL 0 UR6 27IN ABS (SUTURE) ×9 IMPLANT
SUTURE FIBERWR #2 38 T-5 BLUE (SUTURE) IMPLANT
TOWEL OR 17X26 10 PK STRL BLUE (TOWEL DISPOSABLE) ×3 IMPLANT
TRAY FOLEY BAG SILVER LF 16FR (CATHETERS) IMPLANT
TRAY HUM REV SHOULDER STD +6 (Shoulder) ×3 IMPLANT
WATER STERILE IRR 1000ML POUR (IV SOLUTION) ×3 IMPLANT

## 2018-07-02 NOTE — Anesthesia Procedure Notes (Signed)
Procedure Name: Intubation Date/Time: 07/02/2018 7:42 AM Performed by: Kathryne Hitch, CRNA Pre-anesthesia Checklist: Patient identified, Emergency Drugs available, Suction available and Patient being monitored Patient Re-evaluated:Patient Re-evaluated prior to induction Oxygen Delivery Method: Circle system utilized Preoxygenation: Pre-oxygenation with 100% oxygen Induction Type: IV induction Laryngoscope Size: Miller and 2 Grade View: Grade I Tube type: Oral Tube size: 7.0 mm Number of attempts: 1 Airway Equipment and Method: Stylet and Oral airway Placement Confirmation: ETT inserted through vocal cords under direct vision,  positive ETCO2 and breath sounds checked- equal and bilateral Secured at: 22 cm Tube secured with: Tape Dental Injury: Teeth and Oropharynx as per pre-operative assessment

## 2018-07-02 NOTE — Transfer of Care (Signed)
Immediate Anesthesia Transfer of Care Note  Patient: [name]  Procedure(s) Performed: RIGHT REVERSE SHOULDER ARTHROPLASTY (Right Shoulder)  Patient Location: PACU  Anesthesia Type:General  Level of Consciousness: awake, alert , oriented and patient cooperative  Airway & Oxygen Therapy: Patient Spontanous Breathing and Patient connected to face mask oxygen  Post-op Assessment: Report given to RN and Post -op Vital signs reviewed and stable  Post vital signs: Reviewed and stable  Last Vitals:  Vitals Value Taken Time  BP    Temp    Pulse 88 07/02/2018 10:41 AM  Resp 20 07/02/2018 10:41 AM  SpO2 100 % 07/02/2018 10:41 AM  Vitals shown include unvalidated device data.  Last Pain:  Vitals:   07/02/18 0609  TempSrc:   PainSc: 8       Patients Stated Pain Goal: 2 (81/01/75 1025)  Complications: No apparent anesthesia complications

## 2018-07-02 NOTE — Interval H&P Note (Signed)
History and Physical Interval Note:  07/02/2018 7:00 AM  [name]  has presented today for surgery, with the diagnosis of right shoulder osteoarthritis.  The various methods of treatment have been discussed with the patient and family. After consideration of risks, benefits and other options for treatment, the patient has consented to  Procedure(s): RIGHT REVERSE SHOULDER ARTHROPLASTY (Right) as a surgical intervention.  The patient's history has been reviewed, patient examined, no change in status, stable for surgery.  I have reviewed the patient's chart and labs.  Questions were answered to the patient's satisfaction.     Anderson Malta

## 2018-07-02 NOTE — Anesthesia Postprocedure Evaluation (Signed)
Anesthesia Post Note  Patient: [name]  Procedure(s) Performed: RIGHT REVERSE SHOULDER ARTHROPLASTY (Right Shoulder)     Patient location during evaluation: PACU Anesthesia Type: General and Regional Level of consciousness: awake and alert Pain management: pain level controlled Vital Signs Assessment: post-procedure vital signs reviewed and stable Respiratory status: spontaneous breathing, nonlabored ventilation and respiratory function stable Cardiovascular status: blood pressure returned to baseline and stable Postop Assessment: no apparent nausea or vomiting Anesthetic complications: no    Last Vitals:  Vitals:   07/02/18 1210 07/02/18 1240  BP: (!) 107/58 99/62  Pulse: 63 62  Resp: 13 13  Temp:    SpO2: 98% 94%    Last Pain:  Vitals:   07/02/18 1240  TempSrc:   PainSc: 0-No pain                 Lashia Niese,W. EDMOND

## 2018-07-02 NOTE — Op Note (Signed)
NAME: [name], Morgan P. MEDICAL RECORD [id number] ACCOUNT [id number] DATE OF BIRTH:1940-12-10 FACILITY: [facility] LOCATION: [facility] PHYSICIAN:GREGORY Diamantina ProvidenceS. DEAN, MD  OPERATIVE REPORT  DATE OF PROCEDURE:  07/02/2018  PREOPERATIVE DIAGNOSIS:  Right shoulder rotator cuff arthropathy.  POSTOPERATIVE DIAGNOSIS:  Right shoulder rotator cuff arthropathy.  PROCEDURE:  Right shoulder reverse shoulder replacement using Biomet reverse shoulder replacement system including mini humeral stem size 10 with mini humeral tray standard thickness +6 taper offset 40 mm in diameter with a highly cross-linked  polyethylene bearing 36 mm in diameter with small augmented baseplate with 1 central screw and 4 peripheral locking screws with a 36 standard glenosphere.  SURGEON:  Cammy CopaGregory Scott Dean, MD  ASSISTANT:  April Green, RNFA  INDICATIONS:  The patient is a 78 year old patient with end-stage right shoulder rotator cuff arthropathy who presents for operative management after explanation of risks and benefits.   PROCEDURE IN DETAIL:  The patient was brought to the operating room where general endotracheal anesthesia was induced.  Preoperative antibiotics were administered.  Timeout was called.  Right shoulder was pre-scrubbed with benzoyl peroxide, alcohol and  then Betadine and allowed to air dry, prepped with DuraPrep solution, and draped in a sterile manner.  Collier Flowersoban was used to cover the entire operative field.  The patient was placed in the beach-chair position with the head in neutral position.  SCDs were  applied.  At this time after timeout was called, anterior approach to the shoulder was made.  A deltopectoral interval was identified.  Cephalic vein mobilized medially. We elevated some of the deltoid distally.  The biceps tendon was already tenodesed  within the groove.  Subacromial space was mobilized.  At this time, the circumflex vessels were ligated.  The axillary nerve was identified and then a vessel loop  was placed around it.  It was protected at all times during the case.  At this time also  the subscapularis was detached and the capsule was detached around the base of the humerus to about the 5 o'clock position.  This allowed the head to dislocate.  Rotator interval was released all the way to the base of the glenoid.  At this time, the  humeral head was broached.  The humeral head was then cut in 30 degrees of retroversion.  The broaching was then performed up to a size 8 and a cap was placed.  Attention was then directed towards the glenoid.  The capsule was excised circumferentially,  and a Bankart lesion was created from the 3 o'clock down to the 7 o'clock position.  Anterior retractor and posterior retractors were placed.  In accordance with preoperative templating, the guide was placed on the glenoid face.  This gave good  localization of the optimal point of baseplate placement.  Reaming was performed.  Superior reaming for the augment was also performed.  The baseplate was then placed with very good coverage obtained.  Very good fixation was achieved with a central  screw, and 4 peripheral locking screws were placed.  A 36 standard glenosphere was then placed as well.  Attention was then redirected towards the humerus.  Broaching performed up to a size 10 mini humeral stem.  This gave a very good fit.  Then a trial  reduction was then performed with multiple offset trays and polyethylene liners.  The one that gave the optimal stability and range of motion was the +6 taper offset 40 mm diameter with highly cross-linked 36 bearing.  These were placed after the true  stem  was placed into the humerus.  This gave very good stability with forward flexion, abduction as well as extension and adduction.  Difficult to dislocate the prosthesis.  At this time, thorough irrigation was performed.  The subscap was then repaired  with the arm in about 30 degrees of external rotation.  Three drill holes were placed  prior to placement of the stem.  This gave a reasonably secure repair, although tissue quality was not great.  It should be noted that Aricept irrigating solution was  utilized at all times during this case in order to diminish infection.  Vancomycin powder was then placed within the incision after thorough irrigation was performed.  Deltopectoral split was then closed using #1 Vicryl suture followed by interrupted  inverted 0 Vicryl suture, 2-0 Vicryl suture, and a 3-0 Monocryl.  The vessel loop was removed from the axillary nerve, which was palpated and intact prior to final closure.  The patient tolerated the procedure well without immediate complications and  transferred to the recovery room in stable condition.  LN/NUANCE  D:07/02/2018 T:07/02/2018 JOB:[id number]

## 2018-07-02 NOTE — Brief Op Note (Signed)
   07/02/2018  10:56 AM  PATIENT:  [name]  78 y.o. female  PRE-OPERATIVE DIAGNOSIS:  right shoulder osteoarthritis  POST-OPERATIVE DIAGNOSIS:  right shoulder osteoarthritis  PROCEDURE:  Procedure(s): RIGHT REVERSE SHOULDER ARTHROPLASTY  SURGEON:  Surgeon(s): Marlou Sa, Tonna Corner, MD  ASSISTANT: Nyoka Cowden RNFA  ANESTHESIA:   general  EBL: 100 ml    Total I/O In: 1100 [I.V.:1100] Out: 100 [Blood:100]  BLOOD ADMINISTERED: none  DRAINS: none   LOCAL MEDICATIONS USED:  none  SPECIMEN:  No Specimen  COUNTS:  YES  TOURNIQUET:  * No tourniquets in log *  DICTATION: .Other Dictation: Dictation Number [id number]  PLAN OF CARE: Admit to inpatient   PATIENT DISPOSITION:  PACU - hemodynamically stable

## 2018-07-02 NOTE — Anesthesia Procedure Notes (Signed)
Anesthesia Regional Block: Interscalene brachial plexus block   Pre-Anesthetic Checklist: ,, timeout performed, Correct Patient, Correct Site, Correct Laterality, Correct Procedure, Correct Position, site marked, Risks and benefits discussed, pre-op evaluation,  At surgeon's request and post-op pain management  Laterality: Right  Prep: Maximum Sterile Barrier Precautions used, chloraprep       Needles:  Injection technique: Single-shot  Needle Type: Echogenic Stimulator Needle     Needle Length: 5cm  Needle Gauge: 22     Additional Needles:   Procedures:, nerve stimulator,,, ultrasound used (permanent image in chart),,,,   Nerve Stimulator or Paresthesia:  Response: Biceps response,   Additional Responses:   Narrative:  Start time: 07/02/2018 7:01 AM End time: 07/02/2018 7:11 AM Injection made incrementally with aspirations every 5 mL. Anesthesiologist: Roderic Palau, MD  Additional Notes: 2% Lidocaine skin wheel.

## 2018-07-02 NOTE — Plan of Care (Signed)
  Problem: Clinical Measurements: Goal: Ability to maintain clinical measurements within normal limits will improve Outcome: Progressing   Problem: Nutrition: Goal: Adequate nutrition will be maintained Outcome: Progressing   Problem: Coping: Goal: Level of anxiety will decrease Outcome: Progressing   Problem: Pain Managment: Goal: General experience of comfort will improve Outcome: Progressing   Problem: Safety: Goal: Ability to remain free from injury will improve Outcome: Progressing   Problem: Skin Integrity: Goal: Risk for impaired skin integrity will decrease Outcome: Progressing   

## 2018-07-03 MED ORDER — HYDROCODONE-ACETAMINOPHEN 5-325 MG PO TABS: 1.0000 | ORAL_TABLET | ORAL | 0 refills | Status: DC | PRN

## 2018-07-03 MED ORDER — METHOCARBAMOL 500 MG PO TABS: 500.0000 mg | ORAL_TABLET | Freq: Four times a day (QID) | ORAL | 0 refills | Status: DC | PRN

## 2018-07-03 NOTE — Evaluation (Signed)
Occupational Therapy Evaluation Patient Details Name: Morgan BleakMary P Plush MRN: [id number] DOB: Dec 13, 1940 Today's Date: 07/03/2018    History of Present Illness Pt is a 78 y/o female now s/p reverse R TSA.    Clinical Impression   This 78 y/o female presents with the above. PTA pt reports independence with ADL, iADL and functional mobility. Pt presents supine in bed agreeable to therapy session. Pt still feeling nerve block in RUE and unable to actively move at this time. Pt currently requiring minA for LB ADL, mod-maxA for UB ADL including sling management. Reviewed shoulder precautions, sling management, HEP, safety and compensatory techniques for performing ADL while maintaining precautions; pt requiring min cues for maintaining but overall with good carryover. She lives alone but reports daughter plans to stay initially to assist with ADL/iADL PRN. Questions answered throughout with pt reports feeling comfortable performing ADL and functional tasks after return home. Recommend pt follow up with therapies as recommended per MD after discharge. Pt anticipating d/c home today.     Follow Up Recommendations  Follow surgeon's recommendation for DC plan and follow-up therapies;Supervision/Assistance - 24 hour    Equipment Recommendations  None recommended by OT           Precautions / Restrictions Precautions Precautions: Fall;Shoulder Type of Shoulder Precautions: sling, NWB RUE, okay for AROM e/w/h; okay for A/PROM within following limits: FF 0-90, ABD 0-60, ER 0-30 Shoulder Interventions: Shoulder sling/immobilizer;At all times;Off for dressing/bathing/exercises Precaution Booklet Issued: Yes (comment) Precaution Comments: issued and reviewed with pt  Required Braces or Orthoses: Sling Restrictions Weight Bearing Restrictions: Yes RUE Weight Bearing: Non weight bearing      Mobility Bed Mobility Overal bed mobility: Needs Assistance Bed Mobility: Supine to Sit;Sit to Supine      Supine to sit: Supervision Sit to supine: Supervision   General bed mobility comments: HOB elevated, for general safety, pt reports plans to sleep in recliner initially  Transfers Overall transfer level: Needs assistance Equipment used: None Transfers: Sit to/from Stand Sit to Stand: Supervision         General transfer comment: for general safety and immediate standing balance    Balance Overall balance assessment: Mild deficits observed, not formally tested                                         ADL either performed or assessed with clinical judgement   ADL Overall ADL's : Needs assistance/impaired Eating/Feeding: Modified independent;Sitting   Grooming: Min guard;Set up;Sitting   Upper Body Bathing: Minimal assistance;Sitting   Lower Body Bathing: Minimal assistance;Sit to/from stand   Upper Body Dressing : Minimal assistance;Moderate assistance;Sitting Upper Body Dressing Details (indicate cue type and reason): assist to button shirt, assist for sling management; assist for bra managment (pt able to wear bra without R strap over shoulder) Lower Body Dressing: Minimal assistance;Sit to/from stand Lower Body Dressing Details (indicate cue type and reason): assist to advance underwear/pants over hips Toilet Transfer: Min guard;Minimal assistance;Ambulation Toilet Transfer Details (indicate cue type and reason): simulated via transfer to/from EOB Toileting- Clothing Manipulation and Hygiene: Minimal assistance;Sit to/from stand       Functional mobility during ADLs: Minimal assistance General ADL Comments: reviewed safety and compensatory techniques for completing ADL while maintaining shoulder precautions; educated in precautions, sling management and HEP     Vision         Perception  Praxis      Pertinent Vitals/Pain       Hand Dominance Right   Extremity/Trunk Assessment Upper Extremity Assessment Upper Extremity Assessment: RUE  deficits/detail RUE Deficits / Details: pt still feeling nerve block at this time, minimally able to wiggle digits; pt reports previous R wrist injury last fall 2019 RUE: Unable to fully assess due to pain;Unable to fully assess due to immobilization RUE Sensation: decreased light touch   Lower Extremity Assessment Lower Extremity Assessment: Overall WFL for tasks assessed       Communication Communication Communication: No difficulties   Cognition Arousal/Alertness: Awake/alert Behavior During Therapy: WFL for tasks assessed/performed Overall Cognitive Status: Within Functional Limits for tasks assessed                                     General Comments       Exercises Exercises: Shoulder;General Upper Extremity;Hand exercises General Exercises - Upper Extremity Shoulder Flexion: PROM;Other reps (comment);Right(3x, 0-45* today, limited due to pt still has nerve block) Shoulder ABduction: PROM;Right;Other reps (comment)(3x, 0-40* today, limited due to pt still has nerve block) Elbow Flexion: PROM;Right;Other reps (comment)(3x, limited due to pt still has nerve block) Elbow Extension: PROM;Right;Other reps (comment)(3x, limited due to pt still has nerve block) Wrist Flexion: PROM;Right;Other reps (comment)(3x, limited due to pt still has nerve block) Wrist Extension: PROM;Right;Other reps (comment)(3x, limited due to pt still has nerve block) Digit Composite Flexion: PROM;Right Composite Extension: PROM;Right Shoulder Exercises Neck Flexion: AROM;Seated Neck Extension: AROM;Seated Neck Lateral Flexion - Right: AROM;Seated Neck Lateral Flexion - Left: AROM;Seated Hand Exercises Forearm Supination: PROM;Right Forearm Pronation: PROM;Right   Shoulder Instructions Shoulder Instructions Donning/doffing shirt without moving shoulder: Minimal assistance;Patient able to independently direct caregiver Method for sponge bathing under operated UE: Minimal  assistance;Patient able to independently direct caregiver Donning/doffing sling/immobilizer: Maximal assistance;Patient able to independently direct caregiver Correct positioning of sling/immobilizer: Moderate assistance;Patient able to independently direct caregiver ROM for elbow, wrist and digits of operated UE: Min-guard Sling wearing schedule (on at all times/off for ADL's): Modified independent Proper positioning of operated UE when showering: Min-guard Positioning of UE while sleeping: Minimal assistance;Patient able to independently direct caregiver    Home Living Family/patient expects to be discharged to:: Private residence Living Arrangements: Alone Available Help at Discharge: Family(daughter) Type of Home: House Home Access: Stairs to enter [employer] of Steps: 3 Entrance Stairs-Rails: Left Home Layout: One level     Bathroom Shower/Tub: Occupational psychologist: Handicapped height     [place]: [place] - built in;Hand held shower head;Grab bars - tub/shower;Walker - 2 wheels          Prior Functioning/Environment Level of Independence: Independent        Comments: performs iADL, Oncologist and housework, drives        OT Problem List: Decreased strength;Decreased range of motion;Decreased activity tolerance;Impaired balance (sitting and/or standing);Decreased knowledge of use of DME or AE;Decreased knowledge of precautions;Impaired UE functional use;Pain;Impaired sensation      OT Treatment/Interventions:      OT Goals(Current goals can be found in the care plan section) Acute Rehab OT Goals Patient Stated Goal: "to be able to do my hair, using my R arm" OT Goal Formulation: All assessment and education complete, DC therapy  OT Frequency:     Barriers to D/C:            Co-evaluation  AM-PAC OT "6 Clicks" Daily Activity     Outcome Measure Help from another person eating meals?: A Little Help from  another person taking care of personal grooming?: A Little Help from another person toileting, which includes using toliet, bedpan, or urinal?: A Little Help from another person bathing (including washing, rinsing, drying)?: A Little Help from another person to put on and taking off regular upper body clothing?: A Lot Help from another person to put on and taking off regular lower body clothing?: A Little 6 Click Score: 17   End of Session Equipment Utilized During Treatment: Other (comment)(sling) Nurse Communication: Mobility status  Activity Tolerance: Patient tolerated treatment well Patient left: in bed;with call bell/phone within reach;with bed alarm set  OT Visit Diagnosis: Pain;Muscle weakness (generalized) (M62.81) Pain - Right/Left: Right Pain - part of body: Shoulder                Time: 1000-1042 OT Time Calculation (min): 42 min Charges:  OT General Charges $OT Visit: 1 Visit OT Evaluation $OT Eval Low Complexity: 1 Low OT Treatments $Self Care/Home Management : 23-37 mins  Marcy SirenBreanna Cristofer Yaffe, OT [facility] Pager [phone] Office [phone]   Orlando PennerBreanna L Taavi Hoose 07/03/2018, 1:29 PM

## 2018-07-03 NOTE — Progress Notes (Signed)
  Subjective: Patient stable.  Block is still in effect.  Pain currently controlled.   Objective: Vital signs in last 24 hours: Temp:  [97.4 F (36.3 C)-97.7 F (36.5 C)] 97.6 F (36.4 C) (06/10 0454) Pulse Rate:  [60-88] 65 (06/10 0454) Resp:  [12-20] 14 (06/10 0454) BP: (99-131)/(45-75) 131/68 (06/10 0454) SpO2:  [93 %-100 %] 94 % (06/10 0454)  Intake/Output from previous day: 06/09 0701 - 06/10 0700 In: 1651.3 [P.O.:530; I.V.:1121.3] Out: 100 [Blood:100] Intake/Output this shift: No intake/output data recorded.  Exam:  No cellulitis present  Labs: Recent Labs    07/01/18 0928  HGB 15.2*   Recent Labs    07/01/18 0928  WBC 6.6  RBC 4.75  HCT 43.4  PLT 201   Recent Labs    07/01/18 0928  NA 139  K 4.7  CL 107  CO2 22  BUN 12  CREATININE 0.74  GLUCOSE 111*  CALCIUM 9.5   No results for input(s): LABPT, INR in the last 72 hours.  Assessment/Plan: Plan at this time is discharged today this afternoon.  Occupational therapy this morning.  Patient does have daughter who is going to live with her for about a week.  Discharge instructions reviewed with the patient.  Follow-up with me in about 10 days.  Postop radiographs look good.   Landry Dyke Dean 07/03/2018, 7:44 AM

## 2018-07-03 NOTE — Plan of Care (Signed)
  Problem: Activity: Goal: Risk for activity intolerance will decrease Outcome: Adequate for Discharge   Problem: Elimination: Goal: Will not experience complications related to urinary retention Outcome: Completed/Met   Problem: Pain Managment: Goal: General experience of comfort will improve Outcome: Adequate for Discharge   Problem: Safety: Goal: Ability to remain free from injury will improve Outcome: Adequate for Discharge   

## 2018-07-03 NOTE — Progress Notes (Signed)
Pt given discharge instructions and gone over with her. All questions answered, pt verbalized understanding. All belongings gathered to be sent home.

## 2018-07-04 ENCOUNTER — Encounter (HOSPITAL_COMMUNITY): Payer: Self-pay | Admitting: Orthopedic Surgery

## 2018-07-04 NOTE — Discharge Summary (Signed)
Physician Discharge Summary      Patient ID: [name] MRN: [id number] DOB/AGE: May 08, 1940 78 y.o.  Admit date: 07/02/2018 Discharge date: 07/03/2018  Admission Diagnoses:  Active Problems:   Shoulder arthritis   Discharge Diagnoses:  Same  Surgeries: Procedure(s): RIGHT REVERSE SHOULDER ARTHROPLASTY on 07/02/2018   Consultants:   Discharged Condition: Stable  Hospital Course: [name] is an 78 y.o. female who was admitted 07/02/2018 with a chief complaint of right shoulder pain, and found to have a diagnosis of right shoulder rotator cuff arthropathy.  They were brought to the operating room on 07/02/2018 and underwent the above named procedures.  Patient tolerated procedure well.  She was seen by occupational therapy to begin passive range of motion of the shoulder.  Discharged home in good condition with pain control.  She will follow-up with me in approximately 10 days for repeat evaluation.  CPM machine 3 hours a day.  No lifting with the right shoulder.  Antibiotics given:  Anti-infectives (From admission, onward)   Start     Dose/Rate Route Frequency Ordered Stop   07/02/18 1600  ceFAZolin (ANCEF) IVPB 1 g/50 mL premix     1 g 100 mL/hr over 30 Minutes Intravenous Every 6 hours 07/02/18 1411 07/03/18 0226   07/02/18 0935  vancomycin (VANCOCIN) powder  Status:  Discontinued       As needed 07/02/18 0935 07/02/18 1035   07/02/18 0600  ceFAZolin (ANCEF) IVPB 2g/100 mL premix     2 g 200 mL/hr over 30 Minutes Intravenous On call to O.R. 07/02/18 0559 07/02/18 0755    .  Recent vital signs:  Vitals:   07/03/18 0454 07/03/18 0752  BP: 131/68 133/67  Pulse: 65 (!) 58  Resp: 14 16  Temp: 97.6 F (36.4 C) (!) 97.5 F (36.4 C)  SpO2: 94% 96%    Recent laboratory studies:  Results for orders placed or performed during the hospital encounter of 07/01/18  SARS Coronavirus 2 (CEPHEID - Performed in [place] hospital lab), [facility] Order   Specimen: Nasopharyngeal  Swab  Result Value Ref Range   SARS Coronavirus 2 NEGATIVE NEGATIVE    Discharge Medications:   Allergies as of 07/03/2018      Reactions   Sulfa Antibiotics Nausea And Vomiting      Medication List    STOP taking these medications   acetaminophen 500 MG tablet Commonly known as: TYLENOL   pregabalin 75 MG capsule Commonly known as: LYRICA     TAKE these medications   aspirin EC 81 MG tablet Take 81 mg by mouth daily.   B-12 1000 MCG/ML Kit Inject 1,000 mcg as directed every 30 (thirty) days.   diclofenac 50 MG EC tablet Commonly known as: VOLTAREN Take 50 mg by mouth daily.   HYDROcodone-acetaminophen 5-325 MG tablet Commonly known as: NORCO/VICODIN Take 1-2 tablets by mouth every 4 (four) hours as needed for moderate pain (pain score 4-6).   methocarbamol 500 MG tablet Commonly known as: ROBAXIN Take 1 tablet (500 mg total) by mouth every 6 (six) hours as needed for muscle spasms.   multivitamin capsule Take 1 capsule by mouth daily. Centrum   simvastatin 10 MG tablet Commonly known as: ZOCOR Take 10 mg by mouth at bedtime.       Diagnostic Studies: Dg Shoulder Right Port  Result Date: 07/02/2018 CLINICAL DATA:  Status post right shoulder replacement today. EXAM: PORTABLE RIGHT SHOULDER COMPARISON:  CT right shoulder 12/10/2017. FINDINGS: New reverse shoulder arthroplasty  is in place. The device is located. No fracture or other acute abnormality. Gas in the soft tissues from surgery noted. IMPRESSION: Status post reverse right shoulder replacement.  No acute finding. Electronically Signed   By: Inge Rise M.D.   On: 07/02/2018 11:25    Disposition:   Discharge Instructions    Call MD / Call 911   Complete by: As directed    If you experience chest pain or shortness of breath, CALL 911 and be transported to the hospital emergency room.  If you develope a fever above 101 F, pus (white drainage) or increased drainage or redness at the wound, or calf  pain, call your surgeon's office.   Constipation Prevention   Complete by: As directed    Drink plenty of fluids.  Prune juice may be helpful.  You may use a stool softener, such as Colace (over the counter) 100 mg twice a day.  Use MiraLax (over the counter) for constipation as needed.   Diet - low sodium heart healthy   Complete by: As directed    Discharge instructions   Complete by: As directed    Use the shoulder range of motion machine 1 hour 3 times a day No lifting with right arm Okay to be out of the sling sometimes when walking around. Okay to shower because dressing is waterproof Follow-up in approximately 10 days   Increase activity slowly as tolerated   Complete by: As directed          Signed: Anderson Malta 07/04/2018, 2:54 PM

## 2018-07-08 DIAGNOSIS — E785 Hyperlipidemia, unspecified: Secondary | ICD-10-CM | POA: Diagnosis not present

## 2018-07-08 DIAGNOSIS — Z96611 Presence of right artificial shoulder joint: Secondary | ICD-10-CM | POA: Diagnosis not present

## 2018-07-08 DIAGNOSIS — G609 Hereditary and idiopathic neuropathy, unspecified: Secondary | ICD-10-CM | POA: Diagnosis not present

## 2018-07-08 DIAGNOSIS — Z8673 Personal history of transient ischemic attack (TIA), and cerebral infarction without residual deficits: Secondary | ICD-10-CM | POA: Diagnosis not present

## 2018-07-08 DIAGNOSIS — E538 Deficiency of other specified B group vitamins: Secondary | ICD-10-CM | POA: Diagnosis not present

## 2018-07-08 DIAGNOSIS — Z471 Aftercare following joint replacement surgery: Secondary | ICD-10-CM | POA: Diagnosis not present

## 2018-07-09 ENCOUNTER — Telehealth: Payer: Self-pay | Admitting: Orthopedic Surgery

## 2018-07-09 NOTE — Telephone Encounter (Signed)
[place] for orders? Any restrictions?

## 2018-07-09 NOTE — Telephone Encounter (Signed)
Joey Physical Therapist called in requesting verbal orders for plan of physical therapy for 1 time a week for 2 weeks.  [phone]

## 2018-07-09 NOTE — Telephone Encounter (Signed)
Ok for pt aarom and prom to pain tolerance2 x week 6 w thx

## 2018-07-10 ENCOUNTER — Telehealth: Payer: Self-pay | Admitting: Orthopedic Surgery

## 2018-07-10 DIAGNOSIS — G609 Hereditary and idiopathic neuropathy, unspecified: Secondary | ICD-10-CM | POA: Diagnosis not present

## 2018-07-10 DIAGNOSIS — Z96611 Presence of right artificial shoulder joint: Secondary | ICD-10-CM | POA: Diagnosis not present

## 2018-07-10 DIAGNOSIS — E785 Hyperlipidemia, unspecified: Secondary | ICD-10-CM | POA: Diagnosis not present

## 2018-07-10 DIAGNOSIS — Z471 Aftercare following joint replacement surgery: Secondary | ICD-10-CM | POA: Diagnosis not present

## 2018-07-10 DIAGNOSIS — E538 Deficiency of other specified B group vitamins: Secondary | ICD-10-CM | POA: Diagnosis not present

## 2018-07-10 DIAGNOSIS — Z8673 Personal history of transient ischemic attack (TIA), and cerebral infarction without residual deficits: Secondary | ICD-10-CM | POA: Diagnosis not present

## 2018-07-10 NOTE — Telephone Encounter (Signed)
[name] with [name] request verbal orders for OT twice a week for two weeks. [name]'s callback # [phone]

## 2018-07-10 NOTE — Telephone Encounter (Signed)
IC s/w therapist and advised per Dr Marlou Sa

## 2018-07-10 NOTE — Telephone Encounter (Signed)
IC verbal given.  

## 2018-07-12 ENCOUNTER — Telehealth: Payer: Self-pay | Admitting: Orthopedic Surgery

## 2018-07-12 ENCOUNTER — Other Ambulatory Visit: Payer: Self-pay | Admitting: Orthopedic Surgery

## 2018-07-12 MED ORDER — OXYCODONE HCL 5 MG PO TABS: ORAL_TABLET | ORAL | 0 refills | Status: DC

## 2018-07-12 NOTE — Telephone Encounter (Signed)
Malorie Physical Therapist with [name] at [employer] in said she has some questions about the pt protocol. [phone]

## 2018-07-12 NOTE — Telephone Encounter (Signed)
IC s/w her and she stated patient painful past 80 on CPM I advised ok to stay at 80 and slowly work her way past She also mentioned patient out of pain medication as of today. Dr Marlou Sa submitted meds to pharmacy.

## 2018-07-17 ENCOUNTER — Other Ambulatory Visit: Payer: Self-pay

## 2018-07-17 ENCOUNTER — Ambulatory Visit: Payer: Self-pay

## 2018-07-17 ENCOUNTER — Encounter: Payer: Self-pay | Admitting: Orthopedic Surgery

## 2018-07-17 ENCOUNTER — Ambulatory Visit (INDEPENDENT_AMBULATORY_CARE_PROVIDER_SITE_OTHER): Payer: PPO | Admitting: Orthopedic Surgery

## 2018-07-17 VITALS — Ht 61.0 in | Wt 153.0 lb

## 2018-07-17 DIAGNOSIS — M19011 Primary osteoarthritis, right shoulder: Secondary | ICD-10-CM

## 2018-07-17 MED ORDER — HYDROCODONE-ACETAMINOPHEN 5-325 MG PO TABS: 1.0000 | ORAL_TABLET | ORAL | 0 refills | Status: DC | PRN

## 2018-07-17 NOTE — Progress Notes (Signed)
Office Visit Note   Patient: [name]           Date of Birth: Oct 16, 1940           MRN: [id number] Visit Date: 07/17/2018 Requested by: Carylon PerchesFagan, Roy, MD [address] [place] [id number] PCP: Carylon PerchesFagan, Roy, MD  Subjective: Chief Complaint  Patient presents with  . Right Shoulder - Routine Post Op    07/02/18 right shoulder RSA    HPI: Morgan DandyMary is a 78 year old patient with right reverse shoulder replacement now about 2 weeks out.  She has been doing well.  She is spending time out of the sling.  On exam the incision is intact.  Deltoid is functional and range of motion is improving.  She is at 1392 on her CPM.  I will refill her Norco and [facility].  Come back in 4 weeks for clinical recheck.  Still want her to be careful with any lifting until I see her back at that time.              ROS: See above  Assessment & Plan: Visit Diagnoses:  1. Primary osteoarthritis, right shoulder     Plan: See above  Follow-Up Instructions: Return in about 4 weeks (around 08/14/2018).   Orders:  Orders Placed This Encounter  Procedures  . Ambulatory referral to Physical Therapy   Meds ordered this encounter  Medications  . HYDROcodone-acetaminophen (NORCO/VICODIN) 5-325 MG tablet    Sig: Take 1-2 tablets by mouth every 4 (four) hours as needed for moderate pain (pain score 4-6).    Dispense:  30 tablet    Refill:  0      Procedures: No procedures performed   Clinical Data: No additional findings.  Objective: Vital Signs: Ht 5\' 1"  (1.549 m)   Wt 153 lb (69.4 kg)   BMI 28.91 kg/m   Physical Exam: See above  Ortho Exam: See above  Specialty Comments:  No specialty comments available.  Imaging: No results found.   PMFS History: Patient Active Problem List   Diagnosis Date Noted  . 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 Colles' fracture of right radius 09/04/17 09/20/2017  . Pelvic pressure in female 05/11/2015  . Hematuria  05/11/2015  . Vaginal atrophy 05/11/2015  . Encounter for screening colonoscopy 10/08/2013  . Ankle arthritis 05/19/2013  . Left ankle strain 05/19/2013  . Left ankle pain 05/19/2013  . PRIMARY LOCALIZED OSTEOARTHROSIS LOWER LEG 04/15/2008  . BUCKET HANDLE TEAR OF LATERAL MENISCUS 04/15/2008  . TEAR LATERAL MENISCUS 04/15/2008  . KNEE, ARTHRITIS, DEGEN./OSTEO 01/08/2008  . DERANGEMENT MENISCUS 01/08/2008  . LOOSE BODY-KNEE 01/08/2008  . KNEE PAIN 01/08/2008   Past Medical History:  Diagnosis Date  . B12 deficiency    managed by Dr. Ouida SillsFagan: monthly injections  . Hematuria 05/11/2015  . Hemorrhoids   . Hyperlipemia   . Impaired fasting glucose   . Neuropathy    hereditary and idiopathic  . Osteoarthritis   . Pelvic pressure in female 05/11/2015  . PONV (postoperative nausea and vomiting)   . Transient cerebral ischemic attack    no impairment  . Vaginal atrophy 05/11/2015    Family History  Problem Relation Age of Onset  . Heart disease Daughter        transposition of great arteries/on fourth pacemaker  . Heart disease Mother   . Glaucoma Mother   . Arthritis Father        rheumatoid  .  Alcohol abuse Father   . Diabetes Father   . Glaucoma Sister   . Arthritis Brother        rheumatoid  . Colon cancer Neg Hx   . Colon polyps Neg Hx   . Breast cancer Neg Hx   . Lung cancer Neg Hx     Past Surgical History:  Procedure Laterality Date  . ABDOMINAL HYSTERECTOMY    . ANKLE FRACTURE SURGERY     left  . APPENDECTOMY    . CATARACT EXTRACTION    . COLONOSCOPY  09/30/2003   RMR: Normal colon. Subtle abnormalities in the ileocecal valve and terminal  ileum as described above of doubtful clinical significance/Normal rectum  . COLONOSCOPY N/A 11/03/2013   Procedure: COLONOSCOPY;  Surgeon: Daneil Dolin, MD;  [facility];  Service: Endoscopy;  Laterality: N/A;  830 - moved to 10:45 - Ginger to notify pt  . REVERSE SHOULDER ARTHROPLASTY Right 07/02/2018  .  REVERSE SHOULDER ARTHROPLASTY Right 07/02/2018   Procedure: RIGHT REVERSE SHOULDER ARTHROPLASTY;  Surgeon: Meredith Pel, MD;  Location: [place];  Service: Orthopedics;  Laterality: Right;  . right leg fracture surgery     right  . TUBAL LIGATION     Social History   Occupational History  . Not on file  Tobacco Use  . Smoking status: Never Smoker  . Smokeless tobacco: Never Used  Substance and Sexual Activity  . Alcohol use: No  . Drug use: No  . Sexual activity: Not Currently    Birth control/protection: Surgical    Comment: hyst

## 2018-07-18 ENCOUNTER — Telehealth: Payer: Self-pay | Admitting: Orthopedic Surgery

## 2018-07-18 DIAGNOSIS — M19011 Primary osteoarthritis, right shoulder: Secondary | ICD-10-CM

## 2018-07-18 NOTE — Telephone Encounter (Signed)
Ok for pt [place] ok for bra

## 2018-07-18 NOTE — Telephone Encounter (Signed)
Put order in for PT Advised patient ok for bra.

## 2018-07-18 NOTE — Telephone Encounter (Signed)
Please advise. Thanks.  

## 2018-07-18 NOTE — Telephone Encounter (Signed)
Patient called left voicemail message asking if her outpatient (PT) can be scheduled in [place]? Patient also asked when can she wear her bra?  The number to contact patient is [phone]

## 2018-07-25 ENCOUNTER — Encounter (HOSPITAL_COMMUNITY): Payer: Self-pay

## 2018-07-25 ENCOUNTER — Ambulatory Visit (HOSPITAL_COMMUNITY): Payer: PPO | Attending: Orthopedic Surgery

## 2018-07-25 ENCOUNTER — Other Ambulatory Visit: Payer: Self-pay

## 2018-07-25 DIAGNOSIS — M25511 Pain in right shoulder: Secondary | ICD-10-CM | POA: Diagnosis not present

## 2018-07-25 DIAGNOSIS — R29898 Other symptoms and signs involving the musculoskeletal system: Secondary | ICD-10-CM | POA: Diagnosis not present

## 2018-07-25 DIAGNOSIS — M25611 Stiffness of right shoulder, not elsewhere classified: Secondary | ICD-10-CM

## 2018-07-25 NOTE — Therapy (Signed)
[facility] [address] [place], [place], [address] Phone: [phone]   Fax:  [phone]  Occupational Therapy Evaluation  Patient Details  Name: [name] MRN: [id number] Date of Birth: 1940/11/20 Referring Provider (OT): Rise PaganiniGregory Dean, MD   Encounter Date: 07/25/2018  OT End of Session - 07/25/18 1622    Visit Number  1    Number of Visits  12    Date for OT Re-Evaluation  09/05/18    Authorization Type  [employer]    Authorization Time Period  $15 co pay. No visit limit.    OT Start Time  1430    OT Stop Time  1515    OT Time Calculation (min)  45 min    Activity Tolerance  Patient tolerated treatment well    Behavior During Therapy  WFL for tasks assessed/performed       Past Medical History:  Diagnosis Date  . B12 deficiency    managed by Dr. Ouida SillsFagan: monthly injections  . Hematuria 05/11/2015  . Hemorrhoids   . Hyperlipemia   . Impaired fasting glucose   . Neuropathy    hereditary and idiopathic  . Osteoarthritis   . Pelvic pressure in female 05/11/2015  . PONV (postoperative nausea and vomiting)   . Transient cerebral ischemic attack    no impairment  . Vaginal atrophy 05/11/2015    Past Surgical History:  Procedure Laterality Date  . ABDOMINAL HYSTERECTOMY    . ANKLE FRACTURE SURGERY     left  . APPENDECTOMY    . CATARACT EXTRACTION    . COLONOSCOPY  09/30/2003   RMR: Normal colon. Subtle abnormalities in the ileocecal valve and terminal  ileum as described above of doubtful clinical significance/Normal rectum  . COLONOSCOPY N/A 11/03/2013   Procedure: COLONOSCOPY;  Surgeon: Corbin Adeobert M Rourk, MD;  [facility];  Service: Endoscopy;  Laterality: N/A;  830 - moved to 10:45 - Ginger to notify pt  . REVERSE SHOULDER ARTHROPLASTY Right 07/02/2018  . REVERSE SHOULDER ARTHROPLASTY Right 07/02/2018   Procedure: RIGHT REVERSE SHOULDER ARTHROPLASTY;  Surgeon: Cammy Copaean, Gregory Scott, MD;  [facility];  Service: Orthopedics;  Laterality: Right;  . right leg fracture surgery     right  . TUBAL LIGATION      There were no vitals filed for this visit.  Subjective Assessment - 07/25/18 1437    Subjective   S: I'mve been working on the exercises I got from the hospital.    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 Patient is wearing the sling only out in the community. She has been completing her exercises provided by the OT in the hospital. Dr. August Saucerean has referred patient to occupational therapy for evaluation and treatment.    Special Tests  FOTO 50/100    Patient Stated Goals  To be able to go to the beach.    Currently in Pain?  Yes    Pain Score  7     Pain Location  Shoulder    Pain Orientation  Right    Pain Descriptors / Indicators  Sore;Throbbing    Pain Type  Surgical pain    Pain Radiating Towards  Radiates to deltoid/bicep at times.    Pain Onset  More than a month ago    Pain Frequency  Intermittent    Aggravating Factors   Increased use of my right hand.  Pain Relieving Factors  resting,  pain medication, ice    Effect of Pain on Daily Activities  Pt is unable to utilize her RUE for any daily tasks at this time.    Multiple Pain Sites  No        [facility] OT Assessment - 07/25/18 1440      Assessment   Medical Diagnosis  right RSA    Referring Provider (OT)  Rise PaganiniGregory Dean, MD    Onset Date/Surgical Date  07/02/18    Hand Dominance  Right    Next MD Visit  08/14/18    Prior Therapy  None      Precautions   Precautions  Shoulder    Type of Shoulder Precautions  OK for ROM and strengthening no greater than 2lbs.     Shoulder Interventions  Shoulder sling/immobilizer;For comfort      Restrictions   Weight Bearing Restrictions  No      Balance Screen   Has the patient fallen in the past 6 months  No      Home  Environment   Family/patient expects to be discharged to:  Private residence    Lives With  Alone       Prior Function   Level of Independence  Independent    Vocation  Retired    Leisure  Enjoys gardening, staying active      ADL   ADL comments  Unable to utilize her RUE for any daily tasks at this time.       Mobility   Mobility Status  Independent      Written Expression   Dominant Hand  Right      Vision - History   Baseline Vision  No visual deficits      Cognition   Overall Cognitive Status  Within Functional Limits for tasks assessed      Observation/Other Assessments   Focus on Therapeutic Outcomes (FOTO)   50/100      ROM / Strength   AROM / PROM / Strength  AROM;PROM;Strength      Palpation   Palpation comment  Max fascial restrictions noted in right upper arm, trapezius, and scapularis region.       AROM   Overall AROM Comments  Assessed seated. IR/er adducted.    AROM Assessment Site  Shoulder    Right/Left Shoulder  Right    Right Shoulder Flexion  105 Degrees    Right Shoulder ABduction  120 Degrees    Right Shoulder Internal Rotation  90 Degrees    Right Shoulder External Rotation  15 Degrees      PROM   Overall PROM Comments  Assessed seated. IR/er adducted    PROM Assessment Site  Shoulder    Right/Left Shoulder  Right    Right Shoulder Flexion  115 Degrees    Right Shoulder ABduction  180 Degrees    Right Shoulder Internal Rotation  90 Degrees    Right Shoulder External Rotation  35 Degrees      Strength   Overall Strength Comments  Assessed seated. IR/er adducted    Strength Assessment Site  Shoulder    Right/Left Shoulder  Right    Right Shoulder Flexion  3-/5    Right Shoulder ABduction  3-/5    Right Shoulder Internal Rotation  3/5    Right Shoulder External Rotation  3-/5                      OT  Education - 07/25/18 1622    Education Details  table slides. interferior glide stretch    Person(s) Educated  Patient    Methods  Explanation;Demonstration;Verbal cues;Handout    Comprehension  Returned demonstration;Verbalized  understanding       OT Short Term Goals - 07/25/18 1656      OT SHORT TERM GOAL #1   Title  Patient will be educated and independent with HEP to facilitate progress in therapy and allow her to return to using her RUE as her dominant extremity.    Time  3    Period  Weeks    Status  New    Target Date  08/15/18      OT SHORT TERM GOAL #2   Title  Patient will increase P/ROM of her RUE to WNL in order to increase her ability to get her shirts on and off with less difficulty.    Time  3    Period  Weeks    Status  New        OT Long Term Goals - 07/25/18 1659      OT LONG TERM GOAL #1   Title  Patient will increase A/ROM of RUE to WNL in order to increase ability to reach over head and out to the side with less difficulty.    Time  6    Period  Weeks    Status  New    Target Date  09/05/18      OT LONG TERM GOAL #2   Title  Patient will increase RUE strength to 4+/5 in order to return to gardening and normal household lifting tasks.    Time  6    Period  Weeks    Status  New      OT LONG TERM GOAL #3   Title  Patient will decrease fascial restrictions to min amount or less in order to increase her functional mobility needed for reaching above shoulder level.    Time  6    Period  Weeks    Status  New      OT LONG TERM GOAL #4   Title  Patient will report a decrease in pain level of approximately 3/10 or less when completing daily tasks with her RUE.    Time  6    Period  Weeks    Status  New            Plan - 07/25/18 1623    Clinical Impression Statement  A: Patient is a 78 y/o female S/P right RSA causing increased pain, fascial restrictions, and decreased ROM and strength resulting in difficulty completing daily tasks using her RUE as her dominant extremity.    OT Occupational Profile and History  Problem Focused Assessment - Including review of records relating to presenting problem    Occupational performance deficits (Please refer to evaluation for  details):  ADL's;IADL's;Rest and Sleep;Leisure    Body Structure / Function / Physical Skills  ADL;ROM;Fascial restriction;Mobility;Strength;Flexibility;UE functional use;Pain;Decreased knowledge of precautions    Rehab Potential  Excellent    Clinical Decision Making  Limited treatment options, no task modification necessary    Comorbidities Affecting Occupational Performance:  None    Modification or Assistance to Complete Evaluation   No modification of tasks or assist necessary to complete eval    OT Frequency  2x / week    OT Duration  6 weeks    OT Treatment/Interventions  Self-care/ADL training;Therapeutic exercise;Manual Therapy;Neuromuscular education;Ultrasound;Therapeutic activities;DME and/or  AE instruction;Cryotherapy;Electrical Stimulation;Moist Heat;Passive range of motion;Patient/family education    Plan  P: Patient will benefit from skilled OT services to increase functional performance during daily tasks and allow her to return to using her RUE as her dominant extremity. Treatment plan: myofascial release, manual stretching, AA/ROM, A/ROM, general shoulder and scapular strengthening. Update HEP next session if needed. Modalities PRN.    Consulted and Agree with Plan of Care  Patient       Patient will benefit from skilled therapeutic intervention in order to improve the following deficits and impairments:   Body Structure / Function / Physical Skills: ADL, ROM, Fascial restriction, Mobility, Strength, Flexibility, UE functional use, Pain, Decreased knowledge of precautions       Visit Diagnosis: 1. Other symptoms and signs involving the musculoskeletal system   2. Acute pain of right shoulder   3. Stiffness of right shoulder, not elsewhere classified       Problem List Patient Active Problem List   Diagnosis Date Noted  . 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 Colles' fracture of right radius 09/04/17 09/20/2017  . Pelvic pressure in female 05/11/2015  . Hematuria  05/11/2015  . Vaginal atrophy 05/11/2015  . Encounter for screening colonoscopy 10/08/2013  . Ankle arthritis 05/19/2013  . Left ankle strain 05/19/2013  . Left ankle pain 05/19/2013  . PRIMARY LOCALIZED OSTEOARTHROSIS LOWER LEG 04/15/2008  . BUCKET HANDLE TEAR OF LATERAL MENISCUS 04/15/2008  . TEAR LATERAL MENISCUS 04/15/2008  . KNEE, ARTHRITIS, DEGEN./OSTEO 01/08/2008  . DERANGEMENT MENISCUS 01/08/2008  . LOOSE BODY-KNEE 01/08/2008  . KNEE PAIN 01/08/2008   [name], OTR/L,CBIS  [phone]  07/25/2018, 5:04 PM  [facility] [address] [place], [place], [address] Phone: [phone]   Fax:  [phone]  Name: [name] MRN: [id number] Date of Birth: Jun 01, 1940

## 2018-07-25 NOTE — Patient Instructions (Addendum)
SELF MOBILIZATION - INFERIOR GLIDE - CHAIR  While seated, grasp the side of the seat with the affected arm and slowly lean the opposite direction until a gentle stretch is felt at the shoulder.   Your affected shoulder should be fully relaxed. Hold for 10 seconds. Complete 2 times.      Complete each table slide for 1-3 minutes each. 2-3 times a day.  SHOULDER: Flexion On Table   Place hands on towel placed on table, elbows straight. Lean forward with you upper body, pushing towel away from body.  ___ reps per set, ___ sets per day  Abduction (Passive)   With arm out to side, resting on towel placed on table with palm DOWN, keeping trunk away from table, lean to the side while pushing towel away from body.  Repeat ____ times. Do ____ sessions per day.  Copyright  VHI. All rights reserved.     Internal Rotation (Assistive)   Seated with elbow bent at right angle and held against side, slide arm on table surface in an inward arc keeping elbow anchored in place. Repeat ____ times. Do ____ sessions per day. Activity: Use this motion to brush crumbs off the table.  Copyright  VHI. All rights reserved.

## 2018-07-31 ENCOUNTER — Ambulatory Visit (HOSPITAL_COMMUNITY): Payer: PPO | Admitting: Specialist

## 2018-07-31 ENCOUNTER — Other Ambulatory Visit: Payer: Self-pay

## 2018-07-31 ENCOUNTER — Encounter (HOSPITAL_COMMUNITY): Payer: Self-pay | Admitting: Specialist

## 2018-07-31 DIAGNOSIS — M25511 Pain in right shoulder: Secondary | ICD-10-CM

## 2018-07-31 DIAGNOSIS — M25611 Stiffness of right shoulder, not elsewhere classified: Secondary | ICD-10-CM

## 2018-07-31 DIAGNOSIS — R29898 Other symptoms and signs involving the musculoskeletal system: Secondary | ICD-10-CM

## 2018-07-31 NOTE — Therapy (Signed)
[facility] [address] [place], [place], [address] Phone: [phone]   Fax:  [phone]  Occupational Therapy Treatment  Patient Details  Name: Morgan BleakMary P Beard MRN: [id number] Date of Birth: Jun 05, 1940 Referring Provider (OT): Rise PaganiniGregory Dean, MD   Encounter Date: 07/31/2018  OT End of Session - 07/31/18 1457    Visit Number  2    Number of Visits  12    Date for OT Re-Evaluation  09/05/18    Authorization Type  [employer]    Authorization Time Period  $15 co pay. No visit limit.    OT Start Time  1306    OT Stop Time  1350    OT Time Calculation (min)  44 min    Activity Tolerance  Patient tolerated treatment well    Behavior During Therapy  WFL for tasks assessed/performed       Past Medical History:  Diagnosis Date  . B12 deficiency    managed by Dr. Ouida Beard: monthly injections  . Hematuria 05/11/2015  . Hemorrhoids   . Hyperlipemia   . Impaired fasting glucose   . Neuropathy    hereditary and idiopathic  . Osteoarthritis   . Pelvic pressure in female 05/11/2015  . PONV (postoperative nausea and vomiting)   . Transient cerebral ischemic attack    no impairment  . Vaginal atrophy 05/11/2015    Past Surgical History:  Procedure Laterality Date  . ABDOMINAL HYSTERECTOMY    . ANKLE FRACTURE SURGERY     left  . APPENDECTOMY    . CATARACT EXTRACTION    . COLONOSCOPY  09/30/2003   RMR: Normal colon. Subtle abnormalities in the ileocecal valve and terminal  ileum as described above of doubtful clinical significance/Normal rectum  . COLONOSCOPY N/A 11/03/2013   Procedure: COLONOSCOPY;  Surgeon: Corbin Adeobert M Rourk, MD;  [facility];  Service: Endoscopy;  Laterality: N/A;  830 - moved to 10:45 - Ginger to notify pt  . REVERSE SHOULDER ARTHROPLASTY Right 07/02/2018  . REVERSE SHOULDER ARTHROPLASTY Right 07/02/2018   Procedure: RIGHT REVERSE SHOULDER ARTHROPLASTY;  Surgeon: Cammy Copaean, Gregory Scott, MD;  [facility];  Service: Orthopedics;  Laterality: Right;  . right leg fracture surgery     right  . TUBAL LIGATION      There were no vitals filed for this visit.  Subjective Assessment - 07/31/18 1456    Subjective   S:  I can tell my shoulder is there, it is a little sore.    Currently in Pain?  Yes    Pain Score  5     Pain Location  Shoulder    Pain Orientation  Right;Lateral    Pain Descriptors / Indicators  Aching;Sore    Pain Type  Surgical pain         [facility] OT Assessment - 07/31/18 0001      Assessment   Medical Diagnosis  right RSA    Referring Provider (OT)  Rise PaganiniGregory Dean, MD    Onset Date/Surgical Date  07/02/18    Hand Dominance  Right    Next MD Visit  08/14/18    Prior Therapy  None      Precautions   Precautions  Shoulder    Type of Shoulder Precautions  OK for ROM and strengthening no greater than 2lbs.     Shoulder Interventions  Shoulder sling/immobilizer;For comfort               OT Treatments/Exercises (OP) -  07/31/18 0001      Exercises   Exercises  Shoulder      Shoulder Exercises: Supine   Protraction  PROM;5 reps;AAROM;10 reps    Horizontal ABduction  PROM;5 reps;AAROM;10 reps    External Rotation  PROM;5 reps;AAROM;10 reps    Internal Rotation  PROM;5 reps;AAROM;10 reps    Flexion  PROM;5 reps;AAROM;10 reps    ABduction  PROM;5 reps;AAROM;10 reps      Shoulder Exercises: Seated   Elevation  AROM;10 reps    Extension  AROM;10 reps    Row  AROM;10 reps    Other Seated Exercises  a/rom:  elbow flexion/extension, supination/pronation, wrist flexion/extension 10 times each       Shoulder Exercises: Therapy Ball   Flexion  10 reps    ABduction  10 reps      Shoulder Exercises: ROM/Strengthening   Thumb Tacks  1' low    Caudal Glide  3 X 3"      Shoulder Exercises: Isometric Strengthening   Flexion  Supine;3X3"    Extension  Supine;3X3"    External Rotation  Supine;3X3"    Internal Rotation  Supine;3X3"    ABduction  Supine;3X3"     ADduction  Supine;3X3"      Manual Therapy   Manual Therapy  Myofascial release;Other (comment)    Manual therapy comments  completed seperately than all other interventions this date    Myofascial Release  myofascial release and manual stretching to right upper arm, scapular and shoulder region to decrease pain and fascial restrictions and improve pain free mobility in right shoulder region.    Other Manual Therapy  removed loose steri strips from bottom 1/2 of surgical incision and  mid 1/3 of surgical incision             OT Education - 07/31/18 1456    Education Details  reviewed plan of care and patient in agreeance    Person(s) Educated  Patient    Methods  Explanation    Comprehension  Verbalized understanding       OT Short Term Goals - 07/31/18 1459      OT SHORT TERM GOAL #1   Title  Patient will be educated and independent with HEP to facilitate progress in therapy and allow her to return to using her RUE as her dominant extremity.    Time  3    Period  Weeks    Status  On-going    Target Date  08/15/18      OT SHORT TERM GOAL #2   Title  Patient will increase P/ROM of her RUE to WNL in order to increase her ability to get her shirts on and off with less difficulty.    Time  3    Period  Weeks    Status  On-going        OT Long Term Goals - 07/31/18 1459      OT LONG TERM GOAL #1   Title  Patient will increase A/ROM of RUE to WNL in order to increase ability to reach over head and out to the side with less difficulty.    Time  6    Period  Weeks    Status  On-going      OT LONG TERM GOAL #2   Title  Patient will increase RUE strength to 4+/5 in order to return to gardening and normal household lifting tasks.    Time  6    Period  Weeks  Status  On-going      OT LONG TERM GOAL #3   Title  Patient will decrease fascial restrictions to min amount or less in order to increase her functional mobility needed for reaching above shoulder level.    Time   6    Period  Weeks    Status  On-going      OT LONG TERM GOAL #4   Title  Patient will report a decrease in pain level of approximately 3/10 or less when completing daily tasks with her RUE.    Time  6    Period  Weeks    Status  On-going            Plan - 07/31/18 1457    Clinical Impression Statement  A:  Patient able to tolerate approximately 75% p/rom this date in supine.  initiated therapeutic exercises, focusing on scapular stability and gaining aa/rom.    Body Structure / Function / Physical Skills  ADL;ROM;Fascial restriction;Mobility;Strength;Flexibility;UE functional use;Pain;Decreased knowledge of precautions    Plan  P: attempt aa/rom in seated, as well as supine, if patient is independent with technique, add to HEP.       Patient will benefit from skilled therapeutic intervention in order to improve the following deficits and impairments:   Body Structure / Function / Physical Skills: ADL, ROM, Fascial restriction, Mobility, Strength, Flexibility, UE functional use, Pain, Decreased knowledge of precautions       Visit Diagnosis: 1. Other symptoms and signs involving the musculoskeletal system   2. Acute pain of right shoulder   3. Stiffness of right shoulder, not elsewhere classified       Problem List Patient Active Problem List   Diagnosis Date Noted  . 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 Colles' fracture of right radius 09/04/17 09/20/2017  . Pelvic pressure in female 05/11/2015  . Hematuria 05/11/2015  . Vaginal atrophy 05/11/2015  . Encounter for screening colonoscopy 10/08/2013  . Ankle arthritis 05/19/2013  . Left ankle strain 05/19/2013  . Left ankle pain 05/19/2013  . PRIMARY LOCALIZED OSTEOARTHROSIS LOWER LEG 04/15/2008  . BUCKET HANDLE TEAR OF LATERAL MENISCUS 04/15/2008  . TEAR LATERAL MENISCUS 04/15/2008  . KNEE, ARTHRITIS, DEGEN./OSTEO 01/08/2008  . DERANGEMENT MENISCUS 01/08/2008  . LOOSE BODY-KNEE 01/08/2008  . KNEE PAIN 01/08/2008     Shirlean MylarBethany H. , MHA, OTR/L [phone]  07/31/2018, 3:06 PM  [facility] [address] [place], [place], [address] Phone: [phone]   Fax:  [phone]  Name: Morgan BleakMary P Beard MRN: [id number] Date of Birth: 07/02/1940

## 2018-08-01 DIAGNOSIS — E538 Deficiency of other specified B group vitamins: Secondary | ICD-10-CM | POA: Diagnosis not present

## 2018-08-02 ENCOUNTER — Other Ambulatory Visit: Payer: Self-pay

## 2018-08-02 ENCOUNTER — Encounter (HOSPITAL_COMMUNITY): Payer: Self-pay | Admitting: Occupational Therapy

## 2018-08-02 ENCOUNTER — Ambulatory Visit (HOSPITAL_COMMUNITY): Payer: PPO | Admitting: Occupational Therapy

## 2018-08-02 DIAGNOSIS — M25611 Stiffness of right shoulder, not elsewhere classified: Secondary | ICD-10-CM

## 2018-08-02 DIAGNOSIS — R29898 Other symptoms and signs involving the musculoskeletal system: Secondary | ICD-10-CM

## 2018-08-02 DIAGNOSIS — M25511 Pain in right shoulder: Secondary | ICD-10-CM

## 2018-08-02 NOTE — Therapy (Signed)
[place], [place], [address] Phone: [phone]   Fax:  [phone]  Occupational Therapy Treatment  Patient Details  Name: [name] MRN: [id number] Date of Birth: 1940/06/15 Referring Provider (OT): Marcene Duos, MD   Encounter Date: 08/02/2018  OT End of Session - 08/02/18 1358    Visit Number  3    Number of Visits  12    Date for OT Re-Evaluation  09/05/18    Authorization Type  [employer]    Authorization Time Period  $15 co pay. No visit limit.    OT Start Time  1315    OT Stop Time  1357    OT Time Calculation (min)  42 min    Activity Tolerance  Patient tolerated treatment well    Behavior During Therapy  WFL for tasks assessed/performed       Past Medical History:  Diagnosis Date  . B12 deficiency    managed by Dr. Willey Blade: monthly injections  . Hematuria 05/11/2015  . Hemorrhoids   . Hyperlipemia   . Impaired fasting glucose   . Neuropathy    hereditary and idiopathic  . Osteoarthritis   . Pelvic pressure in female 05/11/2015  . PONV (postoperative nausea and vomiting)   . Transient cerebral ischemic attack    no impairment  . Vaginal atrophy 05/11/2015    Past Surgical History:  Procedure Laterality Date  . ABDOMINAL HYSTERECTOMY    . ANKLE FRACTURE SURGERY     left  . APPENDECTOMY    . CATARACT EXTRACTION    . COLONOSCOPY  09/30/2003   RMR: Normal colon. Subtle abnormalities in the ileocecal valve and terminal  ileum as described above of doubtful clinical significance/Normal rectum  . COLONOSCOPY N/A 11/03/2013   Procedure: COLONOSCOPY;  Surgeon: Daneil Dolin, MD;  [facility];  Service: Endoscopy;  Laterality: N/A;  830 - moved to 10:45 - Ginger to notify pt  . REVERSE SHOULDER ARTHROPLASTY Right 07/02/2018  . REVERSE SHOULDER ARTHROPLASTY Right 07/02/2018   Procedure: RIGHT REVERSE SHOULDER ARTHROPLASTY;  Surgeon: Meredith Pel, MD;  Location: [place];  Service: Orthopedics;  Laterality: Right;  . right leg fracture surgery     right  . TUBAL LIGATION      There were no vitals filed for this visit.  Subjective Assessment - 08/02/18 1314    Subjective   S: I know it's there.    Currently in Pain?  Yes    Pain Score  6     Pain Location  Shoulder    Pain Orientation  Right    Pain Descriptors / Indicators  Aching;Sore    Pain Type  Surgical pain    Pain Radiating Towards  radiates to bicep/deltoid sometimes    Pain Onset  More than a month ago    Pain Frequency  Intermittent    Aggravating Factors   increased use of right arm    Pain Relieving Factors  resting, pain medication    Effect of Pain on Daily Activities  mod/max effect on ADLs    Multiple Pain Sites  No         [facility] OT Assessment - 08/02/18 1314      Assessment   Medical Diagnosis  right RSA      Precautions   Precautions  Shoulder    Type of Shoulder Precautions  OK for ROM and strengthening no greater than 2lbs.  Shoulder Interventions  Shoulder sling/immobilizer;For comfort               OT Treatments/Exercises (OP) - 08/02/18 1317      Exercises   Exercises  Shoulder      Shoulder Exercises: Supine   Protraction  PROM;5 reps;AAROM;10 reps    Horizontal ABduction  PROM;5 reps;AAROM;10 reps    External Rotation  PROM;5 reps;AAROM;10 reps    Internal Rotation  PROM;5 reps;AAROM;10 reps    Flexion  PROM;5 reps;AAROM;10 reps    ABduction  PROM;5 reps;AAROM;10 reps      Shoulder Exercises: Seated   Extension  AROM;10 reps    Row  AROM;10 reps    Protraction  AAROM;10 reps    Horizontal ABduction  AAROM;10 reps    External Rotation  AAROM;10 reps    Internal Rotation  AAROM;10 reps    Flexion  AAROM;10 reps    Abduction  AAROM;10 reps      Manual Therapy   Manual Therapy  Myofascial release;Other (comment)    Manual therapy comments  completed seperately than all other interventions this date    Myofascial Release  myofascial  release and manual stretching to right upper arm, scapular and shoulder region to decrease pain and fascial restrictions and improve pain free mobility in right shoulder region.             OT Education - 08/02/18 1348    Education Details  AA/ROM exercises    Person(s) Educated  Patient    Methods  Explanation;Demonstration;Handout    Comprehension  Verbalized understanding;Returned demonstration       OT Short Term Goals - 07/31/18 1459      OT SHORT TERM GOAL #1   Title  Patient will be educated and independent with HEP to facilitate progress in therapy and allow her to return to using her RUE as her dominant extremity.    Time  3    Period  Weeks    Status  On-going    Target Date  08/15/18      OT SHORT TERM GOAL #2   Title  Patient will increase P/ROM of her RUE to WNL in order to increase her ability to get her shirts on and off with less difficulty.    Time  3    Period  Weeks    Status  On-going        OT Long Term Goals - 07/31/18 1459      OT LONG TERM GOAL #1   Title  Patient will increase A/ROM of RUE to WNL in order to increase ability to reach over head and out to the side with less difficulty.    Time  6    Period  Weeks    Status  On-going      OT LONG TERM GOAL #2   Title  Patient will increase RUE strength to 4+/5 in order to return to gardening and normal household lifting tasks.    Time  6    Period  Weeks    Status  On-going      OT LONG TERM GOAL #3   Title  Patient will decrease fascial restrictions to min amount or less in order to increase her functional mobility needed for reaching above shoulder level.    Time  6    Period  Weeks    Status  On-going      OT LONG TERM GOAL #4   Title  Patient will report  a decrease in pain level of approximately 3/10 or less when completing daily tasks with her RUE.    Time  6    Period  Weeks    Status  On-going            Plan - 08/02/18 1358    Clinical Impression Statement  A:  Continued with manual therapy to address max fascial restrictions in deltoid and anterior shoulder regions. Pt with ROM WFL today with exception of er, continued with supine AA/ROM and added seated AA/ROM. Pt requiring occasional verbal cuing for form and technique.    Body Structure / Function / Physical Skills  ADL;ROM;Fascial restriction;Mobility;Strength;Flexibility;UE functional use;Pain;Decreased knowledge of precautions    Plan  P: Follow up on HEP. Continued with AA/ROM and add wall wash. resume prot/ret/elev/dep       Patient will benefit from skilled therapeutic intervention in order to improve the following deficits and impairments:   Body Structure / Function / Physical Skills: ADL, ROM, Fascial restriction, Mobility, Strength, Flexibility, UE functional use, Pain, Decreased knowledge of precautions       Visit Diagnosis: 1. Other symptoms and signs involving the musculoskeletal system   2. Acute pain of right shoulder   3. Stiffness of right shoulder, not elsewhere classified       Problem List Patient Active Problem List   Diagnosis Date Noted  . 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 Colles' fracture of right radius 09/04/17 09/20/2017  . Pelvic pressure in female 05/11/2015  . Hematuria 05/11/2015  . Vaginal atrophy 05/11/2015  . Encounter for screening colonoscopy 10/08/2013  . Ankle arthritis 05/19/2013  . Left ankle strain 05/19/2013  . Left ankle pain 05/19/2013  . PRIMARY LOCALIZED OSTEOARTHROSIS LOWER LEG 04/15/2008  . BUCKET HANDLE TEAR OF LATERAL MENISCUS 04/15/2008  . TEAR LATERAL MENISCUS 04/15/2008  . KNEE, ARTHRITIS, DEGEN./OSTEO 01/08/2008  . DERANGEMENT MENISCUS 01/08/2008  . LOOSE BODY-KNEE 01/08/2008  . KNEE PAIN 01/08/2008   Guadelupe Sabin, OTR/L  [phone] 08/02/2018, 2:00 PM  [place] [address] [place], [place], [address] Phone: [phone]   Fax:  [phone]  Name: [name] MRN:  [id number] Date of Birth: [birth year]

## 2018-08-02 NOTE — Patient Instructions (Signed)

## 2018-08-07 ENCOUNTER — Encounter (HOSPITAL_COMMUNITY): Payer: Self-pay

## 2018-08-07 ENCOUNTER — Other Ambulatory Visit: Payer: Self-pay

## 2018-08-07 ENCOUNTER — Ambulatory Visit (HOSPITAL_COMMUNITY): Payer: PPO

## 2018-08-07 DIAGNOSIS — M25511 Pain in right shoulder: Secondary | ICD-10-CM

## 2018-08-07 DIAGNOSIS — R29898 Other symptoms and signs involving the musculoskeletal system: Secondary | ICD-10-CM | POA: Diagnosis not present

## 2018-08-07 DIAGNOSIS — M25611 Stiffness of right shoulder, not elsewhere classified: Secondary | ICD-10-CM

## 2018-08-07 NOTE — Therapy (Signed)
[facility] [address] [place], [place], [address] Phone: [phone]   Fax:  [phone]  Occupational Therapy Treatment  Patient Details  Name: [name] Moynahan MRN: [id number] Date of Birth: September 29, 1940 Referring Provider (OT): Rise PaganiniGregory Dean, MD   Encounter Date: 08/07/2018  OT End of Session - 08/07/18 1459    Visit Number  4    Number of Visits  12    Date for OT Re-Evaluation  09/05/18    Authorization Type  [employer]    Authorization Time Period  $15 co pay. No visit limit.    OT Start Time  1435    OT Stop Time  1513    OT Time Calculation (min)  38 min    Activity Tolerance  Patient tolerated treatment well    Behavior During Therapy  WFL for tasks assessed/performed       Past Medical History:  Diagnosis Date  . B12 deficiency    managed by Dr. Ouida SillsFagan: monthly injections  . Hematuria 05/11/2015  . Hemorrhoids   . Hyperlipemia   . Impaired fasting glucose   . Neuropathy    hereditary and idiopathic  . Osteoarthritis   . Pelvic pressure in female 05/11/2015  . PONV (postoperative nausea and vomiting)   . Transient cerebral ischemic attack    no impairment  . Vaginal atrophy 05/11/2015    Past Surgical History:  Procedure Laterality Date  . ABDOMINAL HYSTERECTOMY    . ANKLE FRACTURE SURGERY     left  . APPENDECTOMY    . CATARACT EXTRACTION    . COLONOSCOPY  09/30/2003   RMR: Normal colon. Subtle abnormalities in the ileocecal valve and terminal  ileum as described above of doubtful clinical significance/Normal rectum  . COLONOSCOPY N/A 11/03/2013   Procedure: COLONOSCOPY;  Surgeon: Corbin Adeobert M Rourk, MD;  [facility];  Service: Endoscopy;  Laterality: N/A;  830 - moved to 10:45 - Ginger to notify pt  . REVERSE SHOULDER ARTHROPLASTY Right 07/02/2018  . REVERSE SHOULDER ARTHROPLASTY Right 07/02/2018   Procedure: RIGHT REVERSE SHOULDER ARTHROPLASTY;  Surgeon: Cammy Copaean, Gregory Scott, MD;  [facility];  Service: Orthopedics;  Laterality: Right;  . right leg fracture surgery     right  . TUBAL LIGATION      There were no vitals filed for this visit.  Subjective Assessment - 08/07/18 1448    Subjective   S: It's hard for me to reach out to the side. My strength is still not good. I can't lift anything more than 2lbs.    Currently in Pain?  No/denies         [facility] Assessment - 08/07/18 1450      Assessment   Medical Diagnosis  right RSA      Precautions   Precautions  Shoulder    Type of Shoulder Precautions  OK for ROM and strengthening no greater than 2lbs.                OT Treatments/Exercises (OP) - 08/07/18 1450      Exercises   Exercises  Shoulder      Shoulder Exercises: Supine   Protraction  PROM;5 reps;AAROM;12 reps    Horizontal ABduction  PROM;5 reps;AAROM;12 reps    External Rotation  PROM;5 reps;AAROM;12 reps    Internal Rotation  PROM;5 reps;AAROM;12 reps    Flexion  PROM;5 reps;AAROM;12 reps    ABduction  PROM;5 reps;AAROM;12 reps      Shoulder  Exercises: Standing   Protraction  AAROM;12 reps    Horizontal ABduction  AAROM;12 reps    External Rotation  AAROM;12 reps    Internal Rotation  AAROM;12 reps    Flexion  AAROM;12 reps    ABduction  AAROM;12 reps    Row  Theraband;10 reps    Theraband Level (Shoulder Row)  Level 2 (Red)      Shoulder Exercises: Pulleys   Flexion  1 minute    ABduction  1 minute      Shoulder Exercises: ROM/Strengthening   Wall Wash  1'    Proximal Shoulder Strengthening, Supine  10X A/ROM no rest breaks    Proximal Shoulder Strengthening, Seated  10X A/ROM no rest breaks      Manual Therapy   Manual Therapy  Myofascial release;Other (comment)    Manual therapy comments  completed seperately than all other interventions this date    Myofascial Release  myofascial release and manual stretching to right upper arm, scapular and shoulder region to decrease pain and fascial restrictions and improve pain free  mobility in right shoulder region.    Other Manual Therapy  Scar massage completed to healed incision to decrease scar tissue and allow for greater mobility during functional tasks.                OT Short Term Goals - 07/31/18 1459      OT SHORT TERM GOAL #1   Title  Patient will be educated and independent with HEP to facilitate progress in therapy and allow her to return to using her RUE as her dominant extremity.    Time  3    Period  Weeks    Status  On-going    Target Date  08/15/18      OT SHORT TERM GOAL #2   Title  Patient will increase Beard/ROM of her RUE to WNL in order to increase her ability to get her shirts on and off with less difficulty.    Time  3    Period  Weeks    Status  On-going        OT Long Term Goals - 07/31/18 1459      OT LONG TERM GOAL #1   Title  Patient will increase A/ROM of RUE to WNL in order to increase ability to reach over head and out to the side with less difficulty.    Time  6    Period  Weeks    Status  On-going      OT LONG TERM GOAL #2   Title  Patient will increase RUE strength to 4+/5 in order to return to gardening and normal household lifting tasks.    Time  6    Period  Weeks    Status  On-going      OT LONG TERM GOAL #3   Title  Patient will decrease fascial restrictions to min amount or less in order to increase her functional mobility needed for reaching above shoulder level.    Time  6    Period  Weeks    Status  On-going      OT LONG TERM GOAL #4   Title  Patient will report a decrease in pain level of approximately 3/10 or less when completing daily tasks with her RUE.    Time  6    Period  Weeks    Status  On-going            Plan -  08/07/18 1523    Clinical Impression Statement  A: Progressed to 12 repetitions for AA/ROM. Added wall wash, pulleys, and row with red theraband. VC for form and technique. patient was educated on scar massage and use of vitamin E oil once incision has completely healed.  Min fascial restrictions noted in right UE with manual techniques completed to address. Almost full range of motion is noted during passive stretching except for external rotation which is approximately 50% of range.    Occupational performance deficits (Please refer to evaluation for details):  ADL's;IADL's;Rest and Sleep;Leisure    Body Structure / Function / Physical Skills  ADL;ROM;Fascial restriction;Mobility;Strength;Flexibility;UE functional use;Pain;Decreased knowledge of precautions    Plan  Beard: Complete retraction and extension with red theraband if able to tolerate. attempt muscle energy technique to increase passive external rotation.    Consulted and Agree with Plan of Care  Patient       Patient will benefit from skilled therapeutic intervention in order to improve the following deficits and impairments:   Body Structure / Function / Physical Skills: ADL, ROM, Fascial restriction, Mobility, Strength, Flexibility, UE functional use, Pain, Decreased knowledge of precautions       Visit Diagnosis: 1. Other symptoms and signs involving the musculoskeletal system   2. Acute pain of right shoulder   3. Stiffness of right shoulder, not elsewhere classified       Problem List Patient Active Problem List   Diagnosis Date Noted  . 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 Colles' fracture of right radius 09/04/17 09/20/2017  . Pelvic pressure in female 05/11/2015  . Hematuria 05/11/2015  . Vaginal atrophy 05/11/2015  . Encounter for screening colonoscopy 10/08/2013  . Ankle arthritis 05/19/2013  . Left ankle strain 05/19/2013  . Left ankle pain 05/19/2013  . PRIMARY LOCALIZED OSTEOARTHROSIS LOWER LEG 04/15/2008  . BUCKET HANDLE TEAR OF LATERAL MENISCUS 04/15/2008  . TEAR LATERAL MENISCUS 04/15/2008  . KNEE, ARTHRITIS, DEGEN./OSTEO 01/08/2008  . DERANGEMENT MENISCUS 01/08/2008  . LOOSE BODY-KNEE 01/08/2008  . KNEE PAIN 01/08/2008   Limmie PatriciaLaura Jaquarious Grey, OTR/L,CBIS   [phone]  08/07/2018, 3:26 PM  [facility] [address] [place], [place], [address] Phone: [phone]   Fax:  [phone]  Name: [name] Pilant MRN: [id number] Date of Birth: 15-Aug-1940

## 2018-08-09 ENCOUNTER — Encounter (HOSPITAL_COMMUNITY): Payer: Self-pay | Admitting: Occupational Therapy

## 2018-08-09 ENCOUNTER — Other Ambulatory Visit: Payer: Self-pay

## 2018-08-09 ENCOUNTER — Ambulatory Visit (HOSPITAL_COMMUNITY): Payer: PPO | Admitting: Occupational Therapy

## 2018-08-09 DIAGNOSIS — R29898 Other symptoms and signs involving the musculoskeletal system: Secondary | ICD-10-CM

## 2018-08-09 DIAGNOSIS — M25511 Pain in right shoulder: Secondary | ICD-10-CM

## 2018-08-09 DIAGNOSIS — M25611 Stiffness of right shoulder, not elsewhere classified: Secondary | ICD-10-CM

## 2018-08-09 NOTE — Therapy (Signed)
[facility] [address] [place], [place], [address] Phone: [phone]   Fax:  [phone]  Occupational Therapy Treatment  Patient Details  Name: Morgan BleakMary P Beard MRN: [id number] Date of Birth: July 23, 1940 Referring Provider (OT): Rise PaganiniGregory Dean, MD   Encounter Date: 08/09/2018  OT End of Session - 08/09/18 1353    Visit Number  5    Number of Visits  12    Date for OT Re-Evaluation  09/05/18    Authorization Type  [employer]    Authorization Time Period  $15 co pay. No visit limit.    OT Start Time  1516    OT Stop Time  1554    OT Time Calculation (min)  38 min    Activity Tolerance  Patient tolerated treatment well    Behavior During Therapy  WFL for tasks assessed/performed       Past Medical History:  Diagnosis Date  . B12 deficiency    managed by Dr. Ouida SillsFagan: monthly injections  . Hematuria 05/11/2015  . Hemorrhoids   . Hyperlipemia   . Impaired fasting glucose   . Neuropathy    hereditary and idiopathic  . Osteoarthritis   . Pelvic pressure in female 05/11/2015  . PONV (postoperative nausea and vomiting)   . Transient cerebral ischemic attack    no impairment  . Vaginal atrophy 05/11/2015    Past Surgical History:  Procedure Laterality Date  . ABDOMINAL HYSTERECTOMY    . ANKLE FRACTURE SURGERY     left  . APPENDECTOMY    . CATARACT EXTRACTION    . COLONOSCOPY  09/30/2003   RMR: Normal colon. Subtle abnormalities in the ileocecal valve and terminal  ileum as described above of doubtful clinical significance/Normal rectum  . COLONOSCOPY N/A 11/03/2013   Procedure: COLONOSCOPY;  Surgeon: Corbin Adeobert M Rourk, MD;  [facility];  Service: Endoscopy;  Laterality: N/A;  830 - moved to 10:45 - Ginger to notify pt  . REVERSE SHOULDER ARTHROPLASTY Right 07/02/2018  . REVERSE SHOULDER ARTHROPLASTY Right 07/02/2018   Procedure: RIGHT REVERSE SHOULDER ARTHROPLASTY;  Surgeon: Cammy Copaean, Gregory Scott, MD;  [facility];  Service: Orthopedics;  Laterality: Right;  . right leg fracture surgery     right  . TUBAL LIGATION      There were no vitals filed for this visit.  Subjective Assessment - 08/09/18 1317    Subjective   S: I was really sore after that last session.    Currently in Pain?  Yes    Pain Score  2     Pain Location  Shoulder    Pain Orientation  Right    Pain Descriptors / Indicators  Aching;Sore    Pain Type  Acute pain    Pain Radiating Towards  radiates to bicep    Pain Onset  More than a month ago    Pain Frequency  Intermittent    Aggravating Factors   movement, increased use of RUE    Pain Relieving Factors  rest, pain medication    Effect of Pain on Daily Activities  mod/max effect on ADLs         [facility] - 08/09/18 1317      Assessment   Medical Diagnosis  right RSA      Precautions   Precautions  Shoulder    Type of Shoulder Precautions  OK for ROM and strengthening no greater than 2lbs.  OT Treatments/Exercises (OP) - 08/09/18 1318      Exercises   Exercises  Shoulder      Shoulder Exercises: Supine   Protraction  PROM;5 reps;AAROM;12 reps    Horizontal ABduction  PROM;5 reps;AAROM;12 reps    External Rotation  PROM;5 reps;AAROM;12 reps    Internal Rotation  PROM;5 reps;AAROM;12 reps    Flexion  PROM;5 reps;AAROM;12 reps    ABduction  PROM;5 reps;AAROM;12 reps      Shoulder Exercises: Standing   Protraction  AAROM;12 reps    Horizontal ABduction  AAROM;12 reps    External Rotation  AAROM;12 reps    Internal Rotation  AAROM;12 reps    Flexion  AAROM;12 reps    ABduction  AAROM;12 reps    Extension  Theraband;10 reps    Theraband Level (Shoulder Extension)  Level 2 (Red)    Row  Theraband;10 reps    Theraband Level (Shoulder Row)  Level 2 (Red)    Retraction  Theraband;10 reps    Theraband Level (Shoulder Retraction)  Level 2 (Red)      Shoulder Exercises: ROM/Strengthening   Wall Wash  1'    Proximal Shoulder  Strengthening, Supine  10X A/ROM no rest breaks    Proximal Shoulder Strengthening, Seated  10X A/ROM no rest breaks      Manual Therapy   Manual Therapy  Myofascial release;Other (comment)    Manual therapy comments  completed seperately than all other interventions this date    Myofascial Release  myofascial release and manual stretching to right upper arm, scapular and shoulder region to decrease pain and fascial restrictions and improve pain free mobility in right shoulder region.    Other Manual Therapy  Scar massage completed to healed incision to decrease scar tissue and allow for greater mobility during functional tasks.                OT Short Term Goals - 07/31/18 1459      OT SHORT TERM GOAL #1   Title  Patient will be educated and independent with HEP to facilitate progress in therapy and allow her to return to using her RUE as her dominant extremity.    Time  3    Period  Weeks    Status  On-going    Target Date  08/15/18      OT SHORT TERM GOAL #2   Title  Patient will increase P/ROM of her RUE to WNL in order to increase her ability to get her shirts on and off with less difficulty.    Time  3    Period  Weeks    Status  On-going        OT Long Term Goals - 07/31/18 1459      OT LONG TERM GOAL #1   Title  Patient will increase A/ROM of RUE to WNL in order to increase ability to reach over head and out to the side with less difficulty.    Time  6    Period  Weeks    Status  On-going      OT LONG TERM GOAL #2   Title  Patient will increase RUE strength to 4+/5 in order to return to gardening and normal household lifting tasks.    Time  6    Period  Weeks    Status  On-going      OT LONG TERM GOAL #3   Title  Patient will decrease fascial restrictions to min amount or less  in order to increase her functional mobility needed for reaching above shoulder level.    Time  6    Period  Weeks    Status  On-going      OT LONG TERM GOAL #4   Title  Patient  will report a decrease in pain level of approximately 3/10 or less when completing daily tasks with her RUE.    Time  6    Period  Weeks    Status  On-going            Plan - 08/09/18 1354    Clinical Impression Statement  A: Continued with manual therapy and scar massage to address fascial restrictions and scar restrictions limiting ROM. Continued with AA/ROM and added extension and retraction with red theraband. Pt with ROM WFL today, er continues to be the most limited. Verbal cuing for form and technique.    Body Structure / Function / Physical Skills  ADL;ROM;Fascial restriction;Mobility;Strength;Flexibility;UE functional use;Pain;Decreased knowledge of precautions    Plan  P: Attempt muscle energy technique for er; attempt A/ROM in supine, continue with AA/ROM in standing. Add pvc pipe slide       Patient will benefit from skilled therapeutic intervention in order to improve the following deficits and impairments:   Body Structure / Function / Physical Skills: ADL, ROM, Fascial restriction, Mobility, Strength, Flexibility, UE functional use, Pain, Decreased knowledge of precautions       Visit Diagnosis: 1. Other symptoms and signs involving the musculoskeletal system   2. Acute pain of right shoulder   3. Stiffness of right shoulder, not elsewhere classified       Problem List Patient Active Problem List   Diagnosis Date Noted  . 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 Colles' fracture of right radius 09/04/17 09/20/2017  . Pelvic pressure in female 05/11/2015  . Hematuria 05/11/2015  . Vaginal atrophy 05/11/2015  . Encounter for screening colonoscopy 10/08/2013  . Ankle arthritis 05/19/2013  . Left ankle strain 05/19/2013  . Left ankle pain 05/19/2013  . PRIMARY LOCALIZED OSTEOARTHROSIS LOWER LEG 04/15/2008  . BUCKET HANDLE TEAR OF LATERAL MENISCUS 04/15/2008  . TEAR LATERAL MENISCUS 04/15/2008  . KNEE, ARTHRITIS, DEGEN./OSTEO 01/08/2008  . DERANGEMENT MENISCUS  01/08/2008  . LOOSE BODY-KNEE 01/08/2008  . KNEE PAIN 01/08/2008   Guadelupe Sabin, OTR/L  [phone] 08/09/2018, 1:56 PM  [place] [address] [place], [place], [address] Phone: [phone]   Fax:  [phone]  Name: INGRI DIEMER MRN: [id number] Date of Birth: 1941-01-08

## 2018-08-13 ENCOUNTER — Encounter (HOSPITAL_COMMUNITY): Payer: Self-pay | Admitting: Occupational Therapy

## 2018-08-13 ENCOUNTER — Other Ambulatory Visit: Payer: Self-pay

## 2018-08-13 ENCOUNTER — Ambulatory Visit (HOSPITAL_COMMUNITY): Payer: PPO | Admitting: Occupational Therapy

## 2018-08-13 DIAGNOSIS — R29898 Other symptoms and signs involving the musculoskeletal system: Secondary | ICD-10-CM | POA: Diagnosis not present

## 2018-08-13 DIAGNOSIS — M25611 Stiffness of right shoulder, not elsewhere classified: Secondary | ICD-10-CM

## 2018-08-13 DIAGNOSIS — M25511 Pain in right shoulder: Secondary | ICD-10-CM

## 2018-08-13 NOTE — Patient Instructions (Signed)

## 2018-08-13 NOTE — Therapy (Signed)
[name] [address] [place], [place], [address] Phone: [phone]   Fax:  [phone]  Occupational Therapy Treatment  Patient Details  Name: Morgan BleakMary P Beard MRN: [id number] Date of Birth: 28-Oct-1940 Referring Provider (OT): Rise PaganiniGregory Dean, MD   Encounter Date: 08/13/2018  OT End of Session - 08/13/18 1459    Visit Number  6    Number of Visits  12    Date for OT Re-Evaluation  09/05/18    Authorization Type  [employer]    Authorization Time Period  $15 co pay. No visit limit.    OT Start Time  1412    OT Stop Time  1456    OT Time Calculation (min)  44 min    Activity Tolerance  Patient tolerated treatment well    Behavior During Therapy  WFL for tasks assessed/performed       Past Medical History:  Diagnosis Date  . B12 deficiency    managed by Dr. Ouida SillsFagan: monthly injections  . Hematuria 05/11/2015  . Hemorrhoids   . Hyperlipemia   . Impaired fasting glucose   . Neuropathy    hereditary and idiopathic  . Osteoarthritis   . Pelvic pressure in female 05/11/2015  . PONV (postoperative nausea and vomiting)   . Transient cerebral ischemic attack    no impairment  . Vaginal atrophy 05/11/2015    Past Surgical History:  Procedure Laterality Date  . ABDOMINAL HYSTERECTOMY    . ANKLE FRACTURE SURGERY     left  . APPENDECTOMY    . CATARACT EXTRACTION    . COLONOSCOPY  09/30/2003   RMR: Normal colon. Subtle abnormalities in the ileocecal valve and terminal  ileum as described above of doubtful clinical significance/Normal rectum  . COLONOSCOPY N/A 11/03/2013   Procedure: COLONOSCOPY;  Surgeon: Corbin Adeobert M Rourk, MD;  [facility];  Service: Endoscopy;  Laterality: N/A;  830 - moved to 10:45 - Ginger to notify pt  . REVERSE SHOULDER ARTHROPLASTY Right 07/02/2018  . REVERSE SHOULDER ARTHROPLASTY Right 07/02/2018   Procedure: RIGHT REVERSE SHOULDER ARTHROPLASTY;  Surgeon: Cammy Copaean, Gregory Scott, MD;  [facility];  Service: Orthopedics;  Laterality: Right;  . right leg fracture surgery     right  . TUBAL LIGATION      There were no vitals filed for this visit.  Subjective Assessment - 08/13/18 1409    Subjective   S: I got all those little sutures out.    Currently in Pain?  No/denies         [facility] Assessment - 08/13/18 1409      Assessment   Medical Diagnosis  right RSA      Precautions   Precautions  Shoulder    Type of Shoulder Precautions  OK for ROM and strengthening no greater than 2lbs.                OT Treatments/Exercises (OP) - 08/13/18 1409      Exercises   Exercises  Shoulder      Shoulder Exercises: Supine   Protraction  PROM;5 reps;AROM;10 reps    Horizontal ABduction  PROM;5 reps;AROM;10 reps    External Rotation  PROM;5 reps;AROM;10 reps    Internal Rotation  PROM;5 reps;AROM;10 reps    Flexion  PROM;5 reps;AROM;10 reps    ABduction  PROM;5 reps;AROM;10 reps      Shoulder Exercises: Standing   Protraction  AAROM;12 reps    Horizontal ABduction  AAROM;12 reps  External Rotation  AAROM;12 reps    Internal Rotation  AAROM;12 reps    Flexion  AAROM;12 reps    ABduction  AAROM;12 reps    Extension  Theraband;10 reps    Theraband Level (Shoulder Extension)  Level 2 (Red)    Row  Theraband;10 reps    Theraband Level (Shoulder Row)  Level 2 (Red)    Retraction  Theraband;10 reps    Theraband Level (Shoulder Retraction)  Level 2 (Red)    Other Standing Exercises  PVC pipe slide, 10X flexion      Shoulder Exercises: Therapy Ball   Right/Left  5 reps   each direction     Shoulder Exercises: ROM/Strengthening   Wall Wash  1'    Proximal Shoulder Strengthening, Supine  10X A/ROM no rest breaks    Proximal Shoulder Strengthening, Seated  10X A/ROM no rest breaks      Manual Therapy   Manual Therapy  Myofascial release;Other (comment)    Manual therapy comments  completed seperately than all other interventions this date    Myofascial Release   myofascial release and manual stretching to right upper arm, scapular and shoulder region to decrease pain and fascial restrictions and improve pain free mobility in right shoulder region.    Other Manual Therapy  Scar massage completed to healed incision to decrease scar tissue and allow for greater mobility during functional tasks.              OT Education - 08/13/18 1445    Education Details  red scapular theraband    Person(s) Educated  Patient    Methods  Explanation;Demonstration;Handout    Comprehension  Verbalized understanding;Returned demonstration       OT Short Term Goals - 07/31/18 1459      OT SHORT TERM GOAL #1   Title  Patient will be educated and independent with HEP to facilitate progress in therapy and allow her to return to using her RUE as her dominant extremity.    Time  3    Period  Weeks    Status  On-going    Target Date  08/15/18      OT SHORT TERM GOAL #2   Title  Patient will increase P/ROM of her RUE to WNL in order to increase her ability to get her shirts on and off with less difficulty.    Time  3    Period  Weeks    Status  On-going        OT Long Term Goals - 07/31/18 1459      OT LONG TERM GOAL #1   Title  Patient will increase A/ROM of RUE to WNL in order to increase ability to reach over head and out to the side with less difficulty.    Time  6    Period  Weeks    Status  On-going      OT LONG TERM GOAL #2   Title  Patient will increase RUE strength to 4+/5 in order to return to gardening and normal household lifting tasks.    Time  6    Period  Weeks    Status  On-going      OT LONG TERM GOAL #3   Title  Patient will decrease fascial restrictions to min amount or less in order to increase her functional mobility needed for reaching above shoulder level.    Time  6    Period  Weeks    Status  On-going  OT LONG TERM GOAL #4   Title  Patient will report a decrease in pain level of approximately 3/10 or less when  completing daily tasks with her RUE.    Time  6    Period  Weeks    Status  On-going            Plan - 08/13/18 1459    Clinical Impression Statement  A: Continued with manual therapy and scar massage to address fascial restrictions limiting ROM. Progressed to A/ROM supine and continued with AA/ROM in standing. Updated HEP for scapular theraband, added pvc slide as well as ball circles to begin addressing IR. Verbal cuing for form and technique.    Body Structure / Function / Physical Skills  ADL;ROM;Fascial restriction;Mobility;Strength;Flexibility;UE functional use;Pain;Decreased knowledge of precautions    Plan  P: Follow up on HEP. Attempt to progress to A/ROM in standing. Continue working to improve IR required for functional tasks such as dressing       Patient will benefit from skilled therapeutic intervention in order to improve the following deficits and impairments:   Body Structure / Function / Physical Skills: ADL, ROM, Fascial restriction, Mobility, Strength, Flexibility, UE functional use, Pain, Decreased knowledge of precautions       Visit Diagnosis: 1. Other symptoms and signs involving the musculoskeletal system   2. Acute pain of right shoulder   3. Stiffness of right shoulder, not elsewhere classified       Problem List Patient Active Problem List   Diagnosis Date Noted  . 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 Colles' fracture of right radius 09/04/17 09/20/2017  . Pelvic pressure in female 05/11/2015  . Hematuria 05/11/2015  . Vaginal atrophy 05/11/2015  . Encounter for screening colonoscopy 10/08/2013  . Ankle arthritis 05/19/2013  . Left ankle strain 05/19/2013  . Left ankle pain 05/19/2013  . PRIMARY LOCALIZED OSTEOARTHROSIS LOWER LEG 04/15/2008  . BUCKET HANDLE TEAR OF LATERAL MENISCUS 04/15/2008  . TEAR LATERAL MENISCUS 04/15/2008  . KNEE, ARTHRITIS, DEGEN./OSTEO 01/08/2008  . DERANGEMENT MENISCUS 01/08/2008  . LOOSE BODY-KNEE 01/08/2008  . KNEE  PAIN 01/08/2008   Guadelupe Sabin, OTR/L  [phone] 08/13/2018, 3:01 PM  [facility] [address] [place], [place], [address] Phone: [phone]   Fax:  [phone]  Name: [name] MRN: [id number] Date of Birth: 07/27/1940

## 2018-08-14 ENCOUNTER — Ambulatory Visit (INDEPENDENT_AMBULATORY_CARE_PROVIDER_SITE_OTHER): Payer: PPO | Admitting: Orthopedic Surgery

## 2018-08-14 ENCOUNTER — Encounter: Payer: Self-pay | Admitting: Orthopedic Surgery

## 2018-08-14 DIAGNOSIS — M19011 Primary osteoarthritis, right shoulder: Secondary | ICD-10-CM

## 2018-08-14 MED ORDER — AMOXICILLIN 500 MG PO TABS: 2000.0000 mg | ORAL_TABLET | Freq: Once | ORAL | 0 refills | Status: AC

## 2018-08-14 NOTE — Progress Notes (Signed)
Post-Op Visit Note   Patient: Leitha BleakMary P Ucci           Date of Birth: July 30, 1940           MRN: [id number] Visit Date: 08/14/2018 PCP: Carylon PerchesFagan, Roy, MD   Assessment & Plan:  Chief Complaint:  Chief Complaint  Patient presents with  . Right Shoulder - Follow-up   Visit Diagnoses: No diagnosis found.  Plan: Pt returns to the office s/p Rt RSA on 07/02/18.  Pt is doing very well.  Her motion is great and she is very satisfied.  Pain continues to improve and she is only left with some soreness at this point.  Pt is keeping up with PT 2x a week, where they are now working on strengthening.  She has good strength in clinic today and her incision is well-healed.  She will continue her PT until she runs out of visits in August.  Discussed a 15lb weight restriction when lifting with the R arm.  Pt understands.  Pt will f/u with the office prn.  Prescribed Amoxicillin 2g to be taken 1 hour before her dental cleaning in one week.   Follow-Up Instructions: No follow-ups on file.   Orders:  No orders of the defined types were placed in this encounter.  Meds ordered this encounter  Medications  . amoxicillin (AMOXIL) 500 MG tablet    Sig: Take 4 tablets (2,000 mg total) by mouth once for 1 dose. Take 2g, 1 hour before dental cleaning    Dispense:  4 tablet    Refill:  0    Imaging: No results found.  PMFS History: Patient Active Problem List   Diagnosis Date Noted  . 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 Colles' fracture of right radius 09/04/17 09/20/2017  . Pelvic pressure in female 05/11/2015  . Hematuria 05/11/2015  . Vaginal atrophy 05/11/2015  . Encounter for screening colonoscopy 10/08/2013  . Ankle arthritis 05/19/2013  . Left ankle strain 05/19/2013  . Left ankle pain 05/19/2013  . PRIMARY LOCALIZED OSTEOARTHROSIS LOWER LEG 04/15/2008  . BUCKET HANDLE TEAR OF LATERAL MENISCUS 04/15/2008  . TEAR LATERAL MENISCUS 04/15/2008  . KNEE, ARTHRITIS, DEGEN./OSTEO 01/08/2008  .  DERANGEMENT MENISCUS 01/08/2008  . LOOSE BODY-KNEE 01/08/2008  . KNEE PAIN 01/08/2008   Past Medical History:  Diagnosis Date  . B12 deficiency    managed by Dr. Ouida SillsFagan: monthly injections  . Hematuria 05/11/2015  . Hemorrhoids   . Hyperlipemia   . Impaired fasting glucose   . Neuropathy    hereditary and idiopathic  . Osteoarthritis   . Pelvic pressure in female 05/11/2015  . PONV (postoperative nausea and vomiting)   . Transient cerebral ischemic attack    no impairment  . Vaginal atrophy 05/11/2015    Family History  Problem Relation Age of Onset  . Heart disease Daughter        transposition of great arteries/on fourth pacemaker  . Heart disease Mother   . Glaucoma Mother   . Arthritis Father        rheumatoid  . Alcohol abuse Father   . Diabetes Father   . Glaucoma Sister   . Arthritis Brother        rheumatoid  . Colon cancer Neg Hx   . Colon polyps Neg Hx   . Breast cancer Neg Hx   . Lung cancer Neg Hx     Past Surgical History:  Procedure Laterality Date  . ABDOMINAL HYSTERECTOMY    . ANKLE FRACTURE  SURGERY     left  . APPENDECTOMY    . CATARACT EXTRACTION    . COLONOSCOPY  09/30/2003   RMR: Normal colon. Subtle abnormalities in the ileocecal valve and terminal  ileum as described above of doubtful clinical significance/Normal rectum  . COLONOSCOPY N/A 11/03/2013   Procedure: COLONOSCOPY;  Surgeon: Daneil Dolin, MD;  [facility];  Service: Endoscopy;  Laterality: N/A;  830 - moved to 10:45 - Ginger to notify pt  . REVERSE SHOULDER ARTHROPLASTY Right 07/02/2018  . REVERSE SHOULDER ARTHROPLASTY Right 07/02/2018   Procedure: RIGHT REVERSE SHOULDER ARTHROPLASTY;  Surgeon: Meredith Pel, MD;  Location: [facility];  Service: Orthopedics;  Laterality: Right;  . right leg fracture surgery     right  . TUBAL LIGATION     Social History   Occupational History  . Not on file  Tobacco Use  . Smoking status: Never Smoker  . Smokeless tobacco: Never  Used  Substance and Sexual Activity  . Alcohol use: No  . Drug use: No  . Sexual activity: Not Currently    Birth control/protection: Surgical    Comment: hyst

## 2018-08-15 ENCOUNTER — Other Ambulatory Visit: Payer: Self-pay

## 2018-08-15 ENCOUNTER — Ambulatory Visit (HOSPITAL_COMMUNITY): Payer: PPO | Admitting: Occupational Therapy

## 2018-08-15 ENCOUNTER — Encounter (HOSPITAL_COMMUNITY): Payer: Self-pay | Admitting: Occupational Therapy

## 2018-08-15 DIAGNOSIS — R29898 Other symptoms and signs involving the musculoskeletal system: Secondary | ICD-10-CM

## 2018-08-15 DIAGNOSIS — M25611 Stiffness of right shoulder, not elsewhere classified: Secondary | ICD-10-CM

## 2018-08-15 DIAGNOSIS — M25511 Pain in right shoulder: Secondary | ICD-10-CM

## 2018-08-15 NOTE — Patient Instructions (Signed)

## 2018-08-15 NOTE — Therapy (Signed)
[place], [place], [address] Phone: [phone]   Fax:  [phone]  Occupational Therapy Treatment  Patient Details  Name: [name] MRN: [id number] Date of Birth: [birth year] Referring Provider (OT): Marcene Duos, MD   Encounter Date: 08/15/2018  OT End of Session - 08/15/18 1600    Visit Number  7    Number of Visits  12    Date for OT Re-Evaluation  09/05/18    Authorization Type  [employer]    Authorization Time Period  $15 co pay. No visit limit.    OT Start Time  1515    OT Stop Time  1559    OT Time Calculation (min)  44 min    Activity Tolerance  Patient tolerated treatment well    Behavior During Therapy  WFL for tasks assessed/performed       Past Medical History:  Diagnosis Date  . B12 deficiency    managed by Dr. Willey Blade: monthly injections  . Hematuria 05/11/2015  . Hemorrhoids   . Hyperlipemia   . Impaired fasting glucose   . Neuropathy    hereditary and idiopathic  . Osteoarthritis   . Pelvic pressure in female 05/11/2015  . PONV (postoperative nausea and vomiting)   . Transient cerebral ischemic attack    no impairment  . Vaginal atrophy 05/11/2015    Past Surgical History:  Procedure Laterality Date  . ABDOMINAL HYSTERECTOMY    . ANKLE FRACTURE SURGERY     left  . APPENDECTOMY    . CATARACT EXTRACTION    . COLONOSCOPY  09/30/2003   RMR: Normal colon. Subtle abnormalities in the ileocecal valve and terminal  ileum as described above of doubtful clinical significance/Normal rectum  . COLONOSCOPY N/A 11/03/2013   Procedure: COLONOSCOPY;  Surgeon: Daneil Dolin, MD;  [facility];  Service: Endoscopy;  Laterality: N/A;  830 - moved to 10:45 - Ginger to notify pt  . REVERSE SHOULDER ARTHROPLASTY Right 07/02/2018  . REVERSE SHOULDER ARTHROPLASTY Right 07/02/2018   Procedure: RIGHT REVERSE SHOULDER ARTHROPLASTY;  Surgeon: Meredith Pel, MD;  Location: [facility];  Service: Orthopedics;  Laterality: Right;  . right leg fracture surgery     right  . TUBAL LIGATION      There were no vitals filed for this visit.  Subjective Assessment - 08/15/18 1514    Subjective   S: The doctor was just tickled with my progress.    Currently in Pain?  Yes    Pain Score  2     Pain Location  Shoulder    Pain Descriptors / Indicators  Sore    Pain Type  Acute pain    Pain Radiating Towards  n/a    Pain Onset  More than a month ago    Pain Frequency  Intermittent    Aggravating Factors   movement, increased use of RUE    Pain Relieving Factors  rest    Effect of Pain on Daily Activities  min effect on ADLs    Multiple Pain Sites  No         [facility] OT Assessment - 08/15/18 1514      Assessment   Medical Diagnosis  right RSA      Precautions   Precautions  Shoulder    Type of Shoulder Precautions  OK for ROM and strengthening no greater than 2lbs.  OT Treatments/Exercises (OP) - 08/15/18 1519      Exercises   Exercises  Shoulder      Shoulder Exercises: Supine   Protraction  PROM;5 reps;AROM;12 reps    Horizontal ABduction  PROM;5 reps;AROM;12 reps    External Rotation  PROM;5 reps;AROM;12 reps    Internal Rotation  PROM;5 reps;AROM;12 reps    Flexion  PROM;5 reps;AROM;12 reps    ABduction  PROM;5 reps;AROM;12 reps      Shoulder Exercises: Standing   Protraction  AROM;10 reps    Horizontal ABduction  AROM;10 reps    External Rotation  AROM;10 reps    Internal Rotation  AROM;10 reps    Flexion  AROM;10 reps    ABduction  AROM;10 reps    Extension  Theraband;10 reps    Theraband Level (Shoulder Extension)  Level 2 (Red)    Row  Theraband;10 reps    Theraband Level (Shoulder Row)  Level 2 (Red)    Retraction  Theraband;10 reps    Theraband Level (Shoulder Retraction)  Level 2 (Red)    Other Standing Exercises  PVC pipe slide, 10X flexion      Shoulder Exercises: Therapy Ball   Right/Left  5 reps   each  direction     Shoulder Exercises: ROM/Strengthening   UBE (Upper Arm Bike)  Level 1 2' forward 2' reverse   pace: 4.0-5.0   Proximal Shoulder Strengthening, Supine  12X A/ROM no rest breaks    Proximal Shoulder Strengthening, Seated  12X A/ROM no rest breaks      Manual Therapy   Manual Therapy  Myofascial release;Other (comment)    Manual therapy comments  completed seperately than all other interventions this date    Myofascial Release  myofascial release and manual stretching to right upper arm, scapular and shoulder region to decrease pain and fascial restrictions and improve pain free mobility in right shoulder region.    Other Manual Therapy  Scar massage completed to healed incision to decrease scar tissue and allow for greater mobility during functional tasks.              OT Education - 08/15/18 1544    Education Details  shoulder A/ROM    Person(s) Educated  Patient    Methods  Explanation;Demonstration;Handout    Comprehension  Verbalized understanding;Returned demonstration       OT Short Term Goals - 07/31/18 1459      OT SHORT TERM GOAL #1   Title  Patient will be educated and independent with HEP to facilitate progress in therapy and allow her to return to using her RUE as her dominant extremity.    Time  3    Period  Weeks    Status  On-going    Target Date  08/15/18      OT SHORT TERM GOAL #2   Title  Patient will increase P/ROM of her RUE to WNL in order to increase her ability to get her shirts on and off with less difficulty.    Time  3    Period  Weeks    Status  On-going        OT Long Term Goals - 07/31/18 1459      OT LONG TERM GOAL #1   Title  Patient will increase A/ROM of RUE to WNL in order to increase ability to reach over head and out to the side with less difficulty.    Time  6    Period  Weeks  Status  On-going      OT LONG TERM GOAL #2   Title  Patient will increase RUE strength to 4+/5 in order to return to gardening and  normal household lifting tasks.    Time  6    Period  Weeks    Status  On-going      OT LONG TERM GOAL #3   Title  Patient will decrease fascial restrictions to min amount or less in order to increase her functional mobility needed for reaching above shoulder level.    Time  6    Period  Weeks    Status  On-going      OT LONG TERM GOAL #4   Title  Patient will report a decrease in pain level of approximately 3/10 or less when completing daily tasks with her RUE.    Time  6    Period  Weeks    Status  On-going            Plan - 08/15/18 1601    Clinical Impression Statement  A: Pt reports MD is pleased with her progress and has released her with a 15# lifting restriction. Continued with manual therapy and scar massage today. Progressed to all A/ROM, added UBE, and continued working on IR with ball circles. Verbal cuing for form and technique.    Body Structure / Function / Physical Skills  ADL;ROM;Fascial restriction;Mobility;Strength;Flexibility;UE functional use;Pain;Decreased knowledge of precautions    Plan  P: Follow up on HEP, add x to v arms and proximal shoulder strengthening on doorway. Continue working on [employer] for functional tasks       Patient will benefit from skilled therapeutic intervention in order to improve the following deficits and impairments:   Body Structure / Function / Physical Skills: ADL, ROM, Fascial restriction, Mobility, Strength, Flexibility, UE functional use, Pain, Decreased knowledge of precautions       Visit Diagnosis: 1. Other symptoms and signs involving the musculoskeletal system   2. Acute pain of right shoulder   3. Stiffness of right shoulder, not elsewhere classified       Problem List Patient Active Problem List   Diagnosis Date Noted  . 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 Colles' fracture of right radius 09/04/17 09/20/2017  . Pelvic pressure in female 05/11/2015  . Hematuria 05/11/2015  . Vaginal atrophy 05/11/2015  .  Encounter for screening colonoscopy 10/08/2013  . Ankle arthritis 05/19/2013  . Left ankle strain 05/19/2013  . Left ankle pain 05/19/2013  . PRIMARY LOCALIZED OSTEOARTHROSIS LOWER LEG 04/15/2008  . BUCKET HANDLE TEAR OF LATERAL MENISCUS 04/15/2008  . TEAR LATERAL MENISCUS 04/15/2008  . KNEE, ARTHRITIS, DEGEN./OSTEO 01/08/2008  . DERANGEMENT MENISCUS 01/08/2008  . LOOSE BODY-KNEE 01/08/2008  . KNEE PAIN 01/08/2008   Ezra SitesLeslie Tava Peery, OTR/L  [phone] 08/15/2018, 4:02 PM   [facility] [address] [place], [place], [address] Phone: [phone]   Fax:  [phone]  Name: Leitha BleakMary P Beard MRN: [id number] Date of Birth: [birth year]

## 2018-08-20 ENCOUNTER — Ambulatory Visit (HOSPITAL_COMMUNITY): Payer: PPO

## 2018-08-20 ENCOUNTER — Other Ambulatory Visit: Payer: Self-pay

## 2018-08-20 ENCOUNTER — Encounter (HOSPITAL_COMMUNITY): Payer: Self-pay

## 2018-08-20 DIAGNOSIS — R29898 Other symptoms and signs involving the musculoskeletal system: Secondary | ICD-10-CM

## 2018-08-20 DIAGNOSIS — M25611 Stiffness of right shoulder, not elsewhere classified: Secondary | ICD-10-CM

## 2018-08-20 DIAGNOSIS — M25511 Pain in right shoulder: Secondary | ICD-10-CM

## 2018-08-20 NOTE — Therapy (Signed)
[name] [address] [place], [place], [address] Phone: [phone]   Fax:  [phone]  Occupational Therapy Treatment  Patient Details  Name: Morgan BleakMary P Beard MRN: [id number] Date of Birth: 1940-04-14 Referring Provider (OT): Rise PaganiniGregory Dean, MD   Encounter Date: 08/20/2018  OT End of Session - 08/20/18 1636    Visit Number  8    Number of Visits  12    Date for OT Re-Evaluation  09/05/18    Authorization Type  [employer]    Authorization Time Period  $15 co pay. No visit limit.    OT Start Time  1529    OT Stop Time  1613    OT Time Calculation (min)  44 min    Activity Tolerance  Patient tolerated treatment well    Behavior During Therapy  WFL for tasks assessed/performed       Past Medical History:  Diagnosis Date  . B12 deficiency    managed by Dr. Ouida SillsFagan: monthly injections  . Hematuria 05/11/2015  . Hemorrhoids   . Hyperlipemia   . Impaired fasting glucose   . Neuropathy    hereditary and idiopathic  . Osteoarthritis   . Pelvic pressure in female 05/11/2015  . PONV (postoperative nausea and vomiting)   . Transient cerebral ischemic attack    no impairment  . Vaginal atrophy 05/11/2015    Past Surgical History:  Procedure Laterality Date  . ABDOMINAL HYSTERECTOMY    . ANKLE FRACTURE SURGERY     left  . APPENDECTOMY    . CATARACT EXTRACTION    . COLONOSCOPY  09/30/2003   RMR: Normal colon. Subtle abnormalities in the ileocecal valve and terminal  ileum as described above of doubtful clinical significance/Normal rectum  . COLONOSCOPY N/A 11/03/2013   Procedure: COLONOSCOPY;  Surgeon: Corbin Adeobert M Rourk, MD;  [facility];  Service: Endoscopy;  Laterality: N/A;  830 - moved to 10:45 - Ginger to notify pt  . REVERSE SHOULDER ARTHROPLASTY Right 07/02/2018  . REVERSE SHOULDER ARTHROPLASTY Right 07/02/2018   Procedure: RIGHT REVERSE SHOULDER ARTHROPLASTY;  Surgeon: Cammy Copaean, Gregory Scott, MD;  [facility];  Service: Orthopedics;  Laterality: Right;  . right leg fracture surgery     right  . TUBAL LIGATION      There were no vitals filed for this visit.  Subjective Assessment - 08/20/18 1541    Subjective   S: it's sore under my arm when I do this (external rotation) it's worse.    Currently in Pain?  Yes    Pain Score  7     Pain Location  Arm    Pain Orientation  Right    Pain Descriptors / Indicators  Sore    Pain Type  Acute pain    Pain Radiating Towards  N/A    Pain Onset  Yesterday    Pain Frequency  Constant    Aggravating Factors   movement (external rotation)    Pain Relieving Factors  rest    Effect of Pain on Daily Activities  min effect    Multiple Pain Sites  No         [facility] OT Assessment - 08/20/18 1542      Assessment   Medical Diagnosis  right RSA      Precautions   Precautions  Shoulder    Type of Shoulder Precautions  OK for ROM and strengthening no greater than 15lbs.     Shoulder Interventions  --  OT Treatments/Exercises (OP) - 08/20/18 1542      Exercises   Exercises  Shoulder      Shoulder Exercises: Supine   Protraction  PROM;5 reps;AROM;15 reps    Horizontal ABduction  PROM;5 reps;AROM;15 reps    External Rotation  PROM;5 reps;AROM;15 reps    Internal Rotation  PROM;5 reps;AROM;15 reps    Flexion  PROM;5 reps;AROM;15 reps    ABduction  PROM;5 reps;AROM;15 reps      Shoulder Exercises: Standing   Protraction  AROM;15 reps    Horizontal ABduction  AROM;15 reps    External Rotation  AROM;15 reps    Internal Rotation  AROM;15 reps    Flexion  AROM;15 reps    ABduction  AROM;15 reps    Extension  Theraband;12 reps    Theraband Level (Shoulder Extension)  Level 2 (Red)    Row  Theraband;12 reps    Theraband Level (Shoulder Row)  Level 2 (Red)    Retraction  Theraband;12 reps    Theraband Level (Shoulder Retraction)  Level 2 (Red)      Shoulder Exercises: Therapy Ball   Right/Left  5 reps      Shoulder  Exercises: ROM/Strengthening   UBE (Upper Arm Bike)  Level 1 2' forward 2' reverse   pace: 4.0-5.0   X to V Arms  10X A/ROM    Proximal Shoulder Strengthening, Supine  15X A/ROM no rest breaks    Proximal Shoulder Strengthening, Seated  12X A/ROM no rest breaks    Other ROM/Strengthening Exercises  Y arms lift off; 10X    Other ROM/Strengthening Exercises  Proximal shoulder strengthening at door with washcloth; 1' flexion      Manual Therapy   Manual Therapy  Myofascial release    Manual therapy comments  completed seperately than all other interventions this date    Myofascial Release  myofascial release and manual stretching to right upper arm, scapular and shoulder region to decrease pain and fascial restrictions and improve pain free mobility in right shoulder region.               OT Short Term Goals - 07/31/18 1459      OT SHORT TERM GOAL #1   Title  Patient will be educated and independent with HEP to facilitate progress in therapy and allow her to return to using her RUE as her dominant extremity.    Time  3    Period  Weeks    Status  On-going    Target Date  08/15/18      OT SHORT TERM GOAL #2   Title  Patient will increase P/ROM of her RUE to WNL in order to increase her ability to get her shirts on and off with less difficulty.    Time  3    Period  Weeks    Status  On-going        OT Long Term Goals - 07/31/18 1459      OT LONG TERM GOAL #1   Title  Patient will increase A/ROM of RUE to WNL in order to increase ability to reach over head and out to the side with less difficulty.    Time  6    Period  Weeks    Status  On-going      OT LONG TERM GOAL #2   Title  Patient will increase RUE strength to 4+/5 in order to return to gardening and normal household lifting tasks.    Time  6  Period  Weeks    Status  On-going      OT LONG TERM GOAL #3   Title  Patient will decrease fascial restrictions to min amount or less in order to increase her  functional mobility needed for reaching above shoulder level.    Time  6    Period  Weeks    Status  On-going      OT LONG TERM GOAL #4   Title  Patient will report a decrease in pain level of approximately 3/10 or less when completing daily tasks with her RUE.    Time  6    Period  Weeks    Status  On-going            Plan - 08/20/18 1637    Clinical Impression Statement  A: Progressed to 15 repetitions for A/ROM supine and standing. Pt reported more arm soreness since last session when completing external rotation motion. No increased fascial restrictions noted during manual techniques. VC for form and technique were provided. Added proximal shoulder strengthening on wall and X to V arms to continue working on shoulder strengthening and ROM.    Body Structure / Function / Physical Skills  ADL;ROM;Fascial restriction;Mobility;Strength;Flexibility;UE functional use;Pain;Decreased knowledge of precautions    Plan  P: Add overhead lacing and functional reaching task in cabinet.    Consulted and Agree with Plan of Care  Patient       Patient will benefit from skilled therapeutic intervention in order to improve the following deficits and impairments:   Body Structure / Function / Physical Skills: ADL, ROM, Fascial restriction, Mobility, Strength, Flexibility, UE functional use, Pain, Decreased knowledge of precautions       Visit Diagnosis: 1. Stiffness of right shoulder, not elsewhere classified   2. Acute pain of right shoulder   3. Other symptoms and signs involving the musculoskeletal system       Problem List Patient Active Problem List   Diagnosis Date Noted  . 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 Colles' fracture of right radius 09/04/17 09/20/2017  . Pelvic pressure in female 05/11/2015  . Hematuria 05/11/2015  . Vaginal atrophy 05/11/2015  . Encounter for screening colonoscopy 10/08/2013  . Ankle arthritis 05/19/2013  . Left ankle strain 05/19/2013  . Left ankle  pain 05/19/2013  . PRIMARY LOCALIZED OSTEOARTHROSIS LOWER LEG 04/15/2008  . BUCKET HANDLE TEAR OF LATERAL MENISCUS 04/15/2008  . TEAR LATERAL MENISCUS 04/15/2008  . KNEE, ARTHRITIS, DEGEN./OSTEO 01/08/2008  . DERANGEMENT MENISCUS 01/08/2008  . LOOSE BODY-KNEE 01/08/2008  . KNEE PAIN 01/08/2008   Ailene Ravel, OTR/L,CBIS  [phone]  08/20/2018, 4:39 PM  [facility] [address] [place], [place], [address] Phone: [phone]   Fax:  [phone]  Name: AERIE DONICA MRN: [id number] Date of Birth: [birth year]

## 2018-08-22 ENCOUNTER — Encounter (HOSPITAL_COMMUNITY): Payer: Self-pay

## 2018-08-22 ENCOUNTER — Ambulatory Visit (HOSPITAL_COMMUNITY): Payer: PPO

## 2018-08-22 ENCOUNTER — Other Ambulatory Visit: Payer: Self-pay

## 2018-08-22 DIAGNOSIS — R29898 Other symptoms and signs involving the musculoskeletal system: Secondary | ICD-10-CM

## 2018-08-22 DIAGNOSIS — M25611 Stiffness of right shoulder, not elsewhere classified: Secondary | ICD-10-CM

## 2018-08-22 DIAGNOSIS — M25511 Pain in right shoulder: Secondary | ICD-10-CM

## 2018-08-22 NOTE — Therapy (Signed)
[place], [place], [address] Phone: [phone]   Fax:  [phone]  Occupational Therapy Treatment  Patient Details  Name: [name] MRN: [id number] Date of Birth: 04/06/1940 Referring Provider (OT): Marcene Duos, MD   Encounter Date: 08/22/2018  OT End of Session - 08/22/18 1627    Visit Number  9    Number of Visits  12    Date for OT Re-Evaluation  09/05/18    Authorization Type  [employer]    Authorization Time Period  $15 co pay. No visit limit.    OT Start Time  1540    OT Stop Time  1618    OT Time Calculation (min)  38 min    Activity Tolerance  Patient tolerated treatment well    Behavior During Therapy  WFL for tasks assessed/performed       Past Medical History:  Diagnosis Date  . B12 deficiency    managed by Dr. Willey Blade: monthly injections  . Hematuria 05/11/2015  . Hemorrhoids   . Hyperlipemia   . Impaired fasting glucose   . Neuropathy    hereditary and idiopathic  . Osteoarthritis   . Pelvic pressure in female 05/11/2015  . PONV (postoperative nausea and vomiting)   . Transient cerebral ischemic attack    no impairment  . Vaginal atrophy 05/11/2015    Past Surgical History:  Procedure Laterality Date  . ABDOMINAL HYSTERECTOMY    . ANKLE FRACTURE SURGERY     left  . APPENDECTOMY    . CATARACT EXTRACTION    . COLONOSCOPY  09/30/2003   RMR: Normal colon. Subtle abnormalities in the ileocecal valve and terminal  ileum as described above of doubtful clinical significance/Normal rectum  . COLONOSCOPY N/A 11/03/2013   Procedure: COLONOSCOPY;  Surgeon: Daneil Dolin, MD;  [facility];  Service: Endoscopy;  Laterality: N/A;  830 - moved to 10:45 - Ginger to notify pt  . REVERSE SHOULDER ARTHROPLASTY Right 07/02/2018  . REVERSE SHOULDER ARTHROPLASTY Right 07/02/2018   Procedure: RIGHT REVERSE SHOULDER ARTHROPLASTY;  Surgeon: Meredith Pel, MD;  Location: [facility];  Service: Orthopedics;  Laterality: Right;  . right leg fracture surgery     right  . TUBAL LIGATION      There were no vitals filed for this visit.  Subjective Assessment - 08/22/18 1553    Subjective   S: It's not hurting as bad as last time.    Currently in Pain?  Yes    Pain Score  4     Pain Location  Arm    Pain Orientation  Right    Pain Descriptors / Indicators  Sore    Pain Type  Acute pain         [facility] OT Assessment - 08/22/18 1553      Assessment   Medical Diagnosis  right RSA      Precautions   Precautions  Shoulder    Type of Shoulder Precautions  OK for ROM and strengthening no greater than 15lbs.                OT Treatments/Exercises (OP) - 08/22/18 1554      Exercises   Exercises  Shoulder      Shoulder Exercises: Supine   Protraction  PROM;5 reps;AROM;15 reps    Horizontal ABduction  PROM;5 reps;AROM;15 reps    External Rotation  PROM;5 reps;AROM;15 reps    Internal Rotation  PROM;5  reps;AROM;15 reps    Flexion  PROM;5 reps;AROM;15 reps    ABduction  PROM;5 reps;AROM;15 reps      Shoulder Exercises: Standing   Protraction  AROM;15 reps    Horizontal ABduction  AROM;15 reps    External Rotation  AROM;15 reps    Internal Rotation  AROM;15 reps    Flexion  AROM;15 reps    ABduction  AROM;15 reps      Shoulder Exercises: ROM/Strengthening   UBE (Upper Arm Bike)  Level 1 2' forward 2' reverse   pace: 6.0-7.0   Over Head Lace  2' seated    X to V Arms  10X A/ROM    Proximal Shoulder Strengthening, Supine  15X A/ROM no rest breaks    Proximal Shoulder Strengthening, Seated  15X A/ROM no rest breaks    Other ROM/Strengthening Exercises  Y arms lift off; 10X      Functional Reaching Activities   High Level  Pt completed functional reaching task while placing 10 cones onto highest shelf with min difficulty. Patient then removed cones also.       Manual Therapy   Manual Therapy  Myofascial release    Manual therapy comments   completed seperately than all other interventions this date               OT Short Term Goals - 07/31/18 1459      OT SHORT TERM GOAL #1   Title  Patient will be educated and independent with HEP to facilitate progress in therapy and allow her to return to using her RUE as her dominant extremity.    Time  3    Period  Weeks    Status  On-going    Target Date  08/15/18      OT SHORT TERM GOAL #2   Title  Patient will increase P/ROM of her RUE to WNL in order to increase her ability to get her shirts on and off with less difficulty.    Time  3    Period  Weeks    Status  On-going        OT Long Term Goals - 07/31/18 1459      OT LONG TERM GOAL #1   Title  Patient will increase A/ROM of RUE to WNL in order to increase ability to reach over head and out to the side with less difficulty.    Time  6    Period  Weeks    Status  On-going      OT LONG TERM GOAL #2   Title  Patient will increase RUE strength to 4+/5 in order to return to gardening and normal household lifting tasks.    Time  6    Period  Weeks    Status  On-going      OT LONG TERM GOAL #3   Title  Patient will decrease fascial restrictions to min amount or less in order to increase her functional mobility needed for reaching above shoulder level.    Time  6    Period  Weeks    Status  On-going      OT LONG TERM GOAL #4   Title  Patient will report a decrease in pain level of approximately 3/10 or less when completing daily tasks with her RUE.    Time  6    Period  Weeks    Status  On-going            Plan - 08/22/18  1628    Clinical Impression Statement  A: Completed functional reaching tasks during session to increase shoulder stability and strength. Pt reports muscle fatigue during reaching tasks although able to complete without rest breaks. VC for form and technique were provided during session.    Body Structure / Function / Physical Skills  ADL;ROM;Fascial  restriction;Mobility;Strength;Flexibility;UE functional use;Pain;Decreased knowledge of precautions    Plan  P: Continue with scapular strengthening. Add ball on the wall. Therapy ball strengthening if able to complete. Ball circles.       Patient will benefit from skilled therapeutic intervention in order to improve the following deficits and impairments:   Body Structure / Function / Physical Skills: ADL, ROM, Fascial restriction, Mobility, Strength, Flexibility, UE functional use, Pain, Decreased knowledge of precautions       Visit Diagnosis: 1. Acute pain of right shoulder   2. Stiffness of right shoulder, not elsewhere classified   3. Other symptoms and signs involving the musculoskeletal system       Problem List Patient Active Problem List   Diagnosis Date Noted  . 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 Colles' fracture of right radius 09/04/17 09/20/2017  . Pelvic pressure in female 05/11/2015  . Hematuria 05/11/2015  . Vaginal atrophy 05/11/2015  . Encounter for screening colonoscopy 10/08/2013  . Ankle arthritis 05/19/2013  . Left ankle strain 05/19/2013  . Left ankle pain 05/19/2013  . PRIMARY LOCALIZED OSTEOARTHROSIS LOWER LEG 04/15/2008  . BUCKET HANDLE TEAR OF LATERAL MENISCUS 04/15/2008  . TEAR LATERAL MENISCUS 04/15/2008  . KNEE, ARTHRITIS, DEGEN./OSTEO 01/08/2008  . DERANGEMENT MENISCUS 01/08/2008  . LOOSE BODY-KNEE 01/08/2008  . KNEE PAIN 01/08/2008   Limmie PatriciaLaura Makynleigh Breslin, OTR/L,CBIS  [phone]  08/22/2018, 4:37 PM  [facility] [address] [place], [place], [address] Phone: [phone]   Fax:  [phone]  Name: Leitha BleakMary P Ta MRN: [id number] Date of Birth: Mar 31, 1940

## 2018-08-26 ENCOUNTER — Telehealth: Payer: Self-pay

## 2018-08-26 NOTE — Telephone Encounter (Signed)
Patient's dentist office called stating that they would need an order or letter stating that patient would need pre-dental antibiotics forever.  Cb# is [phone].  Please advise.  Thank you.

## 2018-08-27 ENCOUNTER — Other Ambulatory Visit: Payer: Self-pay

## 2018-08-27 ENCOUNTER — Encounter (HOSPITAL_COMMUNITY): Payer: Self-pay

## 2018-08-27 ENCOUNTER — Ambulatory Visit (HOSPITAL_COMMUNITY): Payer: PPO | Attending: Orthopedic Surgery

## 2018-08-27 DIAGNOSIS — M25611 Stiffness of right shoulder, not elsewhere classified: Secondary | ICD-10-CM

## 2018-08-27 DIAGNOSIS — R29898 Other symptoms and signs involving the musculoskeletal system: Secondary | ICD-10-CM | POA: Diagnosis not present

## 2018-08-27 DIAGNOSIS — M25511 Pain in right shoulder: Secondary | ICD-10-CM | POA: Diagnosis not present

## 2018-08-27 NOTE — Therapy (Signed)
[facility] [address] [place], [place], [address] Phone: [phone]   Fax:  [phone]  Occupational Therapy Treatment  Patient Details  Name: [name] MRN: [id number] Date of Birth: July 30, 1940 Referring Provider (OT): Rise PaganiniGregory Dean, MD   Progress Note Reporting Period 07/25/2018 to 08/27/2018  See note below for Objective Data and Assessment of Progress/Goals.       Encounter Date: 08/27/2018  OT End of Session - 08/27/18 1207    Visit Number  10    Number of Visits  12    Date for OT Re-Evaluation  09/05/18    Authorization Type  [employer]    Authorization Time Period  $15 co pay. No visit limit.    OT Start Time  1133    OT Stop Time  1215    OT Time Calculation (min)  42 min    Activity Tolerance  Patient tolerated treatment well    Behavior During Therapy  WFL for tasks assessed/performed       Past Medical History:  Diagnosis Date  . B12 deficiency    managed by Dr. Ouida SillsFagan: monthly injections  . Hematuria 05/11/2015  . Hemorrhoids   . Hyperlipemia   . Impaired fasting glucose   . Neuropathy    hereditary and idiopathic  . Osteoarthritis   . Pelvic pressure in female 05/11/2015  . PONV (postoperative nausea and vomiting)   . Transient cerebral ischemic attack    no impairment  . Vaginal atrophy 05/11/2015    Past Surgical History:  Procedure Laterality Date  . ABDOMINAL HYSTERECTOMY    . ANKLE FRACTURE SURGERY     left  . APPENDECTOMY    . CATARACT EXTRACTION    . COLONOSCOPY  09/30/2003   RMR: Normal colon. Subtle abnormalities in the ileocecal valve and terminal  ileum as described above of doubtful clinical significance/Normal rectum  . COLONOSCOPY N/A 11/03/2013   Procedure: COLONOSCOPY;  Surgeon: Corbin Adeobert M Rourk, MD;  [facility];  Service: Endoscopy;  Laterality: N/A;  830 - moved to 10:45 - Ginger to notify pt  . REVERSE SHOULDER ARTHROPLASTY Right 07/02/2018  . REVERSE  SHOULDER ARTHROPLASTY Right 07/02/2018   Procedure: RIGHT REVERSE SHOULDER ARTHROPLASTY;  Surgeon: Cammy Copaean, Gregory Scott, MD;  [facility];  Service: Orthopedics;  Laterality: Right;  . right leg fracture surgery     right  . TUBAL LIGATION      There were no vitals filed for this visit.  Subjective Assessment - 08/27/18 1150    Subjective   S: I wonder if I can do a 2# weight.    Currently in Pain?  Yes    Pain Score  3     Pain Location  Arm    Pain Orientation  Right    Pain Descriptors / Indicators  Sore    Pain Type  Acute pain    Pain Radiating Towards  N/A    Pain Onset  In the past 7 days    Pain Frequency  Constant    Aggravating Factors   Movement (external rotation)    Pain Relieving Factors  rest    Effect of Pain on Daily Activities  min effect    Multiple Pain Sites  No         [facility] OT Assessment - 08/27/18 1152      Assessment   Medical Diagnosis  right RSA      Precautions   Precautions  Shoulder  Type of Shoulder Precautions  OK for ROM and strengthening no greater than 15lbs.                OT Treatments/Exercises (OP) - 08/27/18 1152      Exercises   Exercises  Shoulder      Shoulder Exercises: Supine   Protraction  PROM;5 reps;Strengthening;12 reps    Protraction Weight (lbs)  1    Horizontal ABduction  PROM;5 reps;Strengthening;12 reps    Horizontal ABduction Weight (lbs)  1    External Rotation  PROM;5 reps;Strengthening;12 reps    External Rotation Weight (lbs)  1    Internal Rotation  PROM;5 reps;Strengthening;12 reps    Internal Rotation Weight (lbs)  1    Flexion  PROM;5 reps;Strengthening;12 reps    Shoulder Flexion Weight (lbs)  1    ABduction  PROM;5 reps;Strengthening;12 reps    Shoulder ABduction Weight (lbs)  1      Shoulder Exercises: Standing   Protraction  Strengthening;12 reps    Protraction Weight (lbs)  1    Horizontal ABduction  Strengthening;12 reps    Horizontal ABduction Weight (lbs)  1    External  Rotation  Strengthening;12 reps    External Rotation Weight (lbs)  1    Internal Rotation  Strengthening;12 reps    Internal Rotation Weight (lbs)  1    Flexion  Strengthening;12 reps    Shoulder Flexion Weight (lbs)  1    ABduction  Strengthening;12 reps;Limitations    Shoulder ABduction Weight (lbs)  1    ABduction Limitations  to shoulder level      Shoulder Exercises: Therapy Ball   Right/Left  5 reps      Shoulder Exercises: ROM/Strengthening   UBE (Upper Arm Bike)  Level 1 2' forward 2' reverse   pace: 6.0-7.0   Over Head Lace  2' seated    X to V Arms  10X A/ROM    Proximal Shoulder Strengthening, Supine  12X with 1# no rest breaks    Other ROM/Strengthening Exercises  Proximal shoulder strengthening with washcloth; 90 degrees shoulder flexion1', 90 degrees shoulder abduction 1'      Manual Therapy   Manual Therapy  Myofascial release    Manual therapy comments  completed seperately than all other interventions this date    Myofascial Release  myofascial release and manual stretching to right upper arm, scapular and shoulder region to decrease pain and fascial restrictions and improve pain free mobility in right shoulder region.               OT Short Term Goals - 07/31/18 1459      OT SHORT TERM GOAL #1   Title  Patient will be educated and independent with HEP to facilitate progress in therapy and allow her to return to using her RUE as her dominant extremity.    Time  3    Period  Weeks    Status  On-going    Target Date  08/15/18      OT SHORT TERM GOAL #2   Title  Patient will increase P/ROM of her RUE to WNL in order to increase her ability to get her shirts on and off with less difficulty.    Time  3    Period  Weeks    Status  On-going        OT Long Term Goals - 07/31/18 1459      OT LONG TERM GOAL #1   Title  Patient  will increase A/ROM of RUE to WNL in order to increase ability to reach over head and out to the side with less difficulty.     Time  6    Period  Weeks    Status  On-going      OT LONG TERM GOAL #2   Title  Patient will increase RUE strength to 4+/5 in order to return to gardening and normal household lifting tasks.    Time  6    Period  Weeks    Status  On-going      OT LONG TERM GOAL #3   Title  Patient will decrease fascial restrictions to min amount or less in order to increase her functional mobility needed for reaching above shoulder level.    Time  6    Period  Weeks    Status  On-going      OT LONG TERM GOAL #4   Title  Patient will report a decrease in pain level of approximately 3/10 or less when completing daily tasks with her RUE.    Time  6    Period  Weeks    Status  On-going            Plan - 08/27/18 1212    Clinical Impression Statement  A: patient is able to achieve A/ROM of her RUE to [facility]. She has progressed to 1# hand weights this session for supine and standing. She reports decreased shoulder endurance, strength, and scapular stability. She is utilizing her RUE for all daily tasks as she is able to. She is still cautious about lifting any amount of weight due to fear of re-injury. Pt was educated to complete HEP with 1# weight or a water bottle or can of soup. VC for form and technique were provided during session.    Body Structure / Function / Physical Skills  ADL;ROM;Fascial restriction;Mobility;Strength;Flexibility;UE functional use;Pain;Decreased knowledge of precautions    Plan  P: Continue with 1# strengthening. Add ball on the wall. Therapy ball strengthening if able to complete.    Consulted and Agree with Plan of Care  Patient       Patient will benefit from skilled therapeutic intervention in order to improve the following deficits and impairments:   Body Structure / Function / Physical Skills: ADL, ROM, Fascial restriction, Mobility, Strength, Flexibility, UE functional use, Pain, Decreased knowledge of precautions       Visit Diagnosis: 1. Other symptoms and signs  involving the musculoskeletal system   2. Stiffness of right shoulder, not elsewhere classified   3. Acute pain of right shoulder       Problem List Patient Active Problem List   Diagnosis Date Noted  . 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 Colles' fracture of right radius 09/04/17 09/20/2017  . Pelvic pressure in female 05/11/2015  . Hematuria 05/11/2015  . Vaginal atrophy 05/11/2015  . Encounter for screening colonoscopy 10/08/2013  . Ankle arthritis 05/19/2013  . Left ankle strain 05/19/2013  . Left ankle pain 05/19/2013  . PRIMARY LOCALIZED OSTEOARTHROSIS LOWER LEG 04/15/2008  . BUCKET HANDLE TEAR OF LATERAL MENISCUS 04/15/2008  . TEAR LATERAL MENISCUS 04/15/2008  . KNEE, ARTHRITIS, DEGEN./OSTEO 01/08/2008  . DERANGEMENT MENISCUS 01/08/2008  . LOOSE BODY-KNEE 01/08/2008  . KNEE PAIN 01/08/2008   [name], OTR/L,CBIS  [phone]  08/27/2018, 1:44 PM  [facility] [address] [place], [place], [address] Phone: [phone]   Fax:  [phone]  Name: Morgan BleakMary P Delia MRN: [id number]  Date of Birth: [birth year]

## 2018-08-27 NOTE — Telephone Encounter (Signed)
IC discussed with patient. She wanted letter mailed to her home address This was mailed today.

## 2018-08-29 ENCOUNTER — Encounter (HOSPITAL_COMMUNITY): Payer: Self-pay

## 2018-08-29 ENCOUNTER — Ambulatory Visit (HOSPITAL_COMMUNITY): Payer: PPO

## 2018-08-29 ENCOUNTER — Other Ambulatory Visit: Payer: Self-pay

## 2018-08-29 DIAGNOSIS — R29898 Other symptoms and signs involving the musculoskeletal system: Secondary | ICD-10-CM

## 2018-08-29 DIAGNOSIS — M25611 Stiffness of right shoulder, not elsewhere classified: Secondary | ICD-10-CM

## 2018-08-29 DIAGNOSIS — M25511 Pain in right shoulder: Secondary | ICD-10-CM

## 2018-08-29 NOTE — Therapy (Signed)
[facility] [address] , [place], [address] Phone: [phone]   Fax:  [phone]  Occupational Therapy Treatment  Patient Details  Name: [name] MRN: [id number] Date of Birth: [birth year] Referring Provider (OT): Rise PaganiniGregory Dean, MD   Encounter Date: 08/29/2018  OT End of Session - 08/29/18 1432    Visit Number  11    Number of Visits  12    Date for OT Re-Evaluation  09/05/18    Authorization Type  [employer]    Authorization Time Period  $15 co pay. No visit limit.    OT Start Time  1335    OT Stop Time  1415    OT Time Calculation (min)  40 min    Activity Tolerance  Patient tolerated treatment well    Behavior During Therapy  WFL for tasks assessed/performed       Past Medical History:  Diagnosis Date  . B12 deficiency    managed by Dr. Ouida SillsFagan: monthly injections  . Hematuria 05/11/2015  . Hemorrhoids   . Hyperlipemia   . Impaired fasting glucose   . Neuropathy    hereditary and idiopathic  . Osteoarthritis   . Pelvic pressure in female 05/11/2015  . PONV (postoperative nausea and vomiting)   . Transient cerebral ischemic attack    no impairment  . Vaginal atrophy 05/11/2015    Past Surgical History:  Procedure Laterality Date  . ABDOMINAL HYSTERECTOMY    . ANKLE FRACTURE SURGERY     left  . APPENDECTOMY    . CATARACT EXTRACTION    . COLONOSCOPY  09/30/2003   RMR: Normal colon. Subtle abnormalities in the ileocecal valve and terminal  ileum as described above of doubtful clinical significance/Normal rectum  . COLONOSCOPY N/A 11/03/2013   Procedure: COLONOSCOPY;  Surgeon: Corbin Adeobert M Rourk, MD;  [facility];  Service: Endoscopy;  Laterality: N/A;  830 - moved to 10:45 - Ginger to notify pt  . REVERSE SHOULDER ARTHROPLASTY Right 07/02/2018  . REVERSE SHOULDER ARTHROPLASTY Right 07/02/2018   Procedure: RIGHT REVERSE SHOULDER ARTHROPLASTY;  Surgeon: Cammy Copaean, Gregory Scott, MD;  [facility];  Service: Orthopedics;  Laterality: Right;  . right leg fracture surgery     right  . TUBAL LIGATION      There were no vitals filed for this visit.  Subjective Assessment - 08/29/18 1349    Subjective   S: I did my exercises with a water bottle. Boy, I'm surprised how heavy a water bottle can be.    Currently in Pain?  Yes    Pain Score  4     Pain Location  Arm    Pain Orientation  Right    Pain Descriptors / Indicators  Sore    Pain Type  Acute pain         [facility] OT Assessment - 08/29/18 1350      Assessment   Medical Diagnosis  right RSA      Precautions   Precautions  Shoulder    Type of Shoulder Precautions  OK for ROM and strengthening no greater than 15lbs.                OT Treatments/Exercises (OP) - 08/29/18 1350      Exercises   Exercises  Shoulder      Shoulder Exercises: Supine   Protraction  PROM;5 reps;Strengthening;12 reps    Protraction Weight (lbs)  1    Horizontal ABduction  PROM;5  reps;Strengthening;12 reps    Horizontal ABduction Weight (lbs)  1    External Rotation  PROM;5 reps;Strengthening;12 reps    External Rotation Weight (lbs)  1    Internal Rotation  PROM;5 reps;Strengthening;12 reps    Internal Rotation Weight (lbs)  1    Flexion  PROM;5 reps;Strengthening;12 reps    Shoulder Flexion Weight (lbs)  1    ABduction  PROM;5 reps;Strengthening;12 reps    Shoulder ABduction Weight (lbs)  1      Shoulder Exercises: Standing   Protraction  Strengthening;12 reps    Protraction Weight (lbs)  1    Horizontal ABduction  Strengthening;12 reps    Horizontal ABduction Weight (lbs)  1    External Rotation  Strengthening;12 reps    External Rotation Weight (lbs)  1    Internal Rotation  Strengthening;12 reps    Internal Rotation Weight (lbs)  1    Flexion  Strengthening;12 reps    Shoulder Flexion Weight (lbs)  1    ABduction  Strengthening;12 reps;Limitations    Shoulder ABduction Weight (lbs)  1    ABduction Limitations  to  shoulder level      Shoulder Exercises: Therapy Ball   Other Therapy Ball Exercises  green therapy ball: chest press, flexion, 10X      Shoulder Exercises: ROM/Strengthening   UBE (Upper Arm Bike)  Level 1 2' forward 2' reverse   pace: 6.0-7.0   Over Head Lace  2' seated    X to V Arms  10X with 1#    Proximal Shoulder Strengthening, Supine  12X with 1# no rest breaks    Ball on Wall  1' flexion 1' abduction green ball      Manual Therapy   Manual Therapy  Myofascial release    Manual therapy comments  completed seperately than all other interventions this date    Myofascial Release  myofascial release and manual stretching to right upper arm, scapular and shoulder region to decrease pain and fascial restrictions and improve pain free mobility in right shoulder region.               OT Short Term Goals - 07/31/18 1459      OT SHORT TERM GOAL #1   Title  Patient will be educated and independent with HEP to facilitate progress in therapy and allow her to return to using her RUE as her dominant extremity.    Time  3    Period  Weeks    Status  On-going    Target Date  08/15/18      OT SHORT TERM GOAL #2   Title  Patient will increase P/ROM of her RUE to WNL in order to increase her ability to get her shirts on and off with less difficulty.    Time  3    Period  Weeks    Status  On-going        OT Long Term Goals - 07/31/18 1459      OT LONG TERM GOAL #1   Title  Patient will increase A/ROM of RUE to WNL in order to increase ability to reach over head and out to the side with less difficulty.    Time  6    Period  Weeks    Status  On-going      OT LONG TERM GOAL #2   Title  Patient will increase RUE strength to 4+/5 in order to return to gardening and normal household lifting tasks.  Time  6    Period  Weeks    Status  On-going      OT LONG TERM GOAL #3   Title  Patient will decrease fascial restrictions to min amount or less in order to increase her  functional mobility needed for reaching above shoulder level.    Time  6    Period  Weeks    Status  On-going      OT LONG TERM GOAL #4   Title  Patient will report a decrease in pain level of approximately 3/10 or less when completing daily tasks with her RUE.    Time  6    Period  Weeks    Status  On-going            Plan - 08/29/18 1433    Clinical Impression Statement  A: Continued with 1# hand weights supine and standing. patient was able to complete full range for standing abduction with the handweight this session. Added ball on the wall and therapy ball strengthening (flexion and chest press) to continue focusing on shoulder strength and stability. VC for form and technique. Manual techniques completed to address fascial restrictions in the RUE.    Body Structure / Function / Physical Skills  ADL;ROM;Fascial restriction;Mobility;Strength;Flexibility;UE functional use;Pain;Decreased knowledge of precautions    Plan  P: Add therapy ball overhead press and circles. Add functional reaching task in cabinet.    Consulted and Agree with Plan of Care  Patient       Patient will benefit from skilled therapeutic intervention in order to improve the following deficits and impairments:   Body Structure / Function / Physical Skills: ADL, ROM, Fascial restriction, Mobility, Strength, Flexibility, UE functional use, Pain, Decreased knowledge of precautions       Visit Diagnosis: 1. Stiffness of right shoulder, not elsewhere classified   2. Other symptoms and signs involving the musculoskeletal system   3. Acute pain of right shoulder       Problem List Patient Active Problem List   Diagnosis Date Noted  . 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 Colles' fracture of right radius 09/04/17 09/20/2017  . Pelvic pressure in female 05/11/2015  . Hematuria 05/11/2015  . Vaginal atrophy 05/11/2015  . Encounter for screening colonoscopy 10/08/2013  . Ankle arthritis 05/19/2013  . Left ankle  strain 05/19/2013  . Left ankle pain 05/19/2013  . PRIMARY LOCALIZED OSTEOARTHROSIS LOWER LEG 04/15/2008  . BUCKET HANDLE TEAR OF LATERAL MENISCUS 04/15/2008  . TEAR LATERAL MENISCUS 04/15/2008  . KNEE, ARTHRITIS, DEGEN./OSTEO 01/08/2008  . DERANGEMENT MENISCUS 01/08/2008  . LOOSE BODY-KNEE 01/08/2008  . KNEE PAIN 01/08/2008   Ailene Ravel, OTR/L,CBIS  [phone]  08/29/2018, 2:35 PM  [place] [address] [place], [place], [address] Phone: [phone]   Fax:  [phone]  Name: [name] MRN: [id number] Date of Birth: 1940/12/21

## 2018-09-03 ENCOUNTER — Other Ambulatory Visit: Payer: Self-pay

## 2018-09-03 ENCOUNTER — Ambulatory Visit (HOSPITAL_COMMUNITY): Payer: PPO

## 2018-09-03 ENCOUNTER — Encounter (HOSPITAL_COMMUNITY): Payer: Self-pay

## 2018-09-03 DIAGNOSIS — R29898 Other symptoms and signs involving the musculoskeletal system: Secondary | ICD-10-CM | POA: Diagnosis not present

## 2018-09-03 DIAGNOSIS — M25511 Pain in right shoulder: Secondary | ICD-10-CM

## 2018-09-03 DIAGNOSIS — M25611 Stiffness of right shoulder, not elsewhere classified: Secondary | ICD-10-CM

## 2018-09-03 NOTE — Therapy (Signed)
[facility] [facility] [address] [place], [place], [address] Phone: [phone]   Fax:  [phone]  Occupational Therapy Treatment  Patient Details  Name: [name] MRN: [id number] Date of Birth: 1940/09/24 Referring Provider (OT): Rise PaganiniGregory Dean, MD   Encounter Date: 09/03/2018  OT End of Session - 09/03/18 1206    Visit Number  12    Number of Visits  12    Date for OT Re-Evaluation  09/05/18    Authorization Type  [employer]    Authorization Time Period  $15 co pay. No visit limit.    OT Start Time  1135    OT Stop Time  1213    OT Time Calculation (min)  38 min    Activity Tolerance  Patient tolerated treatment well    Behavior During Therapy  WFL for tasks assessed/performed       Past Medical History:  Diagnosis Date  . B12 deficiency    managed by Dr. Ouida SillsFagan: monthly injections  . Hematuria 05/11/2015  . Hemorrhoids   . Hyperlipemia   . Impaired fasting glucose   . Neuropathy    hereditary and idiopathic  . Osteoarthritis   . Pelvic pressure in female 05/11/2015  . PONV (postoperative nausea and vomiting)   . Transient cerebral ischemic attack    no impairment  . Vaginal atrophy 05/11/2015    Past Surgical History:  Procedure Laterality Date  . ABDOMINAL HYSTERECTOMY    . ANKLE FRACTURE SURGERY     left  . APPENDECTOMY    . CATARACT EXTRACTION    . COLONOSCOPY  09/30/2003   RMR: Normal colon. Subtle abnormalities in the ileocecal valve and terminal  ileum as described above of doubtful clinical significance/Normal rectum  . COLONOSCOPY N/A 11/03/2013   Procedure: COLONOSCOPY;  Surgeon: Corbin Adeobert M Rourk, MD;  [facility];  Service: Endoscopy;  Laterality: N/A;  830 - moved to 10:45 - Ginger to notify pt  . REVERSE SHOULDER ARTHROPLASTY Right 07/02/2018  . REVERSE SHOULDER ARTHROPLASTY Right 07/02/2018   Procedure: RIGHT REVERSE SHOULDER ARTHROPLASTY;  Surgeon: Cammy Copaean, Gregory Scott, MD;  [facility];  Service: Orthopedics;  Laterality: Right;  . right leg fracture surgery     right  . TUBAL LIGATION      There were no vitals filed for this visit.  Subjective Assessment - 09/03/18 1154    Subjective   S: I didn't do my exercises this morning but I was busy working it in the yard.    Currently in Pain?  Yes    Pain Score  4     Pain Location  Arm    Pain Orientation  Right    Pain Descriptors / Indicators  Sore    Pain Type  Acute pain    Pain Radiating Towards  N/A    Pain Onset  In the past 7 days    Pain Frequency  Constant    Aggravating Factors   movement and use    Pain Relieving Factors  rest    Effect of Pain on Daily Activities  min effect         [facility] OT Assessment - 09/03/18 1155      Assessment   Medical Diagnosis  right RSA      Precautions   Precautions  Shoulder    Type of Shoulder Precautions  OK for ROM and strengthening no greater than 15lbs.  OT Treatments/Exercises (OP) - 09/03/18 1155      Exercises   Exercises  Shoulder      Shoulder Exercises: Supine   Protraction  PROM;5 reps;Strengthening;15 reps    Protraction Weight (lbs)  1    Horizontal ABduction  PROM;5 reps;Strengthening;15 reps    Horizontal ABduction Weight (lbs)  1    External Rotation  PROM;5 reps;Strengthening;15 reps    External Rotation Weight (lbs)  1    Internal Rotation  PROM;5 reps;Strengthening;15 reps    Internal Rotation Weight (lbs)  1    Flexion  PROM;5 reps;Strengthening;15 reps    Shoulder Flexion Weight (lbs)  1    ABduction  PROM;5 reps;Strengthening;15 reps    Shoulder ABduction Weight (lbs)  1      Shoulder Exercises: Therapy Ball   Right/Left  5 reps    Other Therapy Ball Exercises  green therapy ball: circles, chest press, flexion, diagonals 12X      Shoulder Exercises: ROM/Strengthening   Over Head Lace  2' seated    X to V Arms  10X with 1#    Proximal Shoulder Strengthening, Supine  15X with 1# no rest breaks                OT Short Term Goals - 07/31/18 1459      OT SHORT TERM GOAL #1   Title  Patient will be educated and independent with HEP to facilitate progress in therapy and allow her to return to using her RUE as her dominant extremity.    Time  3    Period  Weeks    Status  On-going    Target Date  08/15/18      OT SHORT TERM GOAL #2   Title  Patient will increase P/ROM of her RUE to WNL in order to increase her ability to get her shirts on and off with less difficulty.    Time  3    Period  Weeks    Status  On-going        OT Long Term Goals - 07/31/18 1459      OT LONG TERM GOAL #1   Title  Patient will increase A/ROM of RUE to WNL in order to increase ability to reach over head and out to the side with less difficulty.    Time  6    Period  Weeks    Status  On-going      OT LONG TERM GOAL #2   Title  Patient will increase RUE strength to 4+/5 in order to return to gardening and normal household lifting tasks.    Time  6    Period  Weeks    Status  On-going      OT LONG TERM GOAL #3   Title  Patient will decrease fascial restrictions to min amount or less in order to increase her functional mobility needed for reaching above shoulder level.    Time  6    Period  Weeks    Status  On-going      OT LONG TERM GOAL #4   Title  Patient will report a decrease in pain level of approximately 3/10 or less when completing daily tasks with her RUE.    Time  6    Period  Weeks    Status  On-going            Plan - 09/03/18 1206    Clinical Impression Statement  A: Patient was able to  progress to 15 repetitions for strengthening exercises. Add therapy ball strengthening exercises including adding circles and diagonals. patient completed with max difficulty. VC for form and technique. Continues to require manual techniques to right UE for increased fascial restrictions. Verbal education provided to use a small ball at home and pass behind head and behind back to work  on internal and external rotation.    Body Structure / Function / Physical Skills  ADL;ROM;Fascial restriction;Mobility;Strength;Flexibility;UE functional use;Pain;Decreased knowledge of precautions    Plan  P: Reassess for possible discharge.    Consulted and Agree with Plan of Care  Patient       Patient will benefit from skilled therapeutic intervention in order to improve the following deficits and impairments:   Body Structure / Function / Physical Skills: ADL, ROM, Fascial restriction, Mobility, Strength, Flexibility, UE functional use, Pain, Decreased knowledge of precautions       Visit Diagnosis: 1. Acute pain of right shoulder   2. Other symptoms and signs involving the musculoskeletal system   3. Stiffness of right shoulder, not elsewhere classified       Problem List Patient Active Problem List   Diagnosis Date Noted  . 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 Colles' fracture of right radius 09/04/17 09/20/2017  . Pelvic pressure in female 05/11/2015  . Hematuria 05/11/2015  . Vaginal atrophy 05/11/2015  . Encounter for screening colonoscopy 10/08/2013  . Ankle arthritis 05/19/2013  . Left ankle strain 05/19/2013  . Left ankle pain 05/19/2013  . PRIMARY LOCALIZED OSTEOARTHROSIS LOWER LEG 04/15/2008  . BUCKET HANDLE TEAR OF LATERAL MENISCUS 04/15/2008  . TEAR LATERAL MENISCUS 04/15/2008  . KNEE, ARTHRITIS, DEGEN./OSTEO 01/08/2008  . DERANGEMENT MENISCUS 01/08/2008  . LOOSE BODY-KNEE 01/08/2008  . KNEE PAIN 01/08/2008   Limmie PatriciaLaura Essenmacher, OTR/L,CBIS  [phone]  09/03/2018, 12:18 PM  [facility] [address] [place], [place], [address] Phone: [phone]   Fax:  [phone]  Name: [name] MRN: [id number] Date of Birth: October 25, 1940

## 2018-09-05 ENCOUNTER — Encounter (HOSPITAL_COMMUNITY): Payer: Self-pay

## 2018-09-05 ENCOUNTER — Other Ambulatory Visit: Payer: Self-pay

## 2018-09-05 ENCOUNTER — Ambulatory Visit (HOSPITAL_COMMUNITY): Payer: PPO

## 2018-09-05 DIAGNOSIS — E538 Deficiency of other specified B group vitamins: Secondary | ICD-10-CM | POA: Diagnosis not present

## 2018-09-05 DIAGNOSIS — M25611 Stiffness of right shoulder, not elsewhere classified: Secondary | ICD-10-CM

## 2018-09-05 DIAGNOSIS — R29898 Other symptoms and signs involving the musculoskeletal system: Secondary | ICD-10-CM | POA: Diagnosis not present

## 2018-09-05 DIAGNOSIS — M25511 Pain in right shoulder: Secondary | ICD-10-CM

## 2018-09-05 NOTE — Therapy (Signed)
[place], [place], [address] Phone: [phone]   Fax:  [phone]  Occupational Therapy Treatment Reassess/discharge Patient Details  Name: [name] MRN: [id number] Date of Birth: [birth year] Referring Provider (OT): Marcene Duos, MD   Encounter Date: 09/05/2018  OT End of Session - 09/05/18 1421    Visit Number  13    Number of Visits  13    Authorization Type  [employer]    Authorization Time Period  $15 co pay. No visit limit.    OT Start Time  1335   reassessment and discharge   OT Stop Time  1415    OT Time Calculation (min)  40 min    Activity Tolerance  Patient tolerated treatment well    Behavior During Therapy  WFL for tasks assessed/performed       Past Medical History:  Diagnosis Date  . B12 deficiency    managed by Dr. Willey Blade: monthly injections  . Hematuria 05/11/2015  . Hemorrhoids   . Hyperlipemia   . Impaired fasting glucose   . Neuropathy    hereditary and idiopathic  . Osteoarthritis   . Pelvic pressure in female 05/11/2015  . PONV (postoperative nausea and vomiting)   . Transient cerebral ischemic attack    no impairment  . Vaginal atrophy 05/11/2015    Past Surgical History:  Procedure Laterality Date  . ABDOMINAL HYSTERECTOMY    . ANKLE FRACTURE SURGERY     left  . APPENDECTOMY    . CATARACT EXTRACTION    . COLONOSCOPY  09/30/2003   RMR: Normal colon. Subtle abnormalities in the ileocecal valve and terminal  ileum as described above of doubtful clinical significance/Normal rectum  . COLONOSCOPY N/A 11/03/2013   Procedure: COLONOSCOPY;  Surgeon: Daneil Dolin, MD;  [facility];  Service: Endoscopy;  Laterality: N/A;  830 - moved to 10:45 - Ginger to notify pt  . REVERSE SHOULDER ARTHROPLASTY Right 07/02/2018  . REVERSE SHOULDER ARTHROPLASTY Right 07/02/2018   Procedure: RIGHT REVERSE SHOULDER ARTHROPLASTY;  Surgeon: Meredith Pel, MD;   Location: [place];  Service: Orthopedics;  Laterality: Right;  . right leg fracture surgery     right  . TUBAL LIGATION      There were no vitals filed for this visit.  Subjective Assessment - 09/05/18 1407    Subjective   S: No pain. It's not bothering me today.    Currently in Pain?  No/denies         [facility] OT Assessment - 09/05/18 1338      Assessment   Medical Diagnosis  right RSA      Precautions   Precautions  Shoulder    Type of Shoulder Precautions  OK for ROM and strengthening no greater than 15lbs.       ROM / Strength   AROM / PROM / Strength  AROM;PROM;Strength      Palpation   Palpation comment  trace amount of fascial restrictions noted in right upper arm, trapezius, and scapularis region.       AROM   Overall AROM Comments  Assessed seated. IR/er adducted.    AROM Assessment Site  Shoulder    Right/Left Shoulder  Right    Right Shoulder Flexion  150 Degrees   previous; 105   Right Shoulder ABduction  155 Degrees   previous; 120   Right Shoulder Internal Rotation  90 Degrees   previous: same   Right Shoulder  External Rotation  50 Degrees   previous: 15     PROM   Overall PROM Comments  Assessed supine. IR/er adducted    PROM Assessment Site  Shoulder    Right/Left Shoulder  Right    Right Shoulder Flexion  160 Degrees   previous: 115   Right Shoulder ABduction  180 Degrees   preivous: same   Right Shoulder Internal Rotation  90 Degrees   previous; same   Right Shoulder External Rotation  70 Degrees   previous: 35     Strength   Overall Strength Comments  Assessed seated. IR/er adducted    Strength Assessment Site  Shoulder    Right/Left Shoulder  Right    Right Shoulder Flexion  4+/5   previous: 3-/5   Right Shoulder ABduction  4+/5   previous: 3-/5   Right Shoulder Internal Rotation  4/5   previous: 3/5   Right Shoulder External Rotation  4/5   previous; 3-/5              OT Treatments/Exercises (OP) - 09/05/18 1409       Exercises   Exercises  Shoulder      Shoulder Exercises: Supine   Protraction  PROM;5 reps    Horizontal ABduction  PROM;5 reps    External Rotation  PROM;5 reps    Internal Rotation  PROM;5 reps    Flexion  PROM;5 reps    ABduction  PROM;5 reps      Shoulder Exercises: Standing   Horizontal ABduction  Theraband;10 reps    Theraband Level (Shoulder Horizontal ABduction)  Level 2 (Red)    External Rotation  Theraband;10 reps    Theraband Level (Shoulder External Rotation)  Level 2 (Red)    Internal Rotation  Theraband;10 reps    Theraband Level (Shoulder Internal Rotation)  Level 2 (Red)    ABduction  Theraband;10 reps    Theraband Level (Shoulder ABduction)  Level 2 (Red)    Diagonals  Theraband;10 reps    Theraband Level (Shoulder Diagonals)  Level 2 (Red)      Manual Therapy   Manual Therapy  Myofascial release    Manual therapy comments  completed seperately than all other interventions this date    Myofascial Release  myofascial release and manual stretching to right upper arm, scapular and shoulder region to decrease pain and fascial restrictions and improve pain free mobility in right shoulder region.             OT Education - 09/05/18 1405    Education Details  shoulder strengthening with red theraband. Patient was told to continue to complete retraction and extension for scapular strengthening.    Person(s) Educated  Patient    Methods  Explanation;Demonstration;Handout;Verbal cues    Comprehension  Returned demonstration;Verbalized understanding       OT Short Term Goals - 09/05/18 1353      OT SHORT TERM GOAL #1   Title  Patient will be educated and independent with HEP to facilitate progress in therapy and allow her to return to using her RUE as her dominant extremity.    Time  3    Period  Weeks    Status  Achieved    Target Date  08/15/18      OT SHORT TERM GOAL #2   Title  Patient will increase P/ROM of her RUE to WNL in order to increase her  ability to get her shirts on and off with less difficulty.  Time  3    Period  Weeks    Status  Achieved        OT Long Term Goals - 09/05/18 1354      OT LONG TERM GOAL #1   Title  Patient will increase A/ROM of RUE to WNL in order to increase ability to reach over head and out to the side with less difficulty.    Time  6    Period  Weeks    Status  Achieved      OT LONG TERM GOAL #2   Title  Patient will increase RUE strength to 4+/5 in order to return to gardening and normal household lifting tasks.    Time  6    Period  Weeks    Status  Partially Met      OT LONG TERM GOAL #3   Title  Patient will decrease fascial restrictions to min amount or less in order to increase her functional mobility needed for reaching above shoulder level.    Time  6    Period  Weeks    Status  Achieved      OT LONG TERM GOAL #4   Title  Patient will report a decrease in pain level of approximately 3/10 or less when completing daily tasks with her RUE.    Time  6    Period  Weeks    Status  Achieved            Plan - 09/05/18 1421    Clinical Impression Statement  A: reassessment completed this date. patient has all therapy goals except one related to strength which she has partially met. Patient demonstrates 4/5 strength for shoulder external and internal rotation. Pt reports that she is using it as her dominant extremity at home for all daily tasks. She is still cautious with lifting as she feels her strength is not where it should be yet. pt is able to complete her HEP at home independently while focusing on shoulder strength. Discharge is recommended due to patient's progress. Patient verbalized understanding and is in agreement. HEP was updated and reviewed.    Body Structure / Function / Physical Skills  ADL;ROM;Fascial restriction;Mobility;Strength;Flexibility;UE functional use;Pain;Decreased knowledge of precautions    Plan  P: Discharge from OT services with HEP. Follow up with MD  as needed.    Consulted and Agree with Plan of Care  Patient       Patient will benefit from skilled therapeutic intervention in order to improve the following deficits and impairments:   Body Structure / Function / Physical Skills: ADL, ROM, Fascial restriction, Mobility, Strength, Flexibility, UE functional use, Pain, Decreased knowledge of precautions       Visit Diagnosis: 1. Other symptoms and signs involving the musculoskeletal system   2. Stiffness of right shoulder, not elsewhere classified   3. Acute pain of right shoulder       Problem List Patient Active Problem List   Diagnosis Date Noted  . 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 Colles' fracture of right radius 09/04/17 09/20/2017  . Pelvic pressure in female 05/11/2015  . Hematuria 05/11/2015  . Vaginal atrophy 05/11/2015  . Encounter for screening colonoscopy 10/08/2013  . Ankle arthritis 05/19/2013  . Left ankle strain 05/19/2013  . Left ankle pain 05/19/2013  . PRIMARY LOCALIZED OSTEOARTHROSIS LOWER LEG 04/15/2008  . BUCKET HANDLE TEAR OF LATERAL MENISCUS 04/15/2008  . TEAR LATERAL MENISCUS 04/15/2008  . KNEE, ARTHRITIS, DEGEN./OSTEO 01/08/2008  . DERANGEMENT MENISCUS 01/08/2008  .  LOOSE BODY-KNEE 01/08/2008  . KNEE PAIN 01/08/2008   OCCUPATIONAL THERAPY DISCHARGE SUMMARY  Visits from Start of Care: 13  Current functional level related to goals / functional outcomes: See above   Remaining deficits: See above   Education / Equipment: See above Plan: Patient agrees to discharge.  Patient goals were met. Patient is being discharged due to meeting the stated rehab goals.  ?????         Ailene Ravel, OTR/L,CBIS  [phone]  09/05/2018, 2:25 PM  [place], [place], [address] Phone: [phone]   Fax:  [phone]  Name: GREENLEIGH KAUTH MRN: [id number] Date of Birth: 1940-05-03

## 2018-09-05 NOTE — Patient Instructions (Signed)
Strengthening: Chest Pull - Resisted   Hold Theraband in front of body with hands about shoulder width a part. Pull band a part and back together slowly. Repeat _10-12___ times. Complete __1__ set(s) per session.. Repeat _1___ session(s) per day.  http://orth.exer.us/926   Copyright  VHI. All rights reserved.   PNF Strengthening: Resisted   Standing with resistive band around each hand, bring right arm up and away, thumb back. Repeat _10-12___ times per set. Do __1__ sets per session. Do __1__ sessions per day.       Resisted External Rotation: in Neutral - Bilateral   Sit or stand, tubing in both hands, elbows at sides, bent to 90, forearms forward. Pinch shoulder blades together and rotate forearms out. Keep elbows at sides. Repeat _10-12___ times per set. Do __1__ sets per session. Do ___1_ sessions per day.  http://orth.exer.us/966   Copyright  VHI. All rights reserved.   PNF Strengthening: Resisted   Standing, hold resistive band above head. Bring right arm down and out from side. Repeat __10-12__ times per set. Do __1__ sets per session. Do __1__ sessions per day.  http://orth.exer.us/922   Copyright  VHI. All rights reserved.   

## 2018-09-10 ENCOUNTER — Ambulatory Visit (HOSPITAL_COMMUNITY): Payer: PPO

## 2018-09-12 ENCOUNTER — Encounter (HOSPITAL_COMMUNITY): Payer: PPO

## 2018-10-08 DIAGNOSIS — E538 Deficiency of other specified B group vitamins: Secondary | ICD-10-CM | POA: Diagnosis not present

## 2018-11-14 DIAGNOSIS — Z23 Encounter for immunization: Secondary | ICD-10-CM | POA: Diagnosis not present

## 2018-11-14 DIAGNOSIS — E538 Deficiency of other specified B group vitamins: Secondary | ICD-10-CM | POA: Diagnosis not present

## 2018-12-18 DIAGNOSIS — E538 Deficiency of other specified B group vitamins: Secondary | ICD-10-CM | POA: Diagnosis not present

## 2019-01-01 DIAGNOSIS — G9009 Other idiopathic peripheral autonomic neuropathy: Secondary | ICD-10-CM | POA: Diagnosis not present

## 2019-01-01 DIAGNOSIS — G459 Transient cerebral ischemic attack, unspecified: Secondary | ICD-10-CM | POA: Diagnosis not present

## 2019-01-20 DIAGNOSIS — E538 Deficiency of other specified B group vitamins: Secondary | ICD-10-CM | POA: Diagnosis not present

## 2019-02-07 DIAGNOSIS — J209 Acute bronchitis, unspecified: Secondary | ICD-10-CM | POA: Diagnosis not present

## 2019-02-10 ENCOUNTER — Other Ambulatory Visit: Payer: Self-pay

## 2019-02-10 ENCOUNTER — Ambulatory Visit: Payer: PPO | Attending: Internal Medicine

## 2019-02-10 DIAGNOSIS — Z20822 Contact with and (suspected) exposure to covid-19: Secondary | ICD-10-CM

## 2019-02-11 LAB — NOVEL CORONAVIRUS, NAA: SARS-CoV-2, NAA: NOT DETECTED

## 2019-02-13 DIAGNOSIS — Z961 Presence of intraocular lens: Secondary | ICD-10-CM | POA: Diagnosis not present

## 2019-02-13 DIAGNOSIS — H52203 Unspecified astigmatism, bilateral: Secondary | ICD-10-CM | POA: Diagnosis not present

## 2019-03-06 DIAGNOSIS — E538 Deficiency of other specified B group vitamins: Secondary | ICD-10-CM | POA: Diagnosis not present

## 2019-03-18 IMAGING — CT CT SHOULDER*R* W/O CM
3 of 4 series · 13 of 35 positions shown, 16 images · non-contrast
Comparison: MRI 11/14/2017

CLINICAL DATA: Preop for joint replacement surgery.

EXAM:
CT OF THE UPPER RIGHT EXTREMITY WITHOUT CONTRAST
TECHNIQUE: Multidetector CT imaging of the upper right extremity was performed
according to the standard protocol.

[Series 8: ax st · axial · 0.39mm/px · z∈[+1117,+1230]mm · 5 of 300 slices shown, 7 images]
[im 50/300  soft-tissue]
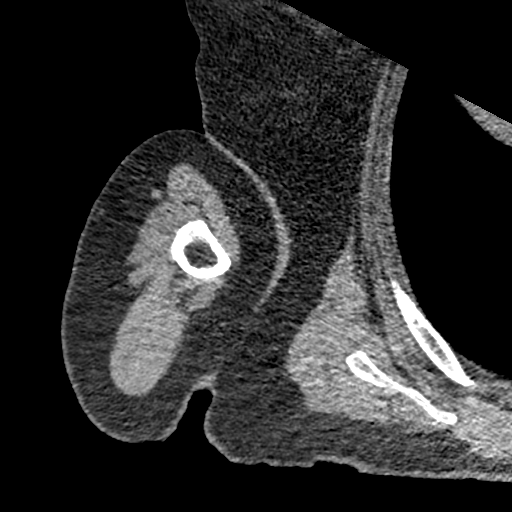
[im 50/300  bone]
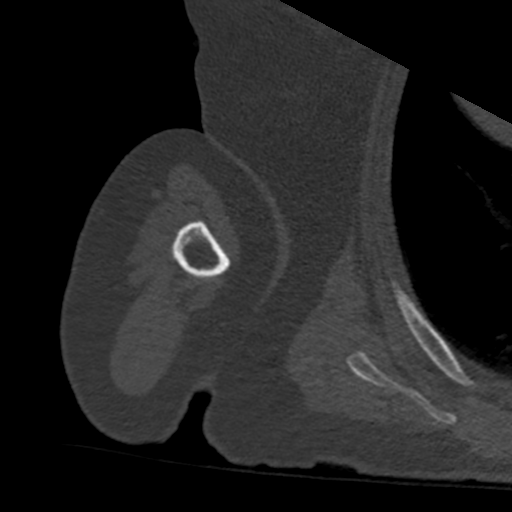
[im 100/300  bone]
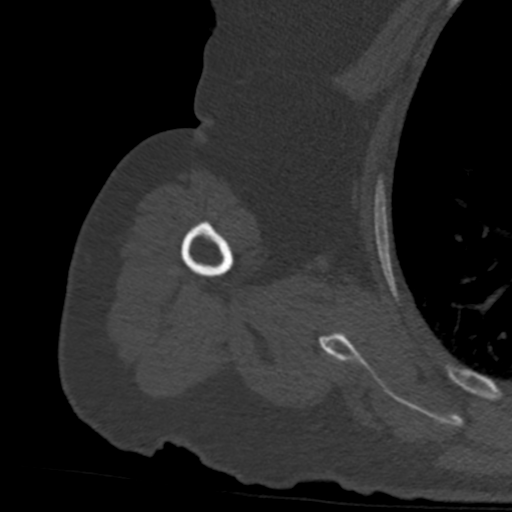
[im 150/300  bone]
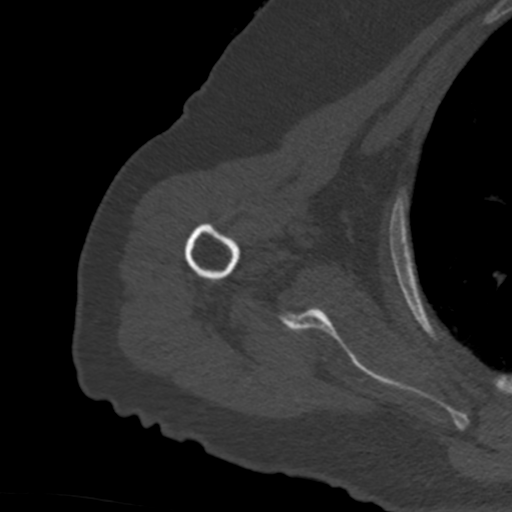
[im 200/300  bone]
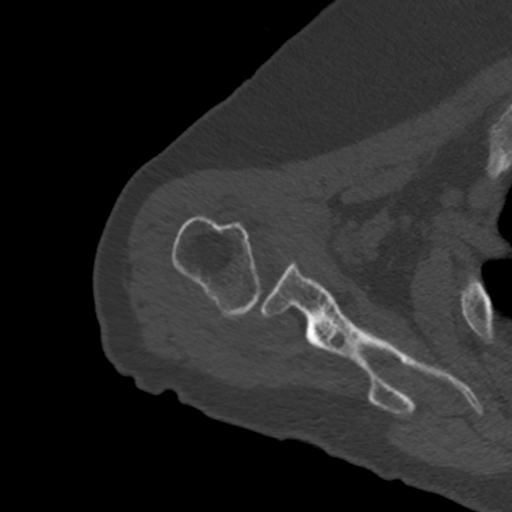
[im 250/300  soft-tissue]
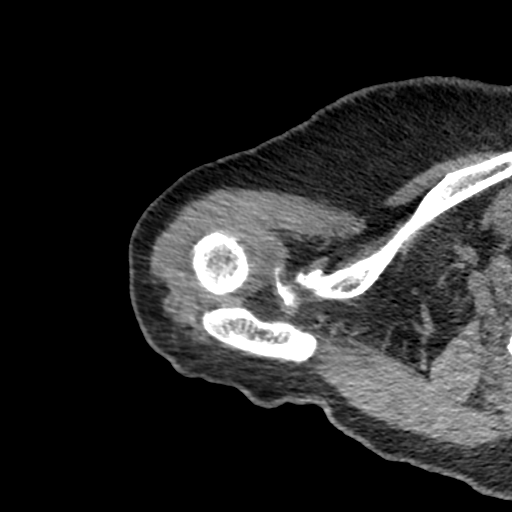
[im 250/300  bone]
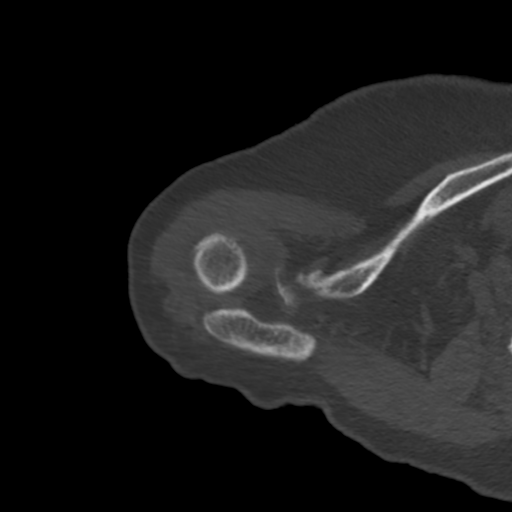

[Series 9: cor st · coronal · 0.36mm/px · 3 of 326 slices shown]
[im 66/326  bone]
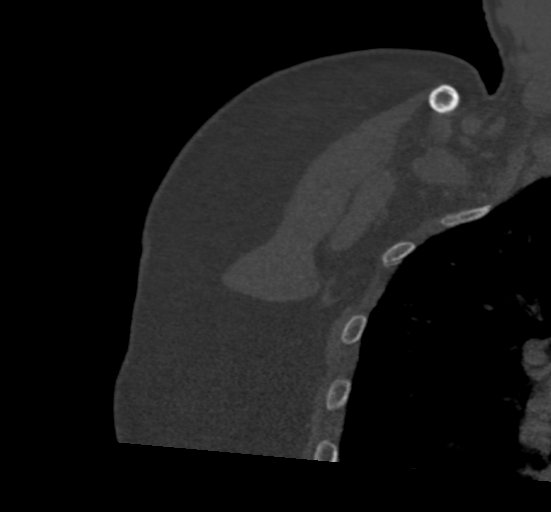
[im 131/326  bone]
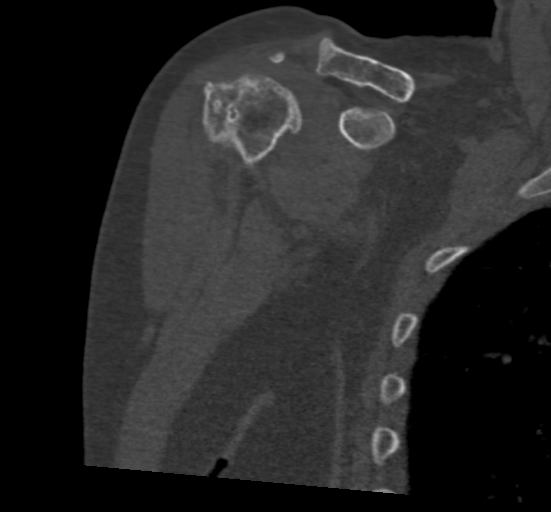
[im 196/326  bone]
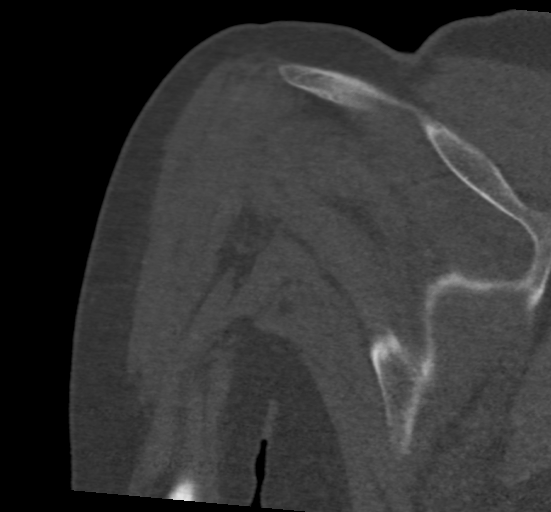

[Series 10: sag st · sagittal · 0.36mm/px · 5 of 334 slices shown, 6 images]
[im 112/334  bone]
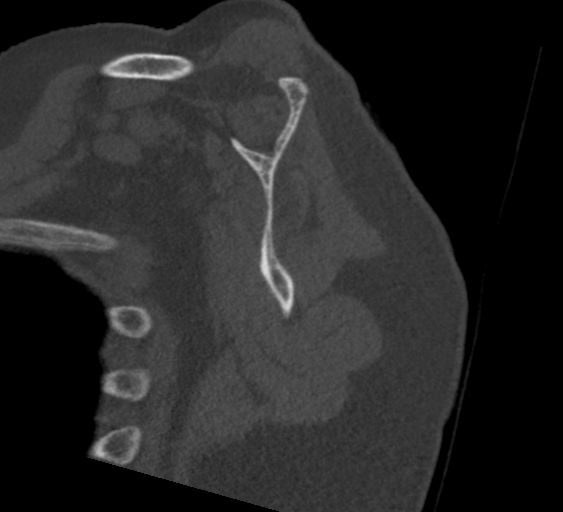
[im 139/334  bone]
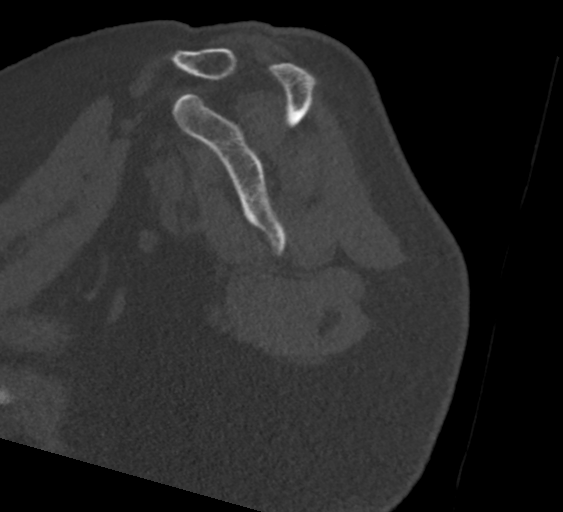
[im 167/334  soft-tissue]
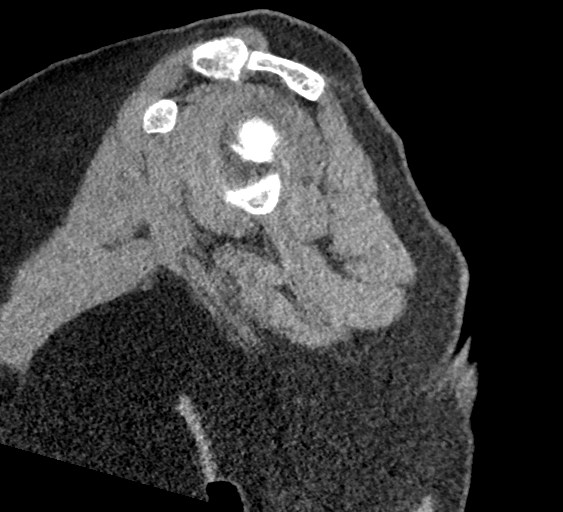
[im 167/334  bone]
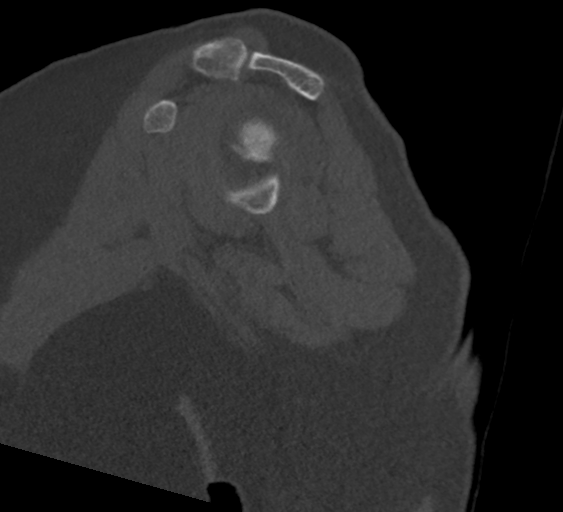
[im 195/334  bone]
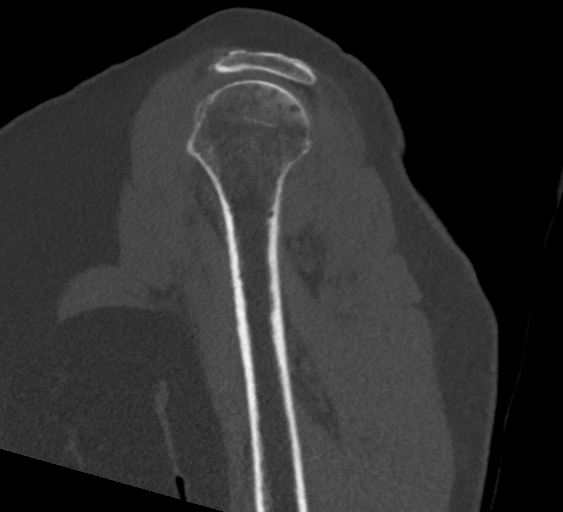
[im 223/334  bone]
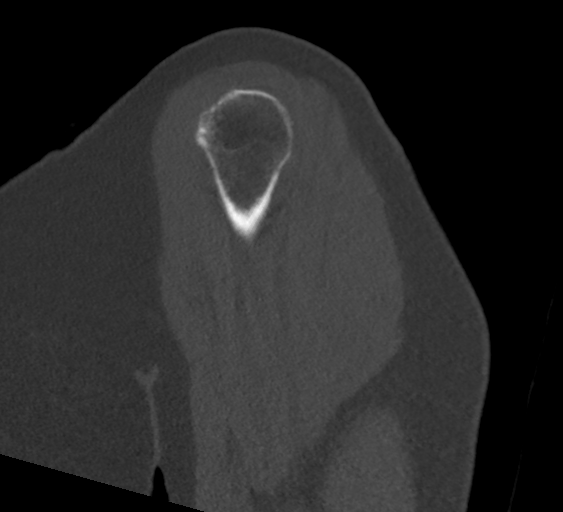

[13 of 35 positions shown; findings below may reference images not displayed]

FINDINGS: Moderate glenohumeral joint degenerative changes with joint space
narrowing and spurring. No bony eburnation or subchondral cystic
change. No widening or thinning of the glenoid. The humeral head is
riding high in the glenoid fossa due to the full-thickness retracted
rotator cuff tears. No ossified loose bodies are identified.

Moderate AC joint degenerative changes. The acromion is type 2 in
shape. There is mild undersurface spurring but no significant
lateral downsloping.

The visualized portions of the right lung are unremarkable. No
worrisome pulmonary lesions.
IMPRESSION: 1. Moderate glenohumeral joint degenerative changes.
2. Marked narrowing of the humeroacromial space with the humeral
head riding high in the glenoid fossa due to the full-thickness
retracted rotator cuff tears.
3. Moderate AC joint degenerative changes and inferior spurring from
the type 2 acromion.

## 2019-03-24 ENCOUNTER — Other Ambulatory Visit (HOSPITAL_COMMUNITY): Payer: Self-pay | Admitting: Internal Medicine

## 2019-03-24 DIAGNOSIS — Z1231 Encounter for screening mammogram for malignant neoplasm of breast: Secondary | ICD-10-CM

## 2019-04-07 ENCOUNTER — Ambulatory Visit (HOSPITAL_COMMUNITY)
Admission: RE | Admit: 2019-04-07 | Discharge: 2019-04-07 | Disposition: A | Discharge status: Home / Self Care | Payer: PPO | Source: Ambulatory Visit | Attending: Internal Medicine | Admitting: Internal Medicine

## 2019-04-07 ENCOUNTER — Other Ambulatory Visit: Payer: Self-pay

## 2019-04-07 DIAGNOSIS — E538 Deficiency of other specified B group vitamins: Secondary | ICD-10-CM | POA: Diagnosis not present

## 2019-04-07 DIAGNOSIS — Z1231 Encounter for screening mammogram for malignant neoplasm of breast: Secondary | ICD-10-CM | POA: Diagnosis not present

## 2019-05-12 DIAGNOSIS — E538 Deficiency of other specified B group vitamins: Secondary | ICD-10-CM | POA: Diagnosis not present

## 2019-06-12 DIAGNOSIS — E538 Deficiency of other specified B group vitamins: Secondary | ICD-10-CM | POA: Diagnosis not present

## 2019-07-15 DIAGNOSIS — G459 Transient cerebral ischemic attack, unspecified: Secondary | ICD-10-CM | POA: Diagnosis not present

## 2019-07-15 DIAGNOSIS — Z79899 Other long term (current) drug therapy: Secondary | ICD-10-CM | POA: Diagnosis not present

## 2019-07-15 DIAGNOSIS — E538 Deficiency of other specified B group vitamins: Secondary | ICD-10-CM | POA: Diagnosis not present

## 2019-07-15 DIAGNOSIS — G629 Polyneuropathy, unspecified: Secondary | ICD-10-CM | POA: Diagnosis not present

## 2019-07-22 DIAGNOSIS — E538 Deficiency of other specified B group vitamins: Secondary | ICD-10-CM | POA: Diagnosis not present

## 2019-07-22 DIAGNOSIS — G9009 Other idiopathic peripheral autonomic neuropathy: Secondary | ICD-10-CM | POA: Diagnosis not present

## 2019-07-22 DIAGNOSIS — R001 Bradycardia, unspecified: Secondary | ICD-10-CM | POA: Diagnosis not present

## 2019-07-22 DIAGNOSIS — Z6830 Body mass index (BMI) 30.0-30.9, adult: Secondary | ICD-10-CM | POA: Diagnosis not present

## 2019-07-22 DIAGNOSIS — Z0001 Encounter for general adult medical examination with abnormal findings: Secondary | ICD-10-CM | POA: Diagnosis not present

## 2019-07-22 DIAGNOSIS — M1712 Unilateral primary osteoarthritis, left knee: Secondary | ICD-10-CM | POA: Diagnosis not present

## 2019-07-22 DIAGNOSIS — E785 Hyperlipidemia, unspecified: Secondary | ICD-10-CM | POA: Diagnosis not present

## 2019-08-25 DIAGNOSIS — E538 Deficiency of other specified B group vitamins: Secondary | ICD-10-CM | POA: Diagnosis not present

## 2019-09-12 ENCOUNTER — Ambulatory Visit: Payer: PPO

## 2019-09-12 ENCOUNTER — Other Ambulatory Visit: Payer: Self-pay

## 2019-09-12 ENCOUNTER — Ambulatory Visit (INDEPENDENT_AMBULATORY_CARE_PROVIDER_SITE_OTHER): Payer: PPO | Admitting: Orthopedic Surgery

## 2019-09-12 ENCOUNTER — Encounter: Payer: Self-pay | Admitting: Orthopedic Surgery

## 2019-09-12 VITALS — BP 106/75 | HR 62 | Ht 61.0 in | Wt 156.0 lb

## 2019-09-12 DIAGNOSIS — M25572 Pain in left ankle and joints of left foot: Secondary | ICD-10-CM

## 2019-09-12 NOTE — Progress Notes (Signed)
Chief Complaint  Patient presents with  . Ankle Pain    left ankle pain, no injury, and has some swelling and is tender to touch.    79 year old female had a ankle fracture treated with ORIF for bimalleolar fracture several years ago developed an infection which was treated with oral antibiotics comes in now with some swelling which she notices after activity seems to be gone in the morning does not complain of any pain  Review of systems no fever no skin rashes or issues around the joint  .pmh  BP 106/75   Pulse 62   Ht 5\' 1"  (1.549 m)   Wt 156 lb (70.8 kg)   BMI 29.48 kg/m   She is ambulating normally she is awake and alert she is oriented x3 mood is pleasant affect is normal  Left ankle medial and lateral incisions are healed barely visible there is no tenderness along either incision or the medial anterior lateral joint line she has good motion and stability  X-ray shows intact hardware stable ankle mortise mild degenerative changes are seen anteriorly and medially  Impression swelling and edema no acute fracture dislocation or infection  Patient advised to elevate the feet and in the evenings no other treatment needed.

## 2019-09-23 DIAGNOSIS — D519 Vitamin B12 deficiency anemia, unspecified: Secondary | ICD-10-CM | POA: Diagnosis not present

## 2019-10-28 DIAGNOSIS — Z23 Encounter for immunization: Secondary | ICD-10-CM | POA: Diagnosis not present

## 2019-10-28 DIAGNOSIS — D519 Vitamin B12 deficiency anemia, unspecified: Secondary | ICD-10-CM | POA: Diagnosis not present

## 2019-12-02 DIAGNOSIS — D519 Vitamin B12 deficiency anemia, unspecified: Secondary | ICD-10-CM | POA: Diagnosis not present

## 2019-12-30 DIAGNOSIS — D519 Vitamin B12 deficiency anemia, unspecified: Secondary | ICD-10-CM | POA: Diagnosis not present

## 2020-01-22 DIAGNOSIS — G9009 Other idiopathic peripheral autonomic neuropathy: Secondary | ICD-10-CM | POA: Diagnosis not present

## 2020-01-22 DIAGNOSIS — M19011 Primary osteoarthritis, right shoulder: Secondary | ICD-10-CM | POA: Diagnosis not present

## 2020-02-03 DIAGNOSIS — D519 Vitamin B12 deficiency anemia, unspecified: Secondary | ICD-10-CM | POA: Diagnosis not present

## 2020-02-16 ENCOUNTER — Emergency Department (HOSPITAL_COMMUNITY)
Admission: EM | Admit: 2020-02-16 | Discharge: 2020-02-17 | Disposition: A | Discharge status: Home / Self Care | Payer: PPO | Attending: Emergency Medicine | Admitting: Emergency Medicine

## 2020-02-16 ENCOUNTER — Other Ambulatory Visit: Payer: Self-pay

## 2020-02-16 ENCOUNTER — Encounter (HOSPITAL_COMMUNITY): Payer: Self-pay | Admitting: *Deleted

## 2020-02-16 DIAGNOSIS — R519 Headache, unspecified: Secondary | ICD-10-CM | POA: Insufficient documentation

## 2020-02-16 DIAGNOSIS — R197 Diarrhea, unspecified: Secondary | ICD-10-CM | POA: Diagnosis not present

## 2020-02-16 DIAGNOSIS — R112 Nausea with vomiting, unspecified: Secondary | ICD-10-CM | POA: Diagnosis not present

## 2020-02-16 DIAGNOSIS — Z7982 Long term (current) use of aspirin: Secondary | ICD-10-CM | POA: Diagnosis not present

## 2020-02-16 DIAGNOSIS — Z96611 Presence of right artificial shoulder joint: Secondary | ICD-10-CM | POA: Diagnosis not present

## 2020-02-16 DIAGNOSIS — R42 Dizziness and giddiness: Secondary | ICD-10-CM

## 2020-02-16 DIAGNOSIS — H814 Vertigo of central origin: Secondary | ICD-10-CM | POA: Diagnosis not present

## 2020-02-16 DIAGNOSIS — R9431 Abnormal electrocardiogram [ECG] [EKG]: Secondary | ICD-10-CM | POA: Diagnosis not present

## 2020-02-16 LAB — BASIC METABOLIC PANEL
Anion gap: 9 (ref 5–15)
BUN: 16 mg/dL (ref 8–23)
CO2: 26 mmol/L (ref 22–32)
Calcium: 9.4 mg/dL (ref 8.9–10.3)
Chloride: 103 mmol/L (ref 98–111)
Creatinine, Ser: 0.65 mg/dL (ref 0.44–1.00)
GFR, Estimated: >60 mL/min (ref >60)
Glucose, Bld: 118 mg/dL — ABNORMAL HIGH (ref 70–99)
Potassium: 4.2 mmol/L (ref 3.5–5.1)
Sodium: 138 mmol/L (ref 135–145)

## 2020-02-16 LAB — CBC
HCT: 41.7 % (ref 36.0–46.0)
Hemoglobin: 14.1 g/dL (ref 12.0–15.0)
MCH: 30.1 pg (ref 26.0–34.0)
MCHC: 33.8 g/dL (ref 30.0–36.0)
MCV: 88.9 fL (ref 80.0–100.0)
Platelets: 205 10*3/uL (ref 150–400)
RBC: 4.69 MIL/uL (ref 3.87–5.11)
RDW: 14.1 % (ref 11.5–15.5)
WBC: 9.6 10*3/uL (ref 4.0–10.5)
nRBC: 0 % (ref 0.0–0.2)

## 2020-02-16 NOTE — ED Triage Notes (Signed)
C/o dizziness with diarrhea onset this am

## 2020-02-17 ENCOUNTER — Emergency Department (HOSPITAL_COMMUNITY): Payer: PPO

## 2020-02-17 DIAGNOSIS — R42 Dizziness and giddiness: Secondary | ICD-10-CM | POA: Diagnosis not present

## 2020-02-17 DIAGNOSIS — R9431 Abnormal electrocardiogram [ECG] [EKG]: Secondary | ICD-10-CM | POA: Diagnosis not present

## 2020-02-17 DIAGNOSIS — R519 Headache, unspecified: Secondary | ICD-10-CM | POA: Diagnosis not present

## 2020-02-17 DIAGNOSIS — H814 Vertigo of central origin: Secondary | ICD-10-CM | POA: Diagnosis not present

## 2020-02-17 LAB — URINALYSIS, ROUTINE W REFLEX MICROSCOPIC
Bilirubin Urine: NEGATIVE
Glucose, UA: NEGATIVE mg/dL
Hgb urine dipstick: NEGATIVE
Ketones, ur: NEGATIVE mg/dL
Leukocytes,Ua: NEGATIVE
Nitrite: NEGATIVE
Protein, ur: NEGATIVE mg/dL
Specific Gravity, Urine: 1.016 (ref 1.005–1.030)
pH: 6 (ref 5.0–8.0)

## 2020-02-17 MED ORDER — ONDANSETRON HCL 4 MG/2ML IJ SOLN
4.0000 mg | Freq: Once | INTRAMUSCULAR | Status: AC
  Administered 2020-02-17: 4 mg via INTRAVENOUS
  Filled 2020-02-17: qty 2

## 2020-02-17 MED ORDER — ONDANSETRON HCL 4 MG PO TABS: 4.0000 mg | ORAL_TABLET | Freq: Four times a day (QID) | ORAL | 0 refills | Status: DC

## 2020-02-17 MED ORDER — SODIUM CHLORIDE 0.9 % IV BOLUS
1000.0000 mL | Freq: Once | INTRAVENOUS | Status: AC
  Administered 2020-02-17: 1000 mL via INTRAVENOUS

## 2020-02-17 MED ORDER — MECLIZINE HCL 12.5 MG PO TABS
25.0000 mg | ORAL_TABLET | Freq: Once | ORAL | Status: AC
  Administered 2020-02-17: 25 mg via ORAL
  Filled 2020-02-17: qty 2

## 2020-02-17 MED ORDER — MECLIZINE HCL 25 MG PO TABS: 25.0000 mg | ORAL_TABLET | Freq: Three times a day (TID) | ORAL | 0 refills | Status: AC | PRN

## 2020-02-17 NOTE — ED Notes (Signed)
Patient transported to CT 

## 2020-02-17 NOTE — ED Provider Notes (Signed)
[facility] Provider Note   CSN: [id number] Arrival date & time: 02/16/20  1540   Time seen 12:15 AM  History No chief complaint on file.   [name] is a 80 y.o. female.  HPI   Patient reports about 2 AM on January 24 she started having dizziness which is a feeling that the room was spinning around her.  She states any movement of her head makes it get worse.  She has had nausea and vomiting over 5 times today.  She also has diarrhea 4-5 times today.  She states she has had more vomiting than diarrhea.  She has a frontal headache and also the back part of her head hurts that started about 11 AM.  She denies abdominal pain, chest pain, shortness of breath, fever, cough, rhinorrhea, or sore throat.  She states she has never had this before.  She denies being around anybody who is sick.  Patient has had the [place] vaccine x3  PCP Asencion Noble, MD   Past Medical History:  Diagnosis Date  . B12 deficiency    managed by Dr. Willey Blade: monthly injections  . Hematuria 05/11/2015  . Hemorrhoids   . Hyperlipemia   . Impaired fasting glucose   . Neuropathy    hereditary and idiopathic  . Osteoarthritis   . Pelvic pressure in female 05/11/2015  . PONV (postoperative nausea and vomiting)   . Transient cerebral ischemic attack    no impairment  . Vaginal atrophy 05/11/2015    Patient Active Problem List   Diagnosis Date Noted  . 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 Colles' fracture of right radius 09/04/17 09/20/2017  . Pelvic pressure in female 05/11/2015  . Hematuria 05/11/2015  . Vaginal atrophy 05/11/2015  . Encounter for screening colonoscopy 10/08/2013  . Ankle arthritis 05/19/2013  . Left ankle strain 05/19/2013  . Left ankle pain 05/19/2013  . PRIMARY LOCALIZED OSTEOARTHROSIS LOWER LEG 04/15/2008  . BUCKET HANDLE TEAR OF LATERAL MENISCUS 04/15/2008  . TEAR LATERAL MENISCUS 04/15/2008  . KNEE, ARTHRITIS, DEGEN./OSTEO 01/08/2008  . DERANGEMENT  MENISCUS 01/08/2008  . LOOSE BODY-KNEE 01/08/2008  . KNEE PAIN 01/08/2008    Past Surgical History:  Procedure Laterality Date  . ABDOMINAL HYSTERECTOMY    . ANKLE FRACTURE SURGERY     left  . APPENDECTOMY    . CATARACT EXTRACTION    . COLONOSCOPY  09/30/2003   RMR: Normal colon. Subtle abnormalities in the ileocecal valve and terminal  ileum as described above of doubtful clinical significance/Normal rectum  . COLONOSCOPY N/A 11/03/2013   Procedure: COLONOSCOPY;  Surgeon: Daneil Dolin, MD;  [facility];  Service: Endoscopy;  Laterality: N/A;  830 - moved to 10:45 - Ginger to notify pt  . REVERSE SHOULDER ARTHROPLASTY Right 07/02/2018  . REVERSE SHOULDER ARTHROPLASTY Right 07/02/2018   Procedure: RIGHT REVERSE SHOULDER ARTHROPLASTY;  Surgeon: Meredith Pel, MD;  Location: [facility];  Service: Orthopedics;  Laterality: Right;  . right leg fracture surgery     right  . TUBAL LIGATION       OB History    Gravida  1   Para  1   Term      Preterm      AB      Living  1     SAB      IAB      Ectopic      Multiple      Live Births  Family History  Problem Relation Age of Onset  . Heart disease Daughter        transposition of great arteries/on fourth pacemaker  . Heart disease Mother   . Glaucoma Mother   . Arthritis Father        rheumatoid  . Alcohol abuse Father   . Diabetes Father   . Glaucoma Sister   . Arthritis Brother        rheumatoid  . Colon cancer Neg Hx   . Colon polyps Neg Hx   . Breast cancer Neg Hx   . Lung cancer Neg Hx     Social History   Tobacco Use  . Smoking status: Never Smoker  . Smokeless tobacco: Never Used  Vaping Use  . Vaping Use: Never used  Substance Use Topics  . Alcohol use: No  . Drug use: No    Home Medications Prior to Admission medications   Medication Sig Start Date End Date Taking? Authorizing Provider  meclizine (ANTIVERT) 25 MG tablet Take 1 tablet (25 mg total) by mouth  3 (three) times daily as needed for dizziness. 02/17/20  Yes Rolland Porter, MD  ondansetron (ZOFRAN) 4 MG tablet Take 1 tablet (4 mg total) by mouth every 6 (six) hours. 02/17/20  Yes Rolland Porter, MD  aspirin EC 81 MG tablet Take 81 mg by mouth daily.    [provider]  Cyanocobalamin (B-12) 1000 MCG/ML KIT Inject 1,000 mcg as directed every 30 (thirty) days.    [provider]  diclofenac (VOLTAREN) 50 MG EC tablet Take 50 mg by mouth daily.     [provider]  Multiple Vitamin (MULTIVITAMIN) capsule Take 1 capsule by mouth daily. Centrum    [provider]  pregabalin (LYRICA) 75 MG capsule Take 75 mg by mouth 2 (two) times daily.    [provider]  simvastatin (ZOCOR) 10 MG tablet Take 10 mg by mouth at bedtime.    [provider]    Allergies    Sulfa antibiotics  Review of Systems   Review of Systems  All other systems reviewed and are negative.   Physical Exam Updated Vital Signs BP (!) 155/76   Pulse (!) 52   Temp 98.2 F (36.8 C) (Oral)   Resp 18   Ht 5' 1"  (1.549 m)   Wt 69.9 kg   SpO2 96%   BMI 29.10 kg/m   Physical Exam Vitals and nursing note reviewed.  Constitutional:      General: She is not in acute distress.    Appearance: Normal appearance. She is normal weight. She is not ill-appearing or toxic-appearing.  HENT:     Head: Normocephalic and atraumatic.     Right Ear: External ear normal.     Left Ear: External ear normal.     Mouth/Throat:     Mouth: Mucous membranes are dry.  Eyes:     Extraocular Movements: Extraocular movements intact.     Conjunctiva/sclera: Conjunctivae normal.     Pupils: Pupils are equal, round, and reactive to light.     Comments: Patient was lying with her head slightly turned to the left, when she turned her head so she was in a more natural position she started complaining of spinning, at that point I thought nystagmus of her eyes.  Cardiovascular:     Rate and Rhythm:  Normal rate and regular rhythm.     Pulses: Normal pulses.     Heart sounds: Normal heart sounds.  Pulmonary:     Effort: Pulmonary effort is normal. No respiratory distress.     Breath sounds: Normal breath sounds.  Musculoskeletal:        General: Normal range of motion.     Cervical back: Normal range of motion.  Skin:    General: Skin is warm and dry.  Neurological:     General: No focal deficit present.     Mental Status: She is alert and oriented to person, place, and time.     Cranial Nerves: No cranial nerve deficit.  Psychiatric:        Mood and Affect: Mood normal.        Behavior: Behavior normal.        Thought Content: Thought content normal.     ED Results / Procedures / Treatments   Labs (all labs ordered are listed, but only abnormal results are displayed) Results for orders placed or performed during the hospital encounter of 08/65/78  Basic metabolic panel  Result Value Ref Range   Sodium 138 135 - 145 mmol/L   Potassium 4.2 3.5 - 5.1 mmol/L   Chloride 103 98 - 111 mmol/L   CO2 26 22 - 32 mmol/L   Glucose, Bld 118 (H) 70 - 99 mg/dL   BUN 16 8 - 23 mg/dL   Creatinine, Ser 0.65 0.44 - 1.00 mg/dL   Calcium 9.4 8.9 - 10.3 mg/dL   GFR, Estimated >60 >60 mL/min   Anion gap 9 5 - 15  CBC  Result Value Ref Range   WBC 9.6 4.0 - 10.5 K/uL   RBC 4.69 3.87 - 5.11 MIL/uL   Hemoglobin 14.1 12.0 - 15.0 g/dL   HCT 41.7 36.0 - 46.0 %   MCV 88.9 80.0 - 100.0 fL   MCH 30.1 26.0 - 34.0 pg   MCHC 33.8 30.0 - 36.0 g/dL   RDW 14.1 11.5 - 15.5 %   Platelets 205 150 - 400 K/uL   nRBC 0.0 0.0 - 0.2 %  Urinalysis, Routine w reflex microscopic Urine, Clean Catch  Result Value Ref Range   Color, Urine YELLOW YELLOW   APPearance CLEAR CLEAR   Specific Gravity, Urine 1.016 1.005 - 1.030   pH 6.0 5.0 - 8.0   Glucose, UA NEGATIVE NEGATIVE mg/dL   Hgb urine dipstick NEGATIVE NEGATIVE   Bilirubin Urine NEGATIVE NEGATIVE   Ketones, ur NEGATIVE NEGATIVE mg/dL   Protein, ur  NEGATIVE NEGATIVE mg/dL   Nitrite NEGATIVE NEGATIVE   Leukocytes,Ua NEGATIVE NEGATIVE   Laboratory interpretation all normal except nonfasting hyperglycemia    EKG EKG Interpretation  Date/Time:  Monday February 16 2020 17:02:28 EST Ventricular Rate:  71 PR Interval:  150 QRS Duration: 86 QT Interval:  402 QTC Calculation: 436 R Axis:   12 Text Interpretation: Sinus rhythm with Premature supraventricular complexes Cannot rule out Anterior infarct , age undetermined Abnormal ECG No STEMI Confirmed by Nanda Quinton [phone]) on 02/16/2020 5:13:13 PM   Radiology CT Head Wo Contrast  Result Date: 02/17/2020 CLINICAL DATA:  Dizziness and headache. EXAM: CT HEAD WITHOUT CONTRAST TECHNIQUE: Contiguous axial images were obtained from the base of the skull through the vertex without intravenous contrast. COMPARISON:  CT head 04/19/2013.  MRI brain 04/19/2013. FINDINGS: Brain: No evidence of acute infarction, hemorrhage, hydrocephalus, extra-axial collection or mass lesion/mass effect. Vascular: No hyperdense vessel or unexpected calcification. Skull: Normal. Negative for fracture or focal lesion. Sinuses/Orbits: No acute finding. Other: None. IMPRESSION: No acute intracranial abnormalities. Electronically Signed  By: Lucienne Capers M.D.   On: 02/17/2020 01:32    Procedures Procedures   Medications Ordered in ED Medications  ondansetron (ZOFRAN) injection 4 mg (has no administration in time range)  sodium chloride 0.9 % bolus 1,000 mL (1,000 mLs Intravenous New Bag/Given 02/17/20 0108)  ondansetron (ZOFRAN) injection 4 mg (4 mg Intravenous Given 02/17/20 0108)  meclizine (ANTIVERT) tablet 25 mg (25 mg Oral Given 02/17/20 0108)    ED Course  I have reviewed the triage vital signs and the nursing notes.  Pertinent labs & imaging results that were available during my care of the patient were reviewed by me and considered in my medical decision making (see chart for details).    MDM  Rules/Calculators/A&P                          Patient appears to be having vertigo, minor movement of her head causes her to have the room starts spinning and get nauseated.  Mildly concern is she is also complaining of headache.  Head CT was done, she was given IV fluids and nausea medication and when able she was given meclizine.````  Recheck 1:10 AM patient states she had just gotten her meclizine, her IV and IV Zofran had already been given.  Recheck at 2:15 AM patient is noted to move her head from left to right and did not have symptoms.  She states she still has some mild nausea but the dizziness is much better.  We discussed her test results which were good.  She at this point can be discharged home.  Patient had stated she had a ENT at [facility] however when I review their records in care everywhere I only see ophthalmology notes.  She states she actually was enrolled in a 3 year study for hearing aids that ends in April.  She is agreeable to following up with a local ears nose and throat doctor.  Final Clinical Impression(s) / ED Diagnoses Final diagnoses:  Vertigo    Rx / DC Orders ED Discharge Orders         Ordered    ondansetron (ZOFRAN) 4 MG tablet  Every 6 hours        02/17/20 0222    meclizine (ANTIVERT) 25 MG tablet  3 times daily PRN        02/17/20 0223         Plan discharge  Rolland Porter, MD, Barbette Or, MD 02/17/20 [id number]

## 2020-02-17 NOTE — Discharge Instructions (Addendum)
No driving until your symptoms have been gone for 2 days.  Please be very careful going up or downstairs, make sure you hold onto the handrails.  Be careful with any activity that you could get hurt if you had a bad dizzy spell.  Take the medications as prescribed.  Your symptoms should improve over the next 24 hours.  However if your symptoms are lingering or lasting more than a week you can be evaluated by Dr. Suszanne Conners, the ENT in [place].

## 2020-02-17 NOTE — ED Notes (Signed)
Pt up to bathroom via wheelchair, returned to bed. Urine sample obtained and sent to lab. Daughter at bedside. NAD noted.

## 2020-02-27 DIAGNOSIS — Z961 Presence of intraocular lens: Secondary | ICD-10-CM | POA: Diagnosis not present

## 2020-03-09 DIAGNOSIS — D519 Vitamin B12 deficiency anemia, unspecified: Secondary | ICD-10-CM | POA: Diagnosis not present

## 2020-04-13 DIAGNOSIS — D519 Vitamin B12 deficiency anemia, unspecified: Secondary | ICD-10-CM | POA: Diagnosis not present

## 2020-04-19 DIAGNOSIS — R42 Dizziness and giddiness: Secondary | ICD-10-CM | POA: Diagnosis not present

## 2020-04-19 DIAGNOSIS — H903 Sensorineural hearing loss, bilateral: Secondary | ICD-10-CM | POA: Diagnosis not present

## 2020-04-21 ENCOUNTER — Other Ambulatory Visit (HOSPITAL_COMMUNITY): Payer: Self-pay | Admitting: Internal Medicine

## 2020-04-21 DIAGNOSIS — Z1231 Encounter for screening mammogram for malignant neoplasm of breast: Secondary | ICD-10-CM

## 2020-05-06 ENCOUNTER — Ambulatory Visit (HOSPITAL_COMMUNITY)
Admission: RE | Admit: 2020-05-06 | Discharge: 2020-05-06 | Disposition: A | Discharge status: Home / Self Care | Payer: PPO | Source: Ambulatory Visit | Attending: Internal Medicine | Admitting: Internal Medicine

## 2020-05-06 ENCOUNTER — Other Ambulatory Visit: Payer: Self-pay

## 2020-05-06 DIAGNOSIS — Z1231 Encounter for screening mammogram for malignant neoplasm of breast: Secondary | ICD-10-CM | POA: Diagnosis not present

## 2020-05-10 DIAGNOSIS — I10 Essential (primary) hypertension: Secondary | ICD-10-CM | POA: Diagnosis not present

## 2020-05-17 DIAGNOSIS — D519 Vitamin B12 deficiency anemia, unspecified: Secondary | ICD-10-CM | POA: Diagnosis not present

## 2020-06-10 DIAGNOSIS — I10 Essential (primary) hypertension: Secondary | ICD-10-CM | POA: Diagnosis not present

## 2020-06-29 DIAGNOSIS — D519 Vitamin B12 deficiency anemia, unspecified: Secondary | ICD-10-CM | POA: Diagnosis not present

## 2020-07-15 DIAGNOSIS — E538 Deficiency of other specified B group vitamins: Secondary | ICD-10-CM | POA: Diagnosis not present

## 2020-07-15 DIAGNOSIS — I10 Essential (primary) hypertension: Secondary | ICD-10-CM | POA: Diagnosis not present

## 2020-07-15 DIAGNOSIS — E785 Hyperlipidemia, unspecified: Secondary | ICD-10-CM | POA: Diagnosis not present

## 2020-07-15 DIAGNOSIS — Z79899 Other long term (current) drug therapy: Secondary | ICD-10-CM | POA: Diagnosis not present

## 2020-07-15 DIAGNOSIS — R7301 Impaired fasting glucose: Secondary | ICD-10-CM | POA: Diagnosis not present

## 2020-07-27 DIAGNOSIS — D519 Vitamin B12 deficiency anemia, unspecified: Secondary | ICD-10-CM | POA: Diagnosis not present

## 2020-08-31 DIAGNOSIS — D519 Vitamin B12 deficiency anemia, unspecified: Secondary | ICD-10-CM | POA: Diagnosis not present

## 2020-09-23 DIAGNOSIS — Z03818 Encounter for observation for suspected exposure to other biological agents ruled out: Secondary | ICD-10-CM | POA: Diagnosis not present

## 2020-09-23 DIAGNOSIS — Z20822 Contact with and (suspected) exposure to covid-19: Secondary | ICD-10-CM | POA: Diagnosis not present

## 2020-10-19 DIAGNOSIS — I10 Essential (primary) hypertension: Secondary | ICD-10-CM | POA: Diagnosis not present

## 2020-10-19 DIAGNOSIS — E785 Hyperlipidemia, unspecified: Secondary | ICD-10-CM | POA: Diagnosis not present

## 2020-10-19 DIAGNOSIS — Z6830 Body mass index (BMI) 30.0-30.9, adult: Secondary | ICD-10-CM | POA: Diagnosis not present

## 2020-10-19 DIAGNOSIS — E875 Hyperkalemia: Secondary | ICD-10-CM | POA: Diagnosis not present

## 2020-10-19 DIAGNOSIS — R7301 Impaired fasting glucose: Secondary | ICD-10-CM | POA: Diagnosis not present

## 2020-10-19 DIAGNOSIS — G629 Polyneuropathy, unspecified: Secondary | ICD-10-CM | POA: Diagnosis not present

## 2020-10-19 DIAGNOSIS — R001 Bradycardia, unspecified: Secondary | ICD-10-CM | POA: Diagnosis not present

## 2020-10-19 DIAGNOSIS — Z23 Encounter for immunization: Secondary | ICD-10-CM | POA: Diagnosis not present

## 2020-10-19 DIAGNOSIS — Z8673 Personal history of transient ischemic attack (TIA), and cerebral infarction without residual deficits: Secondary | ICD-10-CM | POA: Diagnosis not present

## 2020-10-21 DIAGNOSIS — E875 Hyperkalemia: Secondary | ICD-10-CM | POA: Diagnosis not present

## 2020-11-02 DIAGNOSIS — D519 Vitamin B12 deficiency anemia, unspecified: Secondary | ICD-10-CM | POA: Diagnosis not present

## 2020-11-22 DIAGNOSIS — E785 Hyperlipidemia, unspecified: Secondary | ICD-10-CM | POA: Diagnosis not present

## 2020-11-22 DIAGNOSIS — G459 Transient cerebral ischemic attack, unspecified: Secondary | ICD-10-CM | POA: Diagnosis not present

## 2020-12-14 DIAGNOSIS — D519 Vitamin B12 deficiency anemia, unspecified: Secondary | ICD-10-CM | POA: Diagnosis not present

## 2020-12-22 DIAGNOSIS — E785 Hyperlipidemia, unspecified: Secondary | ICD-10-CM | POA: Diagnosis not present

## 2020-12-22 DIAGNOSIS — G459 Transient cerebral ischemic attack, unspecified: Secondary | ICD-10-CM | POA: Diagnosis not present

## 2021-01-25 DIAGNOSIS — D519 Vitamin B12 deficiency anemia, unspecified: Secondary | ICD-10-CM | POA: Diagnosis not present

## 2021-02-18 DIAGNOSIS — G629 Polyneuropathy, unspecified: Secondary | ICD-10-CM | POA: Diagnosis not present

## 2021-02-18 DIAGNOSIS — I10 Essential (primary) hypertension: Secondary | ICD-10-CM | POA: Diagnosis not present

## 2021-02-25 DIAGNOSIS — D519 Vitamin B12 deficiency anemia, unspecified: Secondary | ICD-10-CM | POA: Diagnosis not present

## 2021-03-03 DIAGNOSIS — Z961 Presence of intraocular lens: Secondary | ICD-10-CM | POA: Diagnosis not present

## 2021-03-03 DIAGNOSIS — H52203 Unspecified astigmatism, bilateral: Secondary | ICD-10-CM | POA: Diagnosis not present

## 2021-03-03 DIAGNOSIS — H5213 Myopia, bilateral: Secondary | ICD-10-CM | POA: Diagnosis not present

## 2021-03-03 DIAGNOSIS — H524 Presbyopia: Secondary | ICD-10-CM | POA: Diagnosis not present

## 2021-03-03 DIAGNOSIS — H40013 Open angle with borderline findings, low risk, bilateral: Secondary | ICD-10-CM | POA: Diagnosis not present

## 2021-03-22 DIAGNOSIS — G459 Transient cerebral ischemic attack, unspecified: Secondary | ICD-10-CM | POA: Diagnosis not present

## 2021-03-22 DIAGNOSIS — G609 Hereditary and idiopathic neuropathy, unspecified: Secondary | ICD-10-CM | POA: Diagnosis not present

## 2021-03-22 DIAGNOSIS — E785 Hyperlipidemia, unspecified: Secondary | ICD-10-CM | POA: Diagnosis not present

## 2021-03-29 DIAGNOSIS — D519 Vitamin B12 deficiency anemia, unspecified: Secondary | ICD-10-CM | POA: Diagnosis not present

## 2021-04-04 ENCOUNTER — Other Ambulatory Visit (HOSPITAL_COMMUNITY): Payer: Self-pay | Admitting: Internal Medicine

## 2021-04-04 DIAGNOSIS — Z1231 Encounter for screening mammogram for malignant neoplasm of breast: Secondary | ICD-10-CM

## 2021-05-03 DIAGNOSIS — D519 Vitamin B12 deficiency anemia, unspecified: Secondary | ICD-10-CM | POA: Diagnosis not present

## 2021-05-11 ENCOUNTER — Ambulatory Visit (HOSPITAL_COMMUNITY): Payer: PPO

## 2021-05-12 ENCOUNTER — Ambulatory Visit (HOSPITAL_COMMUNITY)
Admission: RE | Admit: 2021-05-12 | Discharge: 2021-05-12 | Disposition: A | Discharge status: Home / Self Care | Payer: PPO | Source: Ambulatory Visit | Attending: Internal Medicine | Admitting: Internal Medicine

## 2021-05-12 DIAGNOSIS — Z1231 Encounter for screening mammogram for malignant neoplasm of breast: Secondary | ICD-10-CM | POA: Diagnosis not present

## 2021-06-07 DIAGNOSIS — D519 Vitamin B12 deficiency anemia, unspecified: Secondary | ICD-10-CM | POA: Diagnosis not present

## 2021-06-14 DIAGNOSIS — I10 Essential (primary) hypertension: Secondary | ICD-10-CM | POA: Diagnosis not present

## 2021-06-14 DIAGNOSIS — J189 Pneumonia, unspecified organism: Secondary | ICD-10-CM | POA: Diagnosis not present

## 2021-07-07 DIAGNOSIS — D519 Vitamin B12 deficiency anemia, unspecified: Secondary | ICD-10-CM | POA: Diagnosis not present

## 2021-08-16 DIAGNOSIS — D519 Vitamin B12 deficiency anemia, unspecified: Secondary | ICD-10-CM | POA: Diagnosis not present

## 2021-09-20 DIAGNOSIS — D519 Vitamin B12 deficiency anemia, unspecified: Secondary | ICD-10-CM | POA: Diagnosis not present

## 2021-09-22 DIAGNOSIS — G459 Transient cerebral ischemic attack, unspecified: Secondary | ICD-10-CM | POA: Diagnosis not present

## 2021-09-22 DIAGNOSIS — E785 Hyperlipidemia, unspecified: Secondary | ICD-10-CM | POA: Diagnosis not present

## 2021-10-18 DIAGNOSIS — D519 Vitamin B12 deficiency anemia, unspecified: Secondary | ICD-10-CM | POA: Diagnosis not present

## 2021-10-24 DIAGNOSIS — I10 Essential (primary) hypertension: Secondary | ICD-10-CM | POA: Diagnosis not present

## 2021-10-24 DIAGNOSIS — G909 Disorder of the autonomic nervous system, unspecified: Secondary | ICD-10-CM | POA: Diagnosis not present

## 2021-10-24 DIAGNOSIS — E785 Hyperlipidemia, unspecified: Secondary | ICD-10-CM | POA: Diagnosis not present

## 2021-10-24 DIAGNOSIS — Z79899 Other long term (current) drug therapy: Secondary | ICD-10-CM | POA: Diagnosis not present

## 2021-10-31 DIAGNOSIS — E785 Hyperlipidemia, unspecified: Secondary | ICD-10-CM | POA: Diagnosis not present

## 2021-10-31 DIAGNOSIS — R000 Tachycardia, unspecified: Secondary | ICD-10-CM | POA: Diagnosis not present

## 2021-10-31 DIAGNOSIS — I10 Essential (primary) hypertension: Secondary | ICD-10-CM | POA: Diagnosis not present

## 2021-10-31 DIAGNOSIS — N390 Urinary tract infection, site not specified: Secondary | ICD-10-CM | POA: Diagnosis not present

## 2021-10-31 DIAGNOSIS — R7309 Other abnormal glucose: Secondary | ICD-10-CM | POA: Diagnosis not present

## 2021-11-07 ENCOUNTER — Ambulatory Visit
Admission: EM | Admit: 2021-11-07 | Discharge: 2021-11-07 | Disposition: A | Discharge status: Home / Self Care | Payer: PPO | Attending: Family Medicine | Admitting: Family Medicine

## 2021-11-07 ENCOUNTER — Encounter: Payer: Self-pay | Admitting: Emergency Medicine

## 2021-11-07 DIAGNOSIS — M545 Low back pain, unspecified: Secondary | ICD-10-CM | POA: Diagnosis not present

## 2021-11-07 LAB — POCT URINALYSIS DIP (MANUAL ENTRY)
Blood, UA: NEGATIVE
Glucose, UA: NEGATIVE mg/dL
Leukocytes, UA: NEGATIVE
Nitrite, UA: NEGATIVE
Protein Ur, POC: 30 mg/dL — AB
Spec Grav, UA: >=1.03 — AB (ref 1.010–1.025)
Urobilinogen, UA: 0.2 E.U./dL
pH, UA: 5 (ref 5.0–8.0)

## 2021-11-07 MED ORDER — TIZANIDINE HCL 2 MG PO CAPS: 2.0000 mg | ORAL_CAPSULE | Freq: Three times a day (TID) | ORAL | 0 refills | Status: AC | PRN

## 2021-11-07 MED ORDER — METHYLPREDNISOLONE SODIUM SUCC 125 MG IJ SOLR
60.0000 mg | Freq: Once | INTRAMUSCULAR | Status: AC
  Administered 2021-11-07: 60 mg via INTRAMUSCULAR

## 2021-11-07 NOTE — ED Triage Notes (Signed)
Pt reports bladder pressure, back pain and bilateral leg pain. States right leg hurts more. Reports going to see PCP and was given a rx for a UTI x 5 days. States feels like medication was not effective.

## 2021-11-07 NOTE — ED Provider Notes (Signed)
[facility]    CSN: [id number] Arrival date & time: 11/07/21  1217      History   Chief Complaint Chief Complaint  Patient presents with   Bladder Pressure   Back Pain    HPI Morgan HENTON is a 81 y.o. female.   Patient presenting today with several day history of suprapubic pressure, right-sided low back pain, right hip aching and pain down the back of her right leg.  Denies any known injury, heavy lifting, dysuria, hematuria, nausea, vomiting, diarrhea, constipation.  So far trying Tylenol with no relief.  Denies any history of urinary tract infections, though discussed the suprapubic pressure with her PCP last week and her physical and states capsules were sent in for 5 days in case of possible urinary tract infection though no urine sample was obtained at this visit.  She states these capsules did not help with her symptoms.   Past Medical History:  Diagnosis Date   B12 deficiency    managed by Dr. Willey Blade: monthly injections   Hematuria 05/11/2015   Hemorrhoids    Hyperlipemia    Impaired fasting glucose    Neuropathy    hereditary and idiopathic   Osteoarthritis    Pelvic pressure in female 05/11/2015   PONV (postoperative nausea and vomiting)    Transient cerebral ischemic attack    no impairment   Vaginal atrophy 05/11/2015    Patient Active Problem List   Diagnosis Date Noted   Shoulder arthritis 07/02/2018   Colles' fracture of right radius 09/04/17 09/20/2017   Pelvic pressure in female 05/11/2015   Hematuria 05/11/2015   Vaginal atrophy 05/11/2015   Encounter for screening colonoscopy 10/08/2013   Ankle arthritis 05/19/2013   Left ankle strain 05/19/2013   Left ankle pain 05/19/2013   PRIMARY LOCALIZED OSTEOARTHROSIS LOWER LEG 04/15/2008   BUCKET HANDLE TEAR OF LATERAL MENISCUS 04/15/2008   TEAR LATERAL MENISCUS 04/15/2008   KNEE, ARTHRITIS, DEGEN./OSTEO 01/08/2008   DERANGEMENT MENISCUS 01/08/2008   LOOSE BODY-KNEE 01/08/2008   KNEE PAIN  01/08/2008    Past Surgical History:  Procedure Laterality Date   ABDOMINAL HYSTERECTOMY     ANKLE FRACTURE SURGERY     left   APPENDECTOMY     CATARACT EXTRACTION     COLONOSCOPY  09/30/2003   RMR: Normal colon. Subtle abnormalities in the ileocecal valve and terminal  ileum as described above of doubtful clinical significance/Normal rectum   COLONOSCOPY N/A 11/03/2013   Procedure: COLONOSCOPY;  Surgeon: Daneil Dolin, MD;  [facility];  Service: Endoscopy;  Laterality: N/A;  830 - moved to 10:45 - Ginger to notify pt   REVERSE SHOULDER ARTHROPLASTY Right 07/02/2018   REVERSE SHOULDER ARTHROPLASTY Right 07/02/2018   Procedure: RIGHT REVERSE SHOULDER ARTHROPLASTY;  Surgeon: Meredith Pel, MD;  Location: [facility];  Service: Orthopedics;  Laterality: Right;   right leg fracture surgery     right   TUBAL LIGATION      OB History     Gravida  1   Para  1   Term      Preterm      AB      Living  1      SAB      IAB      Ectopic      Multiple      Live Births               Home Medications    Prior to Admission  medications   Medication Sig Start Date End Date Taking? Authorizing Provider  tizanidine (ZANAFLEX) 2 MG capsule Take 1 capsule (2 mg total) by mouth 3 (three) times daily as needed for muscle spasms. Do not drink alcohol or drive while taking this medication.  May cause drowsiness. 11/07/21  Yes Volney American, PA-C  aspirin EC 81 MG tablet Take 81 mg by mouth daily.    [provider]  Cyanocobalamin (B-12) 1000 MCG/ML KIT Inject 1,000 mcg as directed every 30 (thirty) days.    [provider]  diclofenac (VOLTAREN) 50 MG EC tablet Take 50 mg by mouth daily.     [provider]  meclizine (ANTIVERT) 25 MG tablet Take 1 tablet (25 mg total) by mouth 3 (three) times daily as needed for dizziness. 02/17/20   Rolland Porter, MD  Multiple Vitamin (MULTIVITAMIN) capsule Take 1 capsule by mouth daily. Centrum     [provider]  ondansetron (ZOFRAN) 4 MG tablet Take 1 tablet (4 mg total) by mouth every 6 (six) hours. 02/17/20   Rolland Porter, MD  pregabalin (LYRICA) 75 MG capsule Take 75 mg by mouth 2 (two) times daily.    [provider]  simvastatin (ZOCOR) 10 MG tablet Take 10 mg by mouth at bedtime.    [provider]    Family History Family History  Problem Relation Age of Onset   Heart disease Daughter        transposition of great arteries/on fourth pacemaker   Heart disease Mother    Glaucoma Mother    Arthritis Father        rheumatoid   Alcohol abuse Father    Diabetes Father    Glaucoma Sister    Arthritis Brother        rheumatoid   Colon cancer Neg Hx    Colon polyps Neg Hx    Breast cancer Neg Hx    Lung cancer Neg Hx     Social History Social History   Tobacco Use   Smoking status: Never   Smokeless tobacco: Never  Vaping Use   Vaping Use: Never used  Substance Use Topics   Alcohol use: No   Drug use: No     Allergies   Sulfa antibiotics   Review of Systems Review of Systems Per HPI  Physical Exam Triage Vital Signs ED Triage Vitals  Enc Vitals Group     BP 11/07/21 1237 113/75     Pulse Rate 11/07/21 1237 (!) 56     Resp 11/07/21 1237 18     Temp 11/07/21 1237 (!) 97.1 F (36.2 C)     Temp Source 11/07/21 1237 Oral     SpO2 11/07/21 1237 95 %     Weight --      Height --      Head Circumference --      Peak Flow --      Pain Score 11/07/21 1235 7     Pain Loc --      Pain Edu? --      Excl. in [place]? --    No data found.  Updated Vital Signs BP 113/75 (BP Location: Right Arm)   Pulse (!) 56   Temp (!) 97.1 F (36.2 C) (Oral)   Resp 18   SpO2 95%   Visual Acuity Right Eye Distance:   Left Eye Distance:   Bilateral Distance:    Right Eye Near:   Left Eye Near:    Bilateral  Near:     Physical Exam Vitals and nursing note reviewed.  Constitutional:      Appearance: Normal appearance. She is not  ill-appearing.  HENT:     Head: Atraumatic.     Mouth/Throat:     Mouth: Mucous membranes are moist.  Eyes:     Extraocular Movements: Extraocular movements intact.     Conjunctiva/sclera: Conjunctivae normal.  Cardiovascular:     Rate and Rhythm: Normal rate and regular rhythm.     Heart sounds: Normal heart sounds.  Pulmonary:     Effort: Pulmonary effort is normal.     Breath sounds: Normal breath sounds.  Abdominal:     General: Bowel sounds are normal. There is no distension.     Palpations: Abdomen is soft.     Tenderness: There is no abdominal tenderness. There is no right CVA tenderness, left CVA tenderness or guarding.  Musculoskeletal:        General: Tenderness present. Normal range of motion.     Cervical back: Normal range of motion and neck supple.     Comments: Mild right lateral lumbar musculature tenderness to palpation, no midline spinal tenderness to palpation diffusely, negative straight leg raise bilaterally.  Normal gait and range of motion.  Tender to palpation over right lateral hip  Skin:    General: Skin is warm and dry.  Neurological:     Mental Status: She is alert and oriented to person, place, and time.     Motor: No weakness.     Gait: Gait normal.  Psychiatric:        Mood and Affect: Mood normal.        Thought Content: Thought content normal.        Judgment: Judgment normal.    UC Treatments / Results  Labs (all labs ordered are listed, but only abnormal results are displayed) Labs Reviewed  POCT URINALYSIS DIP (MANUAL ENTRY) - Abnormal; Notable for the following components:      Result Value   Bilirubin, UA small (*)    Ketones, POC UA trace (5) (*)    Spec Grav, UA >=1.030 (*)    Protein Ur, POC =30 (*)    All other components within normal limits    EKG  Radiology No results found.  Procedures Procedures (including critical care time)  Medications Ordered in UC Medications  methylPREDNISolone sodium succinate (SOLU-MEDROL)  125 mg/2 mL injection 60 mg (60 mg Intramuscular Given 11/07/21 1402)    Initial Impression / Assessment and Plan / UC Course  I have reviewed the triage vital signs and the nursing notes.  Pertinent labs & imaging results that were available during my care of the patient were reviewed by me and considered in my medical decision making (see chart for details).     Urinalysis without evidence of UTI today, suspect more muscular/inflammatory in nature regarding her back pain.  She is already taking diclofenac once daily and tapering herself off of it per her doctor's recommendations, will treat with IM Solu-Medrol in clinic and Zanaflex, heat, massage, stretches.  Follow-up with PCP next week for recheck.  Final Clinical Impressions(s) / UC Diagnoses   Final diagnoses:  Acute right-sided low back pain without sciatica   Discharge Instructions   None    ED Prescriptions     Medication Sig Dispense Auth. Provider   tizanidine (ZANAFLEX) 2 MG capsule Take 1 capsule (2 mg total) by mouth 3 (three) times daily as needed for muscle spasms. Do  not drink alcohol or drive while taking this medication.  May cause drowsiness. 15 capsule Volney American, [place]      PDMP not reviewed this encounter.   Volney American, [place] 11/07/21 1437

## 2021-11-14 DIAGNOSIS — M5430 Sciatica, unspecified side: Secondary | ICD-10-CM | POA: Diagnosis not present

## 2021-11-14 DIAGNOSIS — Z6831 Body mass index (BMI) 31.0-31.9, adult: Secondary | ICD-10-CM | POA: Diagnosis not present

## 2021-11-14 DIAGNOSIS — E669 Obesity, unspecified: Secondary | ICD-10-CM | POA: Diagnosis not present

## 2021-11-14 DIAGNOSIS — M545 Low back pain, unspecified: Secondary | ICD-10-CM | POA: Diagnosis not present

## 2021-11-28 ENCOUNTER — Ambulatory Visit (INDEPENDENT_AMBULATORY_CARE_PROVIDER_SITE_OTHER): Payer: PPO | Admitting: Orthopedic Surgery

## 2021-11-28 ENCOUNTER — Ambulatory Visit (INDEPENDENT_AMBULATORY_CARE_PROVIDER_SITE_OTHER): Payer: PPO

## 2021-11-28 ENCOUNTER — Encounter: Payer: Self-pay | Admitting: Orthopedic Surgery

## 2021-11-28 VITALS — BP 130/61 | HR 62 | Ht 60.0 in | Wt 156.0 lb

## 2021-11-28 DIAGNOSIS — M545 Low back pain, unspecified: Secondary | ICD-10-CM | POA: Diagnosis not present

## 2021-11-28 DIAGNOSIS — M25551 Pain in right hip: Secondary | ICD-10-CM

## 2021-11-28 DIAGNOSIS — M1610 Unilateral primary osteoarthritis, unspecified hip: Secondary | ICD-10-CM

## 2021-11-28 DIAGNOSIS — M25552 Pain in left hip: Secondary | ICD-10-CM | POA: Diagnosis not present

## 2021-11-28 DIAGNOSIS — M5410 Radiculopathy, site unspecified: Secondary | ICD-10-CM | POA: Diagnosis not present

## 2021-11-28 DIAGNOSIS — M48062 Spinal stenosis, lumbar region with neurogenic claudication: Secondary | ICD-10-CM | POA: Diagnosis not present

## 2021-11-28 NOTE — Progress Notes (Signed)
Office Visit Note   Patient: [name]           Date of Birth: 1940-04-19           MRN: [id number] Visit Date: 11/28/2021              Requested by: Asencion Noble, MD [address] [place],   [address] PCP: Asencion Noble, MD   Assessment & Plan:  81 year old female with bilateral hip pain which radiates into the groin and more significant bilateral leg pain runs down both legs.  X-rays show severe lumbar arthrosis with spinal stenosis and mild OA of both hips  Recommend nonoperative treatment   Visit Diagnoses:  1. Bilateral hip pain   2. Lumbar pain   3. Radicular pain of both lower extremities     Plan:  IB 800 BID PT 8 Wks fu   Follow-Up Instructions: No follow-ups on file.   Orders:  Orders Placed This Encounter  Procedures   DG Lumbar Spine 2-3 Views   DG Pelvis 1-2 Views   No orders of the defined types were placed in this encounter.     Procedures: No procedures performed   Clinical Data: No additional findings.   Subjective: Chief Complaint  Patient presents with   Left Leg - Pain    Pain runs down back of leg and pain in groin   Right Leg - Pain    Pain runs down leg and goes into groin    81 year old female presents with bilateral hip pain bilateral leg pain and some back pain no history of trauma.  No history of prior surgeries  Patient was treated with 1 IM injection of steroids a 6-day Dosepak and she took 1 dose of ibuprofen  She is very active performing tai chi and [facility]  She denies any bowel or bladder dysfunction    Review of Systems  All other systems reviewed and are negative.    Objective: Vital Signs: BP 130/61   Pulse 62   Ht 5' (1.524 m)   Wt 156 lb (70.8 kg)   BMI 30.47 kg/m   Physical Exam Vitals and nursing note reviewed.  Constitutional:      General: She is not in acute distress.    Appearance: Normal appearance. She is not ill-appearing,  toxic-appearing or diaphoretic.  HENT:     Head: Normocephalic and atraumatic.     Nose: Nose normal. No congestion or rhinorrhea.  Eyes:     General: No scleral icterus.       Right eye: No discharge.        Left eye: No discharge.     Extraocular Movements: Extraocular movements intact.     Conjunctiva/sclera: Conjunctivae normal.     Pupils: Pupils are equal, round, and reactive to light.  Cardiovascular:     Pulses: Normal pulses.  Pulmonary:     Effort: Pulmonary effort is normal.     Breath sounds: No wheezing.  Musculoskeletal:     Lumbar back: Negative right straight leg raise test and negative left straight leg raise test.     Comments: Her back pain is very minimal.  She has no pain with range of motion of the left hip mild groin pain with range of motion of the right hip  Straight leg raises are normal  Skin:    General: Skin is warm and dry.     Capillary Refill: Capillary refill takes less than 2  seconds.     Coloration: Skin is not jaundiced.     Findings: No erythema.  Neurological:     General: No focal deficit present.     Mental Status: She is alert and oriented to person, place, and time.     Sensory: No sensory deficit.     Motor: No weakness.     Coordination: Coordination normal.     Gait: Gait normal.     Deep Tendon Reflexes: Reflexes normal.  Psychiatric:        Mood and Affect: Mood normal.        Behavior: Behavior normal.        Thought Content: Thought content normal.        Judgment: Judgment normal.     Right Ankle Exam   Muscle Strength  The patient has normal right ankle strength.    Left Ankle Exam   Muscle Strength  The patient has normal left ankle strength.   Back Exam   Tenderness  The patient is experiencing tenderness in the lumbar (Mild).  Range of Motion  Extension:  abnormal  Flexion:  abnormal  Lateral bend left:  abnormal  Rotation left:  abnormal   Tests  Straight leg raise right: negative Straight leg  raise left: negative      Specialty Comments:  No specialty comments available.  Imaging: No results found.   PMFS History: Patient Active Problem List   Diagnosis Date Noted   Shoulder arthritis 07/02/2018   Colles' fracture of right radius 09/04/17 09/20/2017   Pelvic pressure in female 05/11/2015   Hematuria 05/11/2015   Vaginal atrophy 05/11/2015   Encounter for screening colonoscopy 10/08/2013   Ankle arthritis 05/19/2013   Left ankle strain 05/19/2013   Left ankle pain 05/19/2013   PRIMARY LOCALIZED OSTEOARTHROSIS LOWER LEG 04/15/2008   BUCKET HANDLE TEAR OF LATERAL MENISCUS 04/15/2008   TEAR LATERAL MENISCUS 04/15/2008   KNEE, ARTHRITIS, DEGEN./OSTEO 01/08/2008   DERANGEMENT MENISCUS 01/08/2008   LOOSE BODY-KNEE 01/08/2008   KNEE PAIN 01/08/2008   Past Medical History:  Diagnosis Date   B12 deficiency    managed by Dr. Willey Blade: monthly injections   Hematuria 05/11/2015   Hemorrhoids    Hyperlipemia    Impaired fasting glucose    Neuropathy    hereditary and idiopathic   Osteoarthritis    Pelvic pressure in female 05/11/2015   PONV (postoperative nausea and vomiting)    Transient cerebral ischemic attack    no impairment   Vaginal atrophy 05/11/2015    Family History  Problem Relation Age of Onset   Heart disease Daughter        transposition of great arteries/on fourth pacemaker   Heart disease Mother    Glaucoma Mother    Arthritis Father        rheumatoid   Alcohol abuse Father    Diabetes Father    Glaucoma Sister    Arthritis Brother        rheumatoid   Colon cancer Neg Hx    Colon polyps Neg Hx    Breast cancer Neg Hx    Lung cancer Neg Hx     Past Surgical History:  Procedure Laterality Date   ABDOMINAL HYSTERECTOMY     ANKLE FRACTURE SURGERY     left   APPENDECTOMY     CATARACT EXTRACTION     COLONOSCOPY  09/30/2003   RMR: Normal colon. Subtle abnormalities in the ileocecal valve and terminal  ileum as described above of  doubtful  clinical significance/Normal rectum   COLONOSCOPY N/A 11/03/2013   Procedure: COLONOSCOPY;  Surgeon: Daneil Dolin, MD;  [facility];  Service: Endoscopy;  Laterality: N/A;  830 - moved to 10:45 - Ginger to notify pt   REVERSE SHOULDER ARTHROPLASTY Right 07/02/2018   REVERSE SHOULDER ARTHROPLASTY Right 07/02/2018   Procedure: RIGHT REVERSE SHOULDER ARTHROPLASTY;  Surgeon: Meredith Pel, MD;  Location: [place];  Service: Orthopedics;  Laterality: Right;   right leg fracture surgery     right   TUBAL LIGATION     Social History   Occupational History   Not on file  Tobacco Use   Smoking status: Never   Smokeless tobacco: Never  Vaping Use   Vaping Use: Never used  Substance and Sexual Activity   Alcohol use: No   Drug use: No   Sexual activity: Not Currently    Birth control/protection: Surgical    Comment: hyst

## 2021-11-29 ENCOUNTER — Telehealth: Payer: Self-pay | Admitting: Radiology

## 2021-11-29 ENCOUNTER — Other Ambulatory Visit: Payer: Self-pay | Admitting: Orthopedic Surgery

## 2021-11-29 MED ORDER — IBUPROFEN 800 MG PO TABS: 800.0000 mg | ORAL_TABLET | Freq: Three times a day (TID) | ORAL | 1 refills | Status: DC | PRN

## 2021-11-29 NOTE — Telephone Encounter (Signed)
Patient came by and said that her Rx that Dr Aline Brochure was going to send in, did not go to [facility]. Please send in?  Thanks.

## 2021-11-29 NOTE — Progress Notes (Signed)
Meds ordered this encounter  Medications   ibuprofen (ADVIL) 800 MG tablet    Sig: Take 1 tablet (800 mg total) by mouth every 8 (eight) hours as needed.    Dispense:  90 tablet    Refill:  1    

## 2021-12-20 DIAGNOSIS — D519 Vitamin B12 deficiency anemia, unspecified: Secondary | ICD-10-CM | POA: Diagnosis not present

## 2021-12-26 NOTE — Therapy (Incomplete)
OUTPATIENT PHYSICAL THERAPY THORACOLUMBAR EVALUATION   Patient Name: RIA REDCAY MRN: [id number] DOB:10-Mar-1940, 81 y.o., female Today's Date: 12/26/2021  END OF SESSION:   Past Medical History:  Diagnosis Date   B12 deficiency    managed by Dr. Ouida Sills: monthly injections   Hematuria 05/11/2015   Hemorrhoids    Hyperlipemia    Impaired fasting glucose    Neuropathy    hereditary and idiopathic   Osteoarthritis    Pelvic pressure in female 05/11/2015   PONV (postoperative nausea and vomiting)    Transient cerebral ischemic attack    no impairment   Vaginal atrophy 05/11/2015   Past Surgical History:  Procedure Laterality Date   ABDOMINAL HYSTERECTOMY     ANKLE FRACTURE SURGERY     left   APPENDECTOMY     CATARACT EXTRACTION     COLONOSCOPY  09/30/2003   RMR: Normal colon. Subtle abnormalities in the ileocecal valve and terminal  ileum as described above of doubtful clinical significance/Normal rectum   COLONOSCOPY N/A 11/03/2013   Procedure: COLONOSCOPY;  Surgeon: Corbin Ade, MD;  [facility];  Service: Endoscopy;  Laterality: N/A;  830 - moved to 10:45 - Ginger to notify pt   REVERSE SHOULDER ARTHROPLASTY Right 07/02/2018   REVERSE SHOULDER ARTHROPLASTY Right 07/02/2018   Procedure: RIGHT REVERSE SHOULDER ARTHROPLASTY;  Surgeon: Cammy Copa, MD;  Location: [facility];  Service: Orthopedics;  Laterality: Right;   right leg fracture surgery     right   TUBAL LIGATION     Patient Active Problem List   Diagnosis Date Noted   Shoulder arthritis 07/02/2018   Colles' fracture of right radius 09/04/17 09/20/2017   Pelvic pressure in female 05/11/2015   Hematuria 05/11/2015   Vaginal atrophy 05/11/2015   Encounter for screening colonoscopy 10/08/2013   Ankle arthritis 05/19/2013   Left ankle strain 05/19/2013   Left ankle pain 05/19/2013   PRIMARY LOCALIZED OSTEOARTHROSIS LOWER LEG 04/15/2008   BUCKET HANDLE TEAR OF LATERAL MENISCUS 04/15/2008   TEAR  LATERAL MENISCUS 04/15/2008   KNEE, ARTHRITIS, DEGEN./OSTEO 01/08/2008   DERANGEMENT MENISCUS 01/08/2008   LOOSE BODY-KNEE 01/08/2008   KNEE PAIN 01/08/2008    PCP: Dr. Carylon Perches  REFERRING PROVIDER: Vickki Hearing, [facility]  REFERRING DIAG: [phone] (ICD-10-CM) - Bilateral hip pain M54.50 (ICD-10-CM) - Lumbar pain M54.10 (ICD-10-CM) - Radicular pain of both lower extremities  Rationale for Evaluation and Treatment: Rehabilitation  THERAPY DIAG:  No diagnosis found.  ONSET DATE: ***  SUBJECTIVE:  SUBJECTIVE STATEMENT: ***  PERTINENT HISTORY:  ***  PAIN:  Are you having pain? {OPRCPAIN:27236}  PRECAUTIONS: {Therapy precautions:24002}  WEIGHT BEARING RESTRICTIONS: {Yes ***/No:24003}  FALLS:  Has patient fallen in last 6 months? {fallsyesno:27318}  LIVING ENVIRONMENT: Lives with: {OPRC lives with:25569::"lives with their family"} Lives in: {Lives in:25570} Stairs: {opstairs:27293} Has following equipment at home: {Assistive devices:23999}  OCCUPATION: ***  PLOF: {PLOF:24004}  PATIENT GOALS: ***  NEXT MD VISIT:   OBJECTIVE:   DIAGNOSTIC FINDINGS:  ***  PATIENT SURVEYS:  {rehab surveys:24030}  SCREENING FOR RED FLAGS: Bowel or bladder incontinence: {Yes/No:304960894} Spinal tumors: {Yes/No:304960894} Cauda equina syndrome: {Yes/No:304960894} Compression fracture: {Yes/No:304960894} Abdominal aneurysm: {Yes/No:304960894}  COGNITION: Overall cognitive status: {cognition:24006}     SENSATION: {sensation:27233}  MUSCLE LENGTH: Hamstrings: Right *** deg; Left *** deg Thomas test: Right *** deg; Left *** deg  POSTURE: {posture:25561}  PALPATION: ***  LUMBAR ROM:   AROM eval  Flexion   Extension   Right lateral flexion    Left lateral flexion   Right rotation   Left rotation    (Blank rows = not tested)  LOWER EXTREMITY ROM:     {AROM/PROM:27142}  Right eval Left eval  Hip flexion    Hip extension    Hip abduction    Hip adduction    Hip internal rotation    Hip external rotation    Knee flexion    Knee extension    Ankle dorsiflexion    Ankle plantarflexion    Ankle inversion    Ankle eversion     (Blank rows = not tested)  LOWER EXTREMITY MMT:    MMT Right eval Left eval  Hip flexion    Hip extension    Hip abduction    Hip adduction    Hip internal rotation    Hip external rotation    Knee flexion    Knee extension    Ankle dorsiflexion    Ankle plantarflexion    Ankle inversion    Ankle eversion     (Blank rows = not tested)  LUMBAR SPECIAL TESTS:  {lumbar special test:25242}  FUNCTIONAL TESTS:  {Functional tests:24029}  GAIT: Distance walked: *** Assistive device utilized: {Assistive devices:23999} Level of assistance: {Levels of assistance:24026} Comments: ***  TODAY'S TREATMENT:                                                                                                                              DATE: 12/28/21 physical therapy evaluation and HEP instruction    PATIENT EDUCATION:  Education details: Patient educated on exam findings, POC, scope of PT, HEP, and ***. Person educated: Patient Education method: Explanation, Demonstration, and Handouts Education comprehension: verbalized understanding, returned demonstration, verbal cues required, and tactile cues required   HOME EXERCISE PROGRAM: ***  ASSESSMENT:  CLINICAL IMPRESSION: Patient is a *** y.o. *** who was seen today for physical therapy evaluation and treatment for ***.   OBJECTIVE IMPAIRMENTS: {opptimpairments:25111}.   ACTIVITY LIMITATIONS: {  activitylimitations:27494}  PARTICIPATION LIMITATIONS: {participationrestrictions:25113}  PERSONAL FACTORS: {Personal factors:25162} are also  affecting patient's functional outcome.   REHAB POTENTIAL: Good  CLINICAL DECISION MAKING: Stable/uncomplicated  EVALUATION COMPLEXITY: Low   GOALS: Goals reviewed with patient? No  SHORT TERM GOALS: Target date: ***  Patient will be independent with initial HEP   Baseline: Goal status: INITIAL  2.  *** Baseline:  Goal status: {GOALSTATUS:25110}  3.  *** Baseline:  Goal status: {GOALSTATUS:25110}  4.  *** Baseline:  Goal status: {GOALSTATUS:25110}  5.  *** Baseline:  Goal status: {GOALSTATUS:25110}  6.  *** Baseline:  Goal status: {GOALSTATUS:25110}  LONG TERM GOALS: Target date: ***  Patient will be independent in self management strategies to improve quality of life and functional outcomes.  Baseline:  Goal status: INITIAL  2.  *** Baseline:  Goal status: {GOALSTATUS:25110}  3.  *** Baseline:  Goal status: {GOALSTATUS:25110}  4.  *** Baseline:  Goal status: {GOALSTATUS:25110}  5.  *** Baseline:  Goal status: {GOALSTATUS:25110}  6.  *** Baseline:  Goal status: {GOALSTATUS:25110}  PLAN:  PT FREQUENCY: {rehab frequency:25116}  PT DURATION: {rehab duration:25117}  PLANNED INTERVENTIONS:  Therapeutic exercises, Therapeutic activity, Neuromuscular re-education, Balance training, Gait training, Patient/Family education, Joint manipulation, Joint mobilization, Stair training, Orthotic/Fit training, DME instructions, Aquatic Therapy, Dry Needling, Electrical stimulation, Spinal manipulation, Spinal mobilization, Cryotherapy, Moist heat, Compression bandaging, scar mobilization, Splintting, Taping, Traction, Ultrasound, Ionotophoresis 4mg /ml Dexamethasone, and Manual therapy .  PLAN FOR NEXT SESSION: Review HEP and goals   1:38 PM, 12/27/21 Kashauna Celmer Small Katera Rybka MPT [place] [facility] [place] [phone]

## 2021-12-28 ENCOUNTER — Ambulatory Visit (HOSPITAL_COMMUNITY): Payer: PPO | Attending: Orthopedic Surgery | Admitting: Physical Therapy

## 2021-12-28 DIAGNOSIS — M25552 Pain in left hip: Secondary | ICD-10-CM | POA: Diagnosis not present

## 2021-12-28 DIAGNOSIS — M5416 Radiculopathy, lumbar region: Secondary | ICD-10-CM | POA: Diagnosis not present

## 2021-12-28 DIAGNOSIS — M6281 Muscle weakness (generalized): Secondary | ICD-10-CM | POA: Insufficient documentation

## 2021-12-28 DIAGNOSIS — M5410 Radiculopathy, site unspecified: Secondary | ICD-10-CM | POA: Diagnosis not present

## 2021-12-28 DIAGNOSIS — M545 Low back pain, unspecified: Secondary | ICD-10-CM | POA: Diagnosis not present

## 2021-12-28 DIAGNOSIS — M25551 Pain in right hip: Secondary | ICD-10-CM | POA: Insufficient documentation

## 2021-12-28 NOTE — Therapy (Signed)
OUTPATIENT PHYSICAL THERAPY THORACOLUMBAR EVALUATION   Patient Name: [name] MRN: [id number] DOB:07-Feb-1940, 81 y.o., female Today's Date: 12/28/2021  END OF SESSION:  PT End of Session - 12/28/21 1350     Visit Number 1    Number of Visits 8    Date for PT Re-Evaluation 01/25/22    Authorization Type [employer]    Progress Note Due on Visit 8    PT Start Time 1300    PT Stop Time 1340    PT Time Calculation (min) 40 min    Activity Tolerance Patient tolerated treatment well    Behavior During Therapy [facility] for tasks assessed/performed             Past Medical History:  Diagnosis Date   B12 deficiency    managed by Dr. Willey Blade: monthly injections   Hematuria 05/11/2015   Hemorrhoids    Hyperlipemia    Impaired fasting glucose    Neuropathy    hereditary and idiopathic   Osteoarthritis    Pelvic pressure in female 05/11/2015   PONV (postoperative nausea and vomiting)    Transient cerebral ischemic attack    no impairment   Vaginal atrophy 05/11/2015   Past Surgical History:  Procedure Laterality Date   ABDOMINAL HYSTERECTOMY     ANKLE FRACTURE SURGERY     left   APPENDECTOMY     CATARACT EXTRACTION     COLONOSCOPY  09/30/2003   RMR: Normal colon. Subtle abnormalities in the ileocecal valve and terminal  ileum as described above of doubtful clinical significance/Normal rectum   COLONOSCOPY N/A 11/03/2013   Procedure: COLONOSCOPY;  Surgeon: Daneil Dolin, MD;  [facility];  Service: Endoscopy;  Laterality: N/A;  830 - moved to 10:45 - Ginger to notify pt   REVERSE SHOULDER ARTHROPLASTY Right 07/02/2018   REVERSE SHOULDER ARTHROPLASTY Right 07/02/2018   Procedure: RIGHT REVERSE SHOULDER ARTHROPLASTY;  Surgeon: Meredith Pel, MD;  Location: [place];  Service: Orthopedics;  Laterality: Right;   right leg fracture surgery     right   TUBAL LIGATION     Patient Active Problem List   Diagnosis Date Noted   Shoulder arthritis 07/02/2018   Colles'  fracture of right radius 09/04/17 09/20/2017   Pelvic pressure in female 05/11/2015   Hematuria 05/11/2015   Vaginal atrophy 05/11/2015   Encounter for screening colonoscopy 10/08/2013   Ankle arthritis 05/19/2013   Left ankle strain 05/19/2013   Left ankle pain 05/19/2013   PRIMARY LOCALIZED OSTEOARTHROSIS LOWER LEG 04/15/2008   BUCKET HANDLE TEAR OF LATERAL MENISCUS 04/15/2008   TEAR LATERAL MENISCUS 04/15/2008   KNEE, ARTHRITIS, DEGEN./OSTEO 01/08/2008   DERANGEMENT MENISCUS 01/08/2008   LOOSE BODY-KNEE 01/08/2008   KNEE PAIN 01/08/2008    PCP: Dr. Asencion Noble  REFERRING PROVIDER: Carole Civil, [place]  REFERRING DIAG: [phone] (ICD-10-CM) - Bilateral hip pain M54.50 (ICD-10-CM) - Lumbar pain M54.10 (ICD-10-CM) - Radicular pain of both lower extremities  Rationale for Evaluation and Treatment: Rehabilitation  THERAPY DIAG:  Radiculopathy, lumbar region - Plan: PT plan of care cert/re-cert  Muscle weakness (generalized)  ONSET DATE: 11/23/21  SUBJECTIVE:  SUBJECTIVE STATEMENT: Pt states after walking or standing for 20 minutes her pain is so bad that she has to sit down. She can sit down and it ease off after about 20 minutes. The pain goes all the way down below her knees.   PERTINENT HISTORY:  OA, TIA   PAIN:  Are you having pain? Yes: NPRS scale: 8/10, goes to a 10,best is a 0 if she is not doing anything.  Pain location: back and B LE Right greater than Left  Pain description: constant throb Aggravating factors: Weight bearing  Relieving factors: sitting   PRECAUTIONS: None  WEIGHT BEARING RESTRICTIONS: No  FALLS:  Has patient fallen in last 6 months? No  LIVING ENVIRONMENT: Lives with: lives with their spouse Lives in:  House/apartment Stairs: Yes: External: 3 steps; on right going up;  ok with this but the [employer] steps gets to her.  Has following equipment at home: None  OCCUPATION: retired  PLOF: Independent with basic ADLs  PATIENT GOALS: less pain, to be able to move easier   NEXT MD VISIT:   OBJECTIVE:   DIAGNOSTIC FINDINGS:  Impression   1.  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  Abnormal coronal and sagittal alignment   3.  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  Facet arthritis L3-S1  PATIENT SURVEYS:  FOTO 40  SCREENING FOR RED FLAGS: Bowel or bladder incontinence: No Spinal tumors: No Cauda equina syndrome: No Compression fracture: No Abdominal aneurysm: No  COGNITION: Overall cognitive status: Within functional limits for tasks assessed       MUSCLE LENGTH: Hamstrings: Right 170 deg; Left 165 deg   POSTURE: decreased lumbar lordosis, increased thoracic kyphosis, and flexed trunk   PALPATION: RT paraspinal mm is tight.   LUMBAR ROM:   AROM eval  Flexion Able to touch toes reps no change    Extension 20 reps no changes   Right lateral flexion   Left lateral flexion   Right rotation   Left rotation    (Blank rows = not tested)  LOWER EXTREMITY MMT:    MMT Right eval Left eval  Hip flexion 3+ 4+  Hip extension 3- 3  Hip abduction 3 5  Hip adduction    Hip internal rotation    Hip external rotation    Knee flexion 3+ 4+  Knee extension 5 5  Ankle dorsiflexion 5 5  Ankle plantarflexion    Ankle inversion    Ankle eversion     (Blank rows = not tested)    FUNCTIONAL TESTS:  30 seconds chair stand test: 8 below average for healthy 81 yo  2 minute walk test : 368 ft no assistive device    TODAY'S TREATMENT:                                                                                                                              DATE: 12/28/21 physical therapy evaluation  and HEP instruction                               Ab set x `10                               Bridge x 10                              Knee to chest 3 x 20" B   PATIENT EDUCATION:  Education details: Patient educated on exam findings, POC, scope of PT, HEP, and posture. Person educated: Patient Education method: Explanation, Demonstration, and Handouts Education comprehension: verbalized understanding, returned demonstration, verbal cues required, and tactile cues required   HOME EXERCISE PROGRAM: Access Code: [email or web address] URL: [email or web address] Date: 12/28/2021 Prepared by: Rayetta Humphrey  Exercises - Supine Transversus Abdominis Bracing - Hands on Stomach  - 5 x daily - 7 x weekly - 1 sets - 10 reps - 5" hold - Supine Single Knee to Chest Stretch  - 2 x daily - 7 x weekly - 1 sets - 3 reps - 20" hold - Beginner Bridge  - 2 x daily - 7 x weekly - 1 sets - 10 reps - 5" hold  ASSESSMENT:  CLINICAL IMPRESSION: Patient is a 81 y.o. female  who was seen today for physical therapy evaluation and treatment for low back, hip and leg pain.  Evaluation demonstrates decreased hip strength, postural dysfunction, decreased activity tolerance and pain.  Ms. Kovalsky will benefit from skilled PT to maximize her functional ability.   OBJECTIVE IMPAIRMENTS: decreased activity tolerance, difficulty walking, decreased strength, postural dysfunction, and pain.   ACTIVITY LIMITATIONS: carrying, lifting, standing, and locomotion level  PARTICIPATION LIMITATIONS: cleaning, shopping, community activity, and church  PERSONAL FACTORS: 1 comorbidity: OA  are also affecting patient's functional outcome.   REHAB POTENTIAL: Good  CLINICAL DECISION MAKING: Stable/uncomplicated  EVALUATION COMPLEXITY: Low   GOALS: Goals reviewed with patient? No  SHORT TERM GOALS: Target date: 01/11/22  Patient will be independent with initial HEP Baseline: Goal status: INITIAL  2.  PT pain level to be no greater than a 7/10 Baseline:  Goal status: INITIAL  3.   PT LE strength to be increased 1/2 grade  to be able to stand/walk for 30 minutes for shopping activity without increased pain.  Baseline:  Goal status: INITIAL  LONG TERM GOALS: Target date: 01/25/22  Patient will be independent in self management strategies to improve quality of life and functional outcomes.  Baseline:  Goal status: INITIAL  2.  PT pain level to be no greater than a 5/10 Baseline:  Goal status: INITIAL  3.  PT LE strength to be increased 1 grade  to be able to stand/walk for 60 minutes for shopping activity without increased pain Baseline:  Goal status: INITIAL    PLAN:  PT FREQUENCY: 2x/week  PT DURATION: 4 weeks  PLANNED INTERVENTIONS:  Therapeutic exercises, Therapeutic activity, Neuromuscular re-education, Balance training, Gait training, Patient/Family education, Joint manipulation, Joint mobilization, Stair training, Orthotic/Fit training, DME instructions, Aquatic Therapy, Dry Needling, Electrical stimulation, Spinal manipulation, Spinal mobilization, Cryotherapy, Moist heat, Compression bandaging, scar mobilization, Splintting, Taping, Traction, Ultrasound, Ionotophoresis 4mg /ml Dexamethasone, and Manual therapy .  PLAN FOR NEXT SESSION: Review HEP and goals.  Continue with pelvic tilt, lumbar stabilization exercises to include bent knee lift,  clam, dead bug. Progress to sidelying, prone and standing activity.Virgina Organ, PT CLT [phone]  [phone]

## 2022-01-10 ENCOUNTER — Encounter (HOSPITAL_COMMUNITY): Payer: PPO

## 2022-01-12 ENCOUNTER — Encounter (HOSPITAL_COMMUNITY): Payer: PPO | Admitting: Physical Therapy

## 2022-01-12 ENCOUNTER — Ambulatory Visit (HOSPITAL_COMMUNITY): Payer: PPO | Admitting: Physical Therapy

## 2022-01-12 ENCOUNTER — Encounter (HOSPITAL_COMMUNITY): Payer: Self-pay | Admitting: Physical Therapy

## 2022-01-12 DIAGNOSIS — M5416 Radiculopathy, lumbar region: Secondary | ICD-10-CM

## 2022-01-12 DIAGNOSIS — M6281 Muscle weakness (generalized): Secondary | ICD-10-CM

## 2022-01-12 NOTE — Therapy (Signed)
OUTPATIENT PHYSICAL THERAPY THORACOLUMBAR EVALUATION   Patient Name: [name] MRN: [id number] DOB:12/28/1940, 81 y.o., female Today's Date: 01/12/2022  END OF SESSION:  PT End of Session - 01/12/22 1354     Visit Number 2    Number of Visits 8    Date for PT Re-Evaluation 01/25/22    Authorization Type [employer]    Progress Note Due on Visit 8    PT Start Time 1351    PT Stop Time 1429    PT Time Calculation (min) 38 min    Activity Tolerance Patient tolerated treatment well    Behavior During Therapy [facility] for tasks assessed/performed             Past Medical History:  Diagnosis Date   B12 deficiency    managed by Dr. Willey Blade: monthly injections   Hematuria 05/11/2015   Hemorrhoids    Hyperlipemia    Impaired fasting glucose    Neuropathy    hereditary and idiopathic   Osteoarthritis    Pelvic pressure in female 05/11/2015   PONV (postoperative nausea and vomiting)    Transient cerebral ischemic attack    no impairment   Vaginal atrophy 05/11/2015   Past Surgical History:  Procedure Laterality Date   ABDOMINAL HYSTERECTOMY     ANKLE FRACTURE SURGERY     left   APPENDECTOMY     CATARACT EXTRACTION     COLONOSCOPY  09/30/2003   RMR: Normal colon. Subtle abnormalities in the ileocecal valve and terminal  ileum as described above of doubtful clinical significance/Normal rectum   COLONOSCOPY N/A 11/03/2013   Procedure: COLONOSCOPY;  Surgeon: Daneil Dolin, MD;  [facility];  Service: Endoscopy;  Laterality: N/A;  830 - moved to 10:45 - Ginger to notify pt   REVERSE SHOULDER ARTHROPLASTY Right 07/02/2018   REVERSE SHOULDER ARTHROPLASTY Right 07/02/2018   Procedure: RIGHT REVERSE SHOULDER ARTHROPLASTY;  Surgeon: Meredith Pel, MD;  Location: [place];  Service: Orthopedics;  Laterality: Right;   right leg fracture surgery     right   TUBAL LIGATION     Patient Active Problem List   Diagnosis Date Noted   Shoulder arthritis 07/02/2018    Colles' fracture of right radius 09/04/17 09/20/2017   Pelvic pressure in female 05/11/2015   Hematuria 05/11/2015   Vaginal atrophy 05/11/2015   Encounter for screening colonoscopy 10/08/2013   Ankle arthritis 05/19/2013   Left ankle strain 05/19/2013   Left ankle pain 05/19/2013   PRIMARY LOCALIZED OSTEOARTHROSIS LOWER LEG 04/15/2008   BUCKET HANDLE TEAR OF LATERAL MENISCUS 04/15/2008   TEAR LATERAL MENISCUS 04/15/2008   KNEE, ARTHRITIS, DEGEN./OSTEO 01/08/2008   DERANGEMENT MENISCUS 01/08/2008   LOOSE BODY-KNEE 01/08/2008   KNEE PAIN 01/08/2008    PCP: Dr. Asencion Noble  REFERRING PROVIDER: Carole Civil, [place]  REFERRING DIAG: [phone] (ICD-10-CM) - Bilateral hip pain M54.50 (ICD-10-CM) - Lumbar pain M54.10 (ICD-10-CM) - Radicular pain of both lower extremities  Rationale for Evaluation and Treatment: Rehabilitation  THERAPY DIAG:  Radiculopathy, lumbar region  Muscle weakness (generalized)  ONSET DATE: 11/23/21  SUBJECTIVE:  SUBJECTIVE STATEMENT: Patient states she has been trying to do the exercises but they hurt. She does not feel they are helping. She is also taking medication which she does not feel has been helpful.   PERTINENT HISTORY:  OA, TIA   PAIN:  Are you having pain? Yes: NPRS scale: 8/10 Pain location: back and RT lateral hip  Pain description: constant throb Aggravating factors: Weight bearing  Relieving factors: sitting   PRECAUTIONS: None  WEIGHT BEARING RESTRICTIONS: No  FALLS:  Has patient fallen in last 6 months? No  LIVING ENVIRONMENT: Lives with: lives with their spouse Lives in: House/apartment Stairs: Yes: External: 3 steps; on right going up;  ok with this but the Church steps gets to her.  Has following  equipment at home: None  OCCUPATION: retired  PLOF: Independent with basic ADLs  PATIENT GOALS: less pain, to be able to move easier   NEXT MD VISIT:   OBJECTIVE:   DIAGNOSTIC FINDINGS:  Impression   1.  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  Abnormal coronal and sagittal alignment   3.  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  Facet arthritis L3-S1  PATIENT SURVEYS:  FOTO 40  SCREENING FOR RED FLAGS: Bowel or bladder incontinence: No Spinal tumors: No Cauda equina syndrome: No Compression fracture: No Abdominal aneurysm: No  COGNITION: Overall cognitive status: Within functional limits for tasks assessed       MUSCLE LENGTH: Hamstrings: Right 170 deg; Left 165 deg   POSTURE: decreased lumbar lordosis, increased thoracic kyphosis, and flexed trunk   PALPATION: RT paraspinal mm is tight.   LUMBAR ROM:   AROM eval  Flexion Able to touch toes reps no change    Extension 20 reps no changes   Right lateral flexion   Left lateral flexion   Right rotation   Left rotation    (Blank rows = not tested)  LOWER EXTREMITY MMT:    MMT Right eval Left eval  Hip flexion 3+ 4+  Hip extension 3- 3  Hip abduction 3 5  Hip adduction    Hip internal rotation    Hip external rotation    Knee flexion 3+ 4+  Knee extension 5 5  Ankle dorsiflexion 5 5  Ankle plantarflexion    Ankle inversion    Ankle eversion     (Blank rows = not tested)    FUNCTIONAL TESTS:  30 seconds chair stand test: 8 below average for healthy 81 yo  2 minute walk test : 368 ft no assistive device    TODAY'S TREATMENT:                                                                                                                              DATE:  01/12/22 Heel slide x15 Bridge 15 x 5" Hip abduction iso 15 x 5" Hip adduction iso 15 x 5" LTR 10 x 5" Ab brace 15 x 5"  Ab march x20  12/28/21 physical therapy evaluation and HEP instruction                               Ab  set x `10                              Bridge x 10                              Knee to chest 3 x 20" B   PATIENT EDUCATION:  Education details: Patient educated on exam findings, POC, scope of PT, HEP, and posture. Person educated: Patient Education method: Explanation, Demonstration, and Handouts Education comprehension: verbalized understanding, returned demonstration, verbal cues required, and tactile cues required   HOME EXERCISE PROGRAM: Access Code: [email or web address] URL: [email or web address]  01/12/22 Access Code: [email or web address] URL: [email or web address] Date: 01/12/2022 Prepared by: Josue Hector  Exercises - Supine Transversus Abdominis Bracing - Hands on Stomach  - 5 x daily - 7 x weekly - 1 sets - 10 reps - 5" hold - Beginner Bridge  - 3 x daily - 7 x weekly - 1 sets - 10 reps - 5" hold - Supine Hip Adduction Isometric with Ball  - 3 x daily - 7 x weekly - 1 sets - 10 reps - 5" hold - Hooklying Isometric Hip Abduction with Belt  - 3 x daily - 7 x weekly - 1 sets - 10 reps - 5" hold  Date: 12/28/2021 Prepared by: Rayetta Humphrey  Exercises - Supine Transversus Abdominis Bracing - Hands on Stomach  - 5 x daily - 7 x weekly - 1 sets - 10 reps - 5" hold - Supine Single Knee to Chest Stretch  - 2 x daily - 7 x weekly - 1 sets - 3 reps - 20" hold - Beginner Bridge  - 2 x daily - 7 x weekly - 1 sets - 10 reps - 5" hold  ASSESSMENT:  CLINICAL IMPRESSION: Patient tolerated session well today. Modified HEP for improved comfort. DC knee to chest stretch as this increased pain. Focused on hip and core isometric for pain free lumbar stabilization. Patient educated on purpose and function of all added exercises. Added to HEP and issued updated handout. Patient will continue to benefit from skilled therapy services to reduce remaining deficits and improve functional ability.    OBJECTIVE IMPAIRMENTS: decreased activity tolerance, difficulty walking, decreased  strength, postural dysfunction, and pain.   ACTIVITY LIMITATIONS: carrying, lifting, standing, and locomotion level  PARTICIPATION LIMITATIONS: cleaning, shopping, community activity, and church  PERSONAL FACTORS: 1 comorbidity: OA  are also affecting patient's functional outcome.   REHAB POTENTIAL: Good  CLINICAL DECISION MAKING: Stable/uncomplicated  EVALUATION COMPLEXITY: Low   GOALS: Goals reviewed with patient? No  SHORT TERM GOALS: Target date: 01/11/22  Patient will be independent with initial HEP Baseline: Goal status: INITIAL  2.  PT pain level to be no greater than a 7/10 Baseline:  Goal status: INITIAL  3.  PT LE strength to be increased 1/2 grade  to be able to stand/walk for 30 minutes for shopping activity without increased pain.  Baseline:  Goal status: INITIAL  LONG TERM GOALS: Target date: 01/25/22  Patient will be independent in self management strategies to improve quality of life and functional outcomes.  Baseline:  Goal status: INITIAL  2.  PT pain level to be no greater than a 5/10 Baseline:  Goal status: INITIAL  3.  PT LE strength to be increased 1 grade  to be able to stand/walk for 60 minutes for shopping activity without increased pain Baseline:  Goal status: INITIAL    PLAN:  PT FREQUENCY: 2x/week  PT DURATION: 4 weeks  PLANNED INTERVENTIONS:  Therapeutic exercises, Therapeutic activity, Neuromuscular re-education, Balance training, Gait training, Patient/Family education, Joint manipulation, Joint mobilization, Stair training, Orthotic/Fit training, DME instructions, Aquatic Therapy, Dry Needling, Electrical stimulation, Spinal manipulation, Spinal mobilization, Cryotherapy, Moist heat, Compression bandaging, scar mobilization, Splintting, Taping, Traction, Ultrasound, Ionotophoresis 4mg /ml Dexamethasone, and Manual therapy .  PLAN FOR NEXT SESSION: Continue with pelvic tilt, lumbar stabilization exercises to include bent knee  lift, clam, dead bug. Progress to sidelying, prone and standing activity.  1:55 PM, 01/12/22 01/14/22 PT DPT  Physical Therapist with [facility]  [phone]

## 2022-01-24 DIAGNOSIS — D519 Vitamin B12 deficiency anemia, unspecified: Secondary | ICD-10-CM | POA: Diagnosis not present

## 2022-01-26 ENCOUNTER — Ambulatory Visit: Payer: Medicare HMO | Admitting: Orthopedic Surgery

## 2022-01-26 ENCOUNTER — Encounter: Payer: Self-pay | Admitting: Orthopedic Surgery

## 2022-01-26 ENCOUNTER — Ambulatory Visit (HOSPITAL_COMMUNITY): Payer: Medicare HMO | Attending: Orthopedic Surgery | Admitting: Physical Therapy

## 2022-01-26 VITALS — Ht 60.0 in | Wt 155.0 lb

## 2022-01-26 DIAGNOSIS — M5416 Radiculopathy, lumbar region: Secondary | ICD-10-CM | POA: Diagnosis not present

## 2022-01-26 DIAGNOSIS — M545 Low back pain, unspecified: Secondary | ICD-10-CM | POA: Diagnosis not present

## 2022-01-26 DIAGNOSIS — M25552 Pain in left hip: Secondary | ICD-10-CM

## 2022-01-26 DIAGNOSIS — M48062 Spinal stenosis, lumbar region with neurogenic claudication: Secondary | ICD-10-CM

## 2022-01-26 DIAGNOSIS — M1610 Unilateral primary osteoarthritis, unspecified hip: Secondary | ICD-10-CM | POA: Diagnosis not present

## 2022-01-26 DIAGNOSIS — M5410 Radiculopathy, site unspecified: Secondary | ICD-10-CM

## 2022-01-26 DIAGNOSIS — M25551 Pain in right hip: Secondary | ICD-10-CM

## 2022-01-26 DIAGNOSIS — M6281 Muscle weakness (generalized): Secondary | ICD-10-CM | POA: Diagnosis not present

## 2022-01-26 NOTE — Progress Notes (Addendum)
FOLLOW UP   Encounter Diagnoses  Name Primary?   Bilateral hip pain Yes   Lumbar pain    Radicular pain of both lower extremities    Hip arthritis    Spinal stenosis of lumbar region with neurogenic claudication     Chief Complaint  Patient presents with   Back Pain    Right hip still painful, both legs still painful, states the legs are painful still, also has gone for therapy not much better     PRIOR TREATMENT: IB 800 mg LYRICA 50 MG BID  /  PT 2 visits  PLUS HOME EXERCISES  SHE has not improved   I m rec an MRI, spine surgery referral for possible ESI   I can not do any thing else medically      Breath sounds: No wheezing.    Musculoskeletal:     Lumbar back: Negative right straight leg raise test and negative left straight leg raise test.     Comments: Her back pain is very minimal.  She has no pain with range of motion of the left hip mild groin pain with range of motion of the right hip  Straight leg raises are normal

## 2022-01-26 NOTE — Therapy (Signed)
OUTPATIENT PHYSICAL THERAPY THORACOLUMBAR EVALUATION   Patient Name: TANAE PETROSKY MRN: [id number] DOB:August 07, 1940, 82 y.o., female Today's Date: 01/26/2022  END OF SESSION:  PT End of Session - 01/26/22 1300     Visit Number 3    Number of Visits 8    Date for PT Re-Evaluation 02/21/22    Authorization Type [employer]    Progress Note Due on Visit 8    PT Start Time 1300    PT Stop Time 1345    PT Time Calculation (min) 45 min    Activity Tolerance Patient tolerated treatment well    Behavior During Therapy [facility] for tasks assessed/performed             Past Medical History:  Diagnosis Date   B12 deficiency    managed by Dr. Willey Blade: monthly injections   Hematuria 05/11/2015   Hemorrhoids    Hyperlipemia    Impaired fasting glucose    Neuropathy    hereditary and idiopathic   Osteoarthritis    Pelvic pressure in female 05/11/2015   PONV (postoperative nausea and vomiting)    Transient cerebral ischemic attack    no impairment   Vaginal atrophy 05/11/2015   Past Surgical History:  Procedure Laterality Date   ABDOMINAL HYSTERECTOMY     ANKLE FRACTURE SURGERY     left   APPENDECTOMY     CATARACT EXTRACTION     COLONOSCOPY  09/30/2003   RMR: Normal colon. Subtle abnormalities in the ileocecal valve and terminal  ileum as described above of doubtful clinical significance/Normal rectum   COLONOSCOPY N/A 11/03/2013   Procedure: COLONOSCOPY;  Surgeon: Daneil Dolin, MD;  [facility];  Service: Endoscopy;  Laterality: N/A;  830 - moved to 10:45 - Ginger to notify pt   REVERSE SHOULDER ARTHROPLASTY Right 07/02/2018   REVERSE SHOULDER ARTHROPLASTY Right 07/02/2018   Procedure: RIGHT REVERSE SHOULDER ARTHROPLASTY;  Surgeon: Meredith Pel, MD;  Location: [place];  Service: Orthopedics;  Laterality: Right;   right leg fracture surgery     right   TUBAL LIGATION     Patient Active Problem List   Diagnosis Date Noted   Shoulder arthritis 07/02/2018   Colles'  fracture of right radius 09/04/17 09/20/2017   Pelvic pressure in female 05/11/2015   Hematuria 05/11/2015   Vaginal atrophy 05/11/2015   Encounter for screening colonoscopy 10/08/2013   Ankle arthritis 05/19/2013   Left ankle strain 05/19/2013   Left ankle pain 05/19/2013   PRIMARY LOCALIZED OSTEOARTHROSIS LOWER LEG 04/15/2008   BUCKET HANDLE TEAR OF LATERAL MENISCUS 04/15/2008   TEAR LATERAL MENISCUS 04/15/2008   KNEE, ARTHRITIS, DEGEN./OSTEO 01/08/2008   DERANGEMENT MENISCUS 01/08/2008   LOOSE BODY-KNEE 01/08/2008   KNEE PAIN 01/08/2008    PCP: Dr. Asencion Noble  REFERRING PROVIDER: Carole Civil, [place]  REFERRING DIAG: [phone] (ICD-10-CM) - Bilateral hip pain M54.50 (ICD-10-CM) - Lumbar pain M54.10 (ICD-10-CM) - Radicular pain of both lower extremities  Rationale for Evaluation and Treatment: Rehabilitation  THERAPY DIAG:  Radiculopathy, lumbar region  Muscle weakness (generalized)  ONSET DATE: 11/23/21  SUBJECTIVE:  SUBJECTIVE STATEMENT: Pt states that she is doing her exercises but they do not seem to help. Pt states her MD wants her to get the epidural. PERTINENT HISTORY:  OA, TIA   PAIN:  Are you having pain? Yes: NPRS scale: 8/10 Pain location: back and RT lateral hip  Pain description: constant throb Aggravating factors: Weight bearing  Relieving factors: sitting   PRECAUTIONS: None  WEIGHT BEARING RESTRICTIONS: No  PATIENT GOALS: less pain, to be able to move easier   NEXT MD VISIT:   OBJECTIVE:   DIAGNOSTIC FINDINGS:  Impression   1.  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  Abnormal coronal and sagittal alignment   3.  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  Facet arthritis L3-S1  PATIENT SURVEYS:  FOTO  40  MUSCLE LENGTH: Hamstrings: Right 170 deg; Left 165 deg   POSTURE: decreased lumbar lordosis, increased thoracic kyphosis, and flexed trunk   PALPATION: RT paraspinal mm is tight.   LUMBAR ROM:   AROM eval  Flexion Able to touch toes reps no change    Extension 20 reps no changes   Right lateral flexion   Left lateral flexion   Right rotation   Left rotation    (Blank rows = not tested)  LOWER EXTREMITY MMT:    MMT Right eval Left eval  Hip flexion 3+ 4+  Hip extension 3- 3  Hip abduction 3 5  Hip adduction    Hip internal rotation    Hip external rotation    Knee flexion 3+ 4+  Knee extension 5 5  Ankle dorsiflexion 5 5  Ankle plantarflexion    Ankle inversion    Ankle eversion     (Blank rows = not tested)    FUNCTIONAL TESTS:  30 seconds chair stand test: 8 below average for healthy 82 yo  2 minute walk test : 368 ft no assistive device    TODAY'S TREATMENT:                                                                                                                              DATE:  01/26/22 Supine: Decompression exercises 1-5 Theraband decompression exercises red : Pull down, overhead, ER and sash all x 5 Abdominal set x 10  Bridge x 10 Active hamstring stretch x 10   Clam x10 B Bridge x 10  Roll to RT x5 Roll to LT x 5 Sidelying to si  01/12/22 Heel slide x15 Bridge 15 x 5" Hip abduction iso 15 x 5" Hip adduction iso 15 x 5" LTR 10 x 5" Ab brace 15 x 5" Ab march x20  12/28/21 physical therapy evaluation and HEP instruction                               Ab set x `10  Bridge x 10                              Knee to chest 3 x 20" B   PATIENT EDUCATION:  Education details: Patient educated on exam findings, POC, scope of PT, HEP, and posture. Person educated: Patient Education method: Explanation, Demonstration, and Handouts Education comprehension: verbalized understanding, returned demonstration,  verbal cues required, and tactile cues required   HOME EXERCISE PROGRAM: Access Code: [email or web address] URL: [email or web address] 01/26/22- Bent Knee Fallouts  - 1 x daily - 7 x weekly - 1 sets - 10 reps - Supine Hamstring Stretch  - 1 x daily - 7 x weekly - 1 sets - 3 reps - 20-30" hold Decompression ex T band decompression ex  t  01/12/22 Access Code: [email or web address] URL: [email or web address] Date: 01/12/2022 Prepared by: Georges Lynch  Exercises - Supine Transversus Abdominis Bracing - Hands on Stomach  - 5 x daily - 7 x weekly - 1 sets - 10 reps - 5" hold - Beginner Bridge  - 3 x daily - 7 x weekly - 1 sets - 10 reps - 5" hold - Supine Hip Adduction Isometric with Ball  - 3 x daily - 7 x weekly - 1 sets - 10 reps - 5" hold - Hooklying Isometric Hip Abduction with Belt  - 3 x daily - 7 x weekly - 1 sets - 10 reps - 5" hold  Date: 12/28/2021 Prepared by: Virgina Organ  Exercises - Supine Transversus Abdominis Bracing - Hands on Stomach  - 5 x daily - 7 x weekly - 1 sets - 10 reps - 5" hold - Supine Single Knee to Chest Stretch  - 2 x daily - 7 x weekly - 1 sets - 3 reps - 20" hold - Beginner Bridge  - 2 x daily - 7 x weekly - 1 sets - 10 reps - 5" hold  ASSESSMENT:  CLINICAL IMPRESSION: Added body mechanics for bed mobility as well as decompression exercises. . Added to HEP and issued updated handout. Patient will continue to benefit from skilled therapy services to reduce remaining deficits and improve functional ability.    OBJECTIVE IMPAIRMENTS: decreased activity tolerance, difficulty walking, decreased strength, postural dysfunction, and pain.   ACTIVITY LIMITATIONS: carrying, lifting, standing, and locomotion level  PARTICIPATION LIMITATIONS: cleaning, shopping, community activity, and church  PERSONAL FACTORS: 1 comorbidity: OA  are also affecting patient's functional outcome.   REHAB POTENTIAL: Good  CLINICAL DECISION MAKING:  Stable/uncomplicated  EVALUATION COMPLEXITY: Low   GOALS: Goals reviewed with patient?yes   SHORT TERM GOALS: Target date: 01/11/22  Patient will be independent with initial HEP Baseline: Goal status: IN PROGRESS  2.  PT pain level to be no greater than a 7/10 Baseline:  Goal status: IN PROGRESS  3.  PT LE strength to be increased 1/2 grade  to be able to stand/walk for 30 minutes for shopping activity without increased pain.  Baseline:  Goal status: IN PROGRESS  LONG TERM GOALS: Target date: 01/25/22  Patient will be independent in self management strategies to improve quality of life and functional outcomes.  Baseline:  Goal status: IN PROGRESS  2.  PT pain level to be no greater than a 5/10 Baseline:  Goal status: IN PROGRESS  3.  PT LE strength to be increased 1 grade  to be able to stand/walk for 60 minutes for shopping activity without increased pain Baseline:  Goal status: IN  PROGRESS    PLAN:  PT FREQUENCY: 2x/week  PT DURATION: 4 weeks  PLANNED INTERVENTIONS:  Therapeutic exercises, Therapeutic activity, Neuromuscular re-education, Balance training, Gait training, Patient/Family education, Joint manipulation, Joint mobilization, Stair training, Orthotic/Fit training, DME instructions, Aquatic Therapy, Dry Needling, Electrical stimulation, Spinal manipulation, Spinal mobilization, Cryotherapy, Moist heat, Compression bandaging, scar mobilization, Splintting, Taping, Traction, Ultrasound, Ionotophoresis 4mg /ml Dexamethasone, and Manual therapy .  PLAN FOR NEXT SESSION: Continue with pelvic tilt, lumbar stabilization exercises to include bent knee lift, clam, dead bug. Progress to sidelying, prone and standing activity. Rayetta Humphrey, [place]  [phone]

## 2022-01-26 NOTE — Patient Instructions (Signed)
While we are working on your approval for MRI please go ahead and call to schedule your appointment with [facility] within at least one (1) week.   Central Scheduling [phone]  

## 2022-01-31 ENCOUNTER — Ambulatory Visit (HOSPITAL_COMMUNITY): Payer: Medicare HMO | Admitting: Physical Therapy

## 2022-01-31 DIAGNOSIS — M5416 Radiculopathy, lumbar region: Secondary | ICD-10-CM | POA: Diagnosis not present

## 2022-01-31 DIAGNOSIS — M6281 Muscle weakness (generalized): Secondary | ICD-10-CM

## 2022-01-31 NOTE — Therapy (Signed)
OUTPATIENT PHYSICAL THERAPY THORACOLUMBAR EVALUATION   Patient Name: [name] MRN: [id number] DOB:04-Jan-1941, 82 y.o., female Today's Date: 01/31/2022  END OF SESSION:  PT End of Session - 01/31/22 1156    Visit Number 4    Number of Visits 8    Date for PT Re-Evaluation 02/21/22    Authorization Type [employer]    Progress Note Due on Visit 8    PT Start Time 1115    PT Stop Time 1156   PT Time Calculation (min) 41 min    Activity Tolerance Patient tolerated treatment well    Behavior During Therapy [facility] for tasks assessed/performed             Past Medical History:  Diagnosis Date   B12 deficiency    managed by Dr. Ouida Sills: monthly injections   Hematuria 05/11/2015   Hemorrhoids    Hyperlipemia    Impaired fasting glucose    Neuropathy    hereditary and idiopathic   Osteoarthritis    Pelvic pressure in female 05/11/2015   PONV (postoperative nausea and vomiting)    Transient cerebral ischemic attack    no impairment   Vaginal atrophy 05/11/2015   Past Surgical History:  Procedure Laterality Date   ABDOMINAL HYSTERECTOMY     ANKLE FRACTURE SURGERY     left   APPENDECTOMY     CATARACT EXTRACTION     COLONOSCOPY  09/30/2003   RMR: Normal colon. Subtle abnormalities in the ileocecal valve and terminal  ileum as described above of doubtful clinical significance/Normal rectum   COLONOSCOPY N/A 11/03/2013   Procedure: COLONOSCOPY;  Surgeon: Corbin Ade, MD;  [facility];  Service: Endoscopy;  Laterality: N/A;  830 - moved to 10:45 - Ginger to notify pt   REVERSE SHOULDER ARTHROPLASTY Right 07/02/2018   REVERSE SHOULDER ARTHROPLASTY Right 07/02/2018   Procedure: RIGHT REVERSE SHOULDER ARTHROPLASTY;  Surgeon: Cammy Copa, MD;  Location: [facility] OR;  Service: Orthopedics;  Laterality: Right;   right leg fracture surgery     right   TUBAL LIGATION     Patient Active Problem List   Diagnosis Date Noted   Shoulder arthritis 07/02/2018   Colles'  fracture of right radius 09/04/17 09/20/2017   Pelvic pressure in female 05/11/2015   Hematuria 05/11/2015   Vaginal atrophy 05/11/2015   Encounter for screening colonoscopy 10/08/2013   Ankle arthritis 05/19/2013   Left ankle strain 05/19/2013   Left ankle pain 05/19/2013   PRIMARY LOCALIZED OSTEOARTHROSIS LOWER LEG 04/15/2008   BUCKET HANDLE TEAR OF LATERAL MENISCUS 04/15/2008   TEAR LATERAL MENISCUS 04/15/2008   KNEE, ARTHRITIS, DEGEN./OSTEO 01/08/2008   DERANGEMENT MENISCUS 01/08/2008   LOOSE BODY-KNEE 01/08/2008   KNEE PAIN 01/08/2008    PCP: Dr. Carylon Perches  REFERRING PROVIDER: Vickki Hearing, [facility]  REFERRING DIAG: [phone] (ICD-10-CM) - Bilateral hip pain M54.50 (ICD-10-CM) - Lumbar pain M54.10 (ICD-10-CM) - Radicular pain of both lower extremities  Rationale for Evaluation and Treatment: Rehabilitation  THERAPY DIAG:  Radiculopathy, lumbar region  Muscle weakness (generalized)  ONSET DATE: 11/23/21  SUBJECTIVE:  SUBJECTIVE STATEMENT: PT states she had a household of people and her back is doing better.   Pt states she is scheduled for a MRI on the 25th   PERTINENT HISTORY:  OA, TIA   PAIN:  Are you having pain? Yes: NPRS scale: 6/10 Pain location: back and RT lateral hip  Pain description: constant throb Aggravating factors: Weight bearing  Relieving factors: sitting   PRECAUTIONS: None  WEIGHT BEARING RESTRICTIONS: No  PATIENT GOALS: less pain, to be able to move easier    OBJECTIVE:   DIAGNOSTIC FINDINGS:  Impression   1.  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  Abnormal coronal and sagittal alignment   3.  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  Facet arthritis L3-S1  PATIENT SURVEYS:  FOTO 40  MUSCLE  LENGTH: Hamstrings: Right 170 deg; Left 165 deg   POSTURE: decreased lumbar lordosis, increased thoracic kyphosis, and flexed trunk   PALPATION: RT paraspinal mm is tight.   LUMBAR ROM:   AROM eval  Flexion Able to touch toes reps no change    Extension 20 reps no changes   Right lateral flexion   Left lateral flexion   Right rotation   Left rotation    (Blank rows = not tested)  LOWER EXTREMITY MMT:    MMT Right eval Left eval  Hip flexion 3+ 4+  Hip extension 3- 3  Hip abduction 3 5  Hip adduction    Hip internal rotation    Hip external rotation    Knee flexion 3+ 4+  Knee extension 5 5  Ankle dorsiflexion 5 5  Ankle plantarflexion    Ankle inversion    Ankle eversion     (Blank rows = not tested)    FUNCTIONAL TESTS:  30 seconds chair stand test: 8 below average for healthy 82 yo  2 minute walk test : 368 ft no assistive device    TODAY'S TREATMENT:                                                                                                                              DATE:  01/31/2022: Standing: Wall arch x 10 Stabilize back while completing B UE flexion x 10 Supine: Bridge x 15 for 5"  Dead bug x 10  Piriformis stretch 3 x 30" Bilateral Knee to chest   x 3 LTR x 5 Sidelying: Hip abduction: B  Clam: B  Sitting: thoracic excursion x 3 01/26/22 Supine: Decompression exercises 1-5 Theraband decompression exercises red : Pull down, overhead, ER and sash all x 5 Abdominal set x 10  Bridge x 10 Active hamstring stretch x 10   Clam x10 B Bridge x 10  Roll to RT x5 Roll to LT x 5 Sidelying to si  01/12/22 Heel slide x15 Bridge 15 x 5" Hip abduction iso 15 x 5" Hip adduction iso 15 x 5" LTR 10 x 5" Ab brace 15  x 5" Ab march x20  12/28/21 physical therapy evaluation and HEP instruction                               Ab set x `10                              Bridge x 10                              Knee to chest 3 x 20" B   PATIENT  EDUCATION:  Education details: Patient educated on exam findings, POC, scope of PT, HEP, and posture. Person educated: Patient Education method: Explanation, Demonstration, and Handouts Education comprehension: verbalized understanding, returned demonstration, verbal cues required, and tactile cues required   HOME EXERCISE PROGRAM: Access Code: [email or web address] 01/31/2022 Sidelying: Hip abduction x 10  Clam x 10  URL: [email or web address] 01/26/22- Bent Knee Fallouts  - 1 x daily - 7 x weekly - 1 sets - 10 reps - Supine Hamstring Stretch  - 1 x daily - 7 x weekly - 1 sets - 3 reps - 20-30" hold Decompression ex T band decompression ex  t  01/12/22 Access Code: [email or web address] URL: [email or web address] Date: 01/12/2022 Prepared by: Georges Lynch  Exercises - Supine Transversus Abdominis Bracing - Hands on Stomach  - 5 x daily - 7 x weekly - 1 sets - 10 reps - 5" hold - Beginner Bridge  - 3 x daily - 7 x weekly - 1 sets - 10 reps - 5" hold - Supine Hip Adduction Isometric with Ball  - 3 x daily - 7 x weekly - 1 sets - 10 reps - 5" hold - Hooklying Isometric Hip Abduction with Belt  - 3 x daily - 7 x weekly - 1 sets - 10 reps - 5" hold  Date: 12/28/2021 Prepared by: Virgina Organ  Exercises - Supine Transversus Abdominis Bracing - Hands on Stomach  - 5 x daily - 7 x weekly - 1 sets - 10 reps - 5" hold - Supine Single Knee to Chest Stretch  - 2 x daily - 7 x weekly - 1 sets - 3 reps - 20" hold - Beginner Bridge  - 2 x daily - 7 x weekly - 1 sets - 10 reps - 5" hold  ASSESSMENT:  CLINICAL IMPRESSION: Pt treatment continued with stabilization strengthening as well as stretching of the low back with pt overall feeling better. Pt treatment advanced in stretching as well as side lying stabilization.  Patient will continue to benefit from skilled therapy services to reduce remaining deficits and improve functional ability.    OBJECTIVE IMPAIRMENTS: decreased activity  tolerance, difficulty walking, decreased strength, postural dysfunction, and pain.   ACTIVITY LIMITATIONS: carrying, lifting, standing, and locomotion level  PARTICIPATION LIMITATIONS: cleaning, shopping, community activity, and church  PERSONAL FACTORS: 1 comorbidity: OA  are also affecting patient's functional outcome.   REHAB POTENTIAL: Good  CLINICAL DECISION MAKING: Stable/uncomplicated  EVALUATION COMPLEXITY: Low   GOALS: Goals reviewed with patient?yes   SHORT TERM GOALS: Target date: 01/11/22  Patient will be independent with initial HEP Baseline: Goal status: IN PROGRESS  2.  PT pain level to be no greater than a 7/10 Baseline:  Goal status: IN PROGRESS  3.  PT LE strength to be increased 1/2 grade  to be able  to stand/walk for 30 minutes for shopping activity without increased pain.  Baseline:  Goal status: IN PROGRESS  LONG TERM GOALS: Target date: 01/25/22  Patient will be independent in self management strategies to improve quality of life and functional outcomes.  Baseline:  Goal status: IN PROGRESS  2.  PT pain level to be no greater than a 5/10 Baseline:  Goal status: IN PROGRESS  3.  PT LE strength to be increased 1 grade  to be able to stand/walk for 60 minutes for shopping activity without increased pain Baseline:  Goal status: IN PROGRESS    PLAN:  PT FREQUENCY: 2x/week  PT DURATION: 4 weeks  PLANNED INTERVENTIONS:  Therapeutic exercises, Therapeutic activity, Neuromuscular re-education, Balance training, Gait training, Patient/Family education, Joint manipulation, Joint mobilization, Stair training, Orthotic/Fit training, DME instructions, Aquatic Therapy, Dry Needling, Electrical stimulation, Spinal manipulation, Spinal mobilization, Cryotherapy, Moist heat, Compression bandaging, scar mobilization, Splintting, Taping, Traction, Ultrasound, Ionotophoresis 4mg /ml Dexamethasone, and Manual therapy .  PLAN FOR NEXT SESSION: Continue with  pelvic tilt, lumbar stabilization Progress to  prone and standing activity. Rayetta Humphrey, [place] [phone]  [phone]

## 2022-02-03 ENCOUNTER — Ambulatory Visit (HOSPITAL_COMMUNITY): Payer: Medicare HMO | Admitting: Physical Therapy

## 2022-02-03 DIAGNOSIS — M5416 Radiculopathy, lumbar region: Secondary | ICD-10-CM

## 2022-02-03 DIAGNOSIS — M6281 Muscle weakness (generalized): Secondary | ICD-10-CM | POA: Diagnosis not present

## 2022-02-03 NOTE — Therapy (Signed)
OUTPATIENT PHYSICAL THERAPY THORACOLUMBAR EVALUATION   Patient Name: [name] MRN: [id number] DOB:05/30/1940, 82 y.o., female Today's Date: 02/03/2022  END OF SESSION:  PT End of Session - 02/03/22 1510     Visit Number 5    Number of Visits 8    Date for PT Re-Evaluation 02/21/22    Authorization Type [employer]    Progress Note Due on Visit 8    PT Start Time 1430    PT Stop Time 1510    PT Time Calculation (min) 40 min    Activity Tolerance Patient tolerated treatment well    Behavior During Therapy [facility] for tasks assessed/performed                 Past Medical History:  Diagnosis Date   B12 deficiency    managed by Dr. Willey Blade: monthly injections   Hematuria 05/11/2015   Hemorrhoids    Hyperlipemia    Impaired fasting glucose    Neuropathy    hereditary and idiopathic   Osteoarthritis    Pelvic pressure in female 05/11/2015   PONV (postoperative nausea and vomiting)    Transient cerebral ischemic attack    no impairment   Vaginal atrophy 05/11/2015   Past Surgical History:  Procedure Laterality Date   ABDOMINAL HYSTERECTOMY     ANKLE FRACTURE SURGERY     left   APPENDECTOMY     CATARACT EXTRACTION     COLONOSCOPY  09/30/2003   RMR: Normal colon. Subtle abnormalities in the ileocecal valve and terminal  ileum as described above of doubtful clinical significance/Normal rectum   COLONOSCOPY N/A 11/03/2013   Procedure: COLONOSCOPY;  Surgeon: Daneil Dolin, MD;  [facility];  Service: Endoscopy;  Laterality: N/A;  830 - moved to 10:45 - Ginger to notify pt   REVERSE SHOULDER ARTHROPLASTY Right 07/02/2018   REVERSE SHOULDER ARTHROPLASTY Right 07/02/2018   Procedure: RIGHT REVERSE SHOULDER ARTHROPLASTY;  Surgeon: Meredith Pel, MD;  Location: [place];  Service: Orthopedics;  Laterality: Right;   right leg fracture surgery     right   TUBAL LIGATION     Patient Active Problem List   Diagnosis Date Noted   Shoulder arthritis 07/02/2018    Colles' fracture of right radius 09/04/17 09/20/2017   Pelvic pressure in female 05/11/2015   Hematuria 05/11/2015   Vaginal atrophy 05/11/2015   Encounter for screening colonoscopy 10/08/2013   Ankle arthritis 05/19/2013   Left ankle strain 05/19/2013   Left ankle pain 05/19/2013   PRIMARY LOCALIZED OSTEOARTHROSIS LOWER LEG 04/15/2008   BUCKET HANDLE TEAR OF LATERAL MENISCUS 04/15/2008   TEAR LATERAL MENISCUS 04/15/2008   KNEE, ARTHRITIS, DEGEN./OSTEO 01/08/2008   DERANGEMENT MENISCUS 01/08/2008   LOOSE BODY-KNEE 01/08/2008   KNEE PAIN 01/08/2008    PCP: Dr. Asencion Noble  REFERRING PROVIDER: Carole Civil, [place]  REFERRING DIAG: [phone] (ICD-10-CM) - Bilateral hip pain M54.50 (ICD-10-CM) - Lumbar pain M54.10 (ICD-10-CM) - Radicular pain of both lower extremities  Rationale for Evaluation and Treatment: Rehabilitation  THERAPY DIAG:  Radiculopathy, lumbar region  Muscle weakness (generalized)  ONSET DATE: 11/23/21  SUBJECTIVE:  SUBJECTIVE STATEMENT: Pt states that she has been doing her exercises and she cleaned her house so her pain is up a bit.   PERTINENT HISTORY:  OA, TIA   PAIN:  Are you having pain? Yes: NPRS scale: 7/10 Pain location: back and RT lateral hip  Pain description: constant throb Aggravating factors: Weight bearing  Relieving factors: sitting   PRECAUTIONS: None  WEIGHT BEARING RESTRICTIONS: No  PATIENT GOALS: less pain, to be able to move easier    OBJECTIVE:   DIAGNOSTIC FINDINGS:  Impression   1.  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  Abnormal coronal and sagittal alignment   3.  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  Facet arthritis L3-S1  PATIENT SURVEYS:  FOTO 40  MUSCLE LENGTH: Hamstrings:  Right 170 deg; Left 165 deg   POSTURE: decreased lumbar lordosis, increased thoracic kyphosis, and flexed trunk   PALPATION: RT paraspinal mm is tight.   LUMBAR ROM:   AROM eval  Flexion Able to touch toes reps no change    Extension 20 reps no changes   Right lateral flexion   Left lateral flexion   Right rotation   Left rotation    (Blank rows = not tested)  LOWER EXTREMITY MMT:    MMT Right eval Left eval  Hip flexion 3+ 4+  Hip extension 3- 3  Hip abduction 3 5  Hip adduction    Hip internal rotation    Hip external rotation    Knee flexion 3+ 4+  Knee extension 5 5  Ankle dorsiflexion 5 5  Ankle plantarflexion    Ankle inversion    Ankle eversion     (Blank rows = not tested)    FUNCTIONAL TESTS:  30 seconds chair stand test: 8 below average for healthy 82 yo  2 minute walk test : 368 ft no assistive device    TODAY'S TREATMENT:                                                                                                                              DATE:  02/03/22 Heel raise x 10  Functional squat x 10 Wall arch x 10 B UE flexion x 10 Sit to stand x 10  Sitting W back with 2 # x 10  POE x 1 minute Heel squeeze x 10 Single leg raise x 10 Quad stretch passive B Side lying: Clam x 10  Hip abduction x 10 Supine:double knee to chest x 30" x 3  Bridge x 15  Dead bug x 15  Feb 01, 2022: Standing: Wall arch x 10 Stabilize back while completing B UE flexion x 10 Supine: Bridge x 15 for 5"  Dead bug x 10  Piriformis stretch 3 x 30" Bilateral Knee to chest   x 3 LTR x 5 Sidelying: Hip abduction: B  Clam: B  Sitting: thoracic excursion x 3 01/26/22 Supine: Decompression exercises 1-5 Theraband decompression exercises  red : Pull down, overhead, ER and sash all x 5 Abdominal set x 10  Bridge x 10 Active hamstring stretch x 10   Clam x10 B Bridge x 10  Roll to RT x5 Roll to LT x 5 Sidelying to si  01/12/22 Heel slide x15 Bridge 15 x  5" Hip abduction iso 15 x 5" Hip adduction iso 15 x 5" LTR 10 x 5" Ab brace 15 x 5" Ab march x20  12/28/21 physical therapy evaluation and HEP instruction                               Ab set x `10                              Bridge x 10                              Knee to chest 3 x 20" B   PATIENT EDUCATION:  Education details: Patient educated on exam findings, POC, scope of PT, HEP, and posture. Person educated: Patient Education method: Explanation, Demonstration, and Handouts Education comprehension: verbalized understanding, returned demonstration, verbal cues required, and tactile cues required   HOME EXERCISE PROGRAM: Access Code: [place] with Counter Support  - 1 x daily - 7 x weekly - 1 sets - 10 reps - 5-10 hold - Mini Squat  - 1 x daily - 7 x weekly - 1 sets - 10 reps - 5-10 hold - Sit to Stand  - 1 x daily - 7 x weekly - 1 sets - 10 reps - 5-10 hold - Prone Gluteal Sets  - 1 x daily - 7 x weekly - 1 sets - 10 reps - 5-10 hold - Prone Heel Squeeze  - 1 x daily - 7 x weekly - 1 sets - 10 reps - 5-10 hold 02/03/22  01/31/2022 Sidelying: Hip abduction x 10  Clam x 10  URL: [email or web address] 01/26/22- Bent Knee Fallouts  - 1 x daily - 7 x weekly - 1 sets - 10 reps - Supine Hamstring Stretch  - 1 x daily - 7 x weekly - 1 sets - 3 reps - 20-30" hold Decompression ex T band decompression ex  t  01/12/22 Access Code: [email or web address] URL: [email or web address] Date: 01/12/2022 Prepared by: Josue Hector  Exercises - Supine Transversus Abdominis Bracing - Hands on Stomach  - 5 x daily - 7 x weekly - 1 sets - 10 reps - 5" hold - Beginner Bridge  - 3 x daily - 7 x weekly - 1 sets - 10 reps - 5" hold - Supine Hip Adduction Isometric with Ball  - 3 x daily - 7 x weekly - 1 sets - 10 reps - 5" hold - Hooklying Isometric Hip Abduction with Belt  - 3 x daily - 7 x weekly - 1 sets - 10 reps - 5" hold  Date: 12/28/2021 Prepared by:  Rayetta Humphrey  Exercises - Supine Transversus Abdominis Bracing - Hands on Stomach  - 5 x daily - 7 x weekly - 1 sets - 10 reps - 5" hold - Supine Single Knee to Chest Stretch  - 2 x daily - 7 x weekly - 1 sets - 3 reps - 20" hold - Beginner Bridge  - 2 x daily - 7 x weekly -  1 sets - 10 reps - 5" hold  ASSESSMENT:  CLINICAL IMPRESSION: Added both standing as well as prone stabilization exercises with good form noted. PT should be ready for discharge in three visits.   Patient will continue to benefit from skilled therapy services to reduce remaining deficits and improve functional ability.    OBJECTIVE IMPAIRMENTS: decreased activity tolerance, difficulty walking, decreased strength, postural dysfunction, and pain.   ACTIVITY LIMITATIONS: carrying, lifting, standing, and locomotion level  PARTICIPATION LIMITATIONS: cleaning, shopping, community activity, and church  PERSONAL FACTORS: 1 comorbidity: OA  are also affecting patient's functional outcome.   REHAB POTENTIAL: Good  CLINICAL DECISION MAKING: Stable/uncomplicated  EVALUATION COMPLEXITY: Low   GOALS: Goals reviewed with patient?yes   SHORT TERM GOALS: Target date: 01/11/22  Patient will be independent with initial HEP Baseline: Goal status: MET  2.  PT pain level to be no greater than a 7/10 Baseline:  Goal status: MET  3.  PT LE strength to be increased 1/2 grade  to be able to stand/walk for 30 minutes for shopping activity without increased pain.  Baseline:  Goal status: MET  LONG TERM GOALS: Target date: 01/25/22  Patient will be independent in self management strategies to improve quality of life and functional outcomes.  Baseline:  Goal status: MET  2.  PT pain level to be no greater than a 5/10 Baseline:  Goal status: IN PROGRESS  3.  PT LE strength to be increased 1 grade  to be able to stand/walk for 60 minutes for shopping activity without increased pain Baseline:  Goal status: IN  PROGRESS    PLAN:  PT FREQUENCY: 2x/week  PT DURATION: 4 weeks  PLANNED INTERVENTIONS:  Therapeutic exercises, Therapeutic activity, Neuromuscular re-education, Balance training, Gait training, Patient/Family education, Joint manipulation, Joint mobilization, Stair training, Orthotic/Fit training, DME instructions, Aquatic Therapy, Dry Needling, Electrical stimulation, Spinal manipulation, Spinal mobilization, Cryotherapy, Moist heat, Compression bandaging, scar mobilization, Splintting, Taping, Traction, Ultrasound, Ionotophoresis 4mg /ml Dexamethasone, and Manual therapy .  PLAN FOR NEXT SESSION: Continue with lumbar stabilization  , PT CLT [phone]  [phone]

## 2022-02-09 ENCOUNTER — Ambulatory Visit (HOSPITAL_COMMUNITY): Payer: Medicare HMO | Admitting: Physical Therapy

## 2022-02-09 DIAGNOSIS — M6281 Muscle weakness (generalized): Secondary | ICD-10-CM | POA: Diagnosis not present

## 2022-02-09 DIAGNOSIS — M5416 Radiculopathy, lumbar region: Secondary | ICD-10-CM | POA: Diagnosis not present

## 2022-02-09 NOTE — Therapy (Addendum)
OUTPATIENT PHYSICAL THERAPY THORACOLUMBAR TREATMENT   Patient Name: [name] MRN: [id number] DOB:[birth year], 82 y.o., female Today's Date: 02/09/2022 PHYSICAL THERAPY DISCHARGE SUMMARY  Visits from Start of Care: 6  Current functional level related to goals / functional outcomes: See below   Remaining deficits: See below   Education / Equipment: See assessment    Patient agrees to discharge. Patient goals were partially met. Patient is being discharged due to maximized rehab potential.   END OF SESSION:   PT End of Session - 02/09/22 1524     Visit Number 6    Number of Visits 8    Date for PT Re-Evaluation 02/21/22    Authorization Type [employer]    Progress Note Due on Visit 8    PT Start Time 1522    PT Stop Time 1550    PT Time Calculation (min) 28 min    Activity Tolerance Patient tolerated treatment well    Behavior During Therapy [facility] for tasks assessed/performed               Past Medical History:  Diagnosis Date   B12 deficiency    managed by Dr. Willey Blade: monthly injections   Hematuria 05/11/2015   Hemorrhoids    Hyperlipemia    Impaired fasting glucose    Neuropathy    hereditary and idiopathic   Osteoarthritis    Pelvic pressure in female 05/11/2015   PONV (postoperative nausea and vomiting)    Transient cerebral ischemic attack    no impairment   Vaginal atrophy 05/11/2015   Past Surgical History:  Procedure Laterality Date   ABDOMINAL HYSTERECTOMY     ANKLE FRACTURE SURGERY     left   APPENDECTOMY     CATARACT EXTRACTION     COLONOSCOPY  09/30/2003   RMR: Normal colon. Subtle abnormalities in the ileocecal valve and terminal  ileum as described above of doubtful clinical significance/Normal rectum   COLONOSCOPY N/A 11/03/2013   Procedure: COLONOSCOPY;  Surgeon: Daneil Dolin, MD;  [facility];  Service: Endoscopy;  Laterality: N/A;  830 - moved to 10:45 - Ginger to notify pt   REVERSE SHOULDER ARTHROPLASTY Right  07/02/2018   REVERSE SHOULDER ARTHROPLASTY Right 07/02/2018   Procedure: RIGHT REVERSE SHOULDER ARTHROPLASTY;  Surgeon: Meredith Pel, MD;  Location: [place];  Service: Orthopedics;  Laterality: Right;   right leg fracture surgery     right   TUBAL LIGATION     Patient Active Problem List   Diagnosis Date Noted   Shoulder arthritis 07/02/2018   Colles' fracture of right radius 09/04/17 09/20/2017   Pelvic pressure in female 05/11/2015   Hematuria 05/11/2015   Vaginal atrophy 05/11/2015   Encounter for screening colonoscopy 10/08/2013   Ankle arthritis 05/19/2013   Left ankle strain 05/19/2013   Left ankle pain 05/19/2013   PRIMARY LOCALIZED OSTEOARTHROSIS LOWER LEG 04/15/2008   BUCKET HANDLE TEAR OF LATERAL MENISCUS 04/15/2008   TEAR LATERAL MENISCUS 04/15/2008   KNEE, ARTHRITIS, DEGEN./OSTEO 01/08/2008   DERANGEMENT MENISCUS 01/08/2008   LOOSE BODY-KNEE 01/08/2008   KNEE PAIN 01/08/2008    PCP: Dr. Asencion Noble  REFERRING PROVIDER: Carole Civil, [place]  REFERRING DIAG: [phone] (ICD-10-CM) - Bilateral hip pain M54.50 (ICD-10-CM) - Lumbar pain M54.10 (ICD-10-CM) - Radicular pain of both lower extremities  Rationale for Evaluation and Treatment: Rehabilitation  THERAPY DIAG:  Radiculopathy, lumbar region  Muscle weakness (generalized)  ONSET DATE: 11/23/21  SUBJECTIVE:  SUBJECTIVE STATEMENT: Patient states [facility] and did some exercise but she could feel it in her back. Overall she feels about the same. She is in process of scheduling MRI but not sure when. She is not taking pain medicine but takes Tylenol PRN.   PERTINENT HISTORY:  OA, TIA   PAIN:  Are you having pain? Yes: NPRS scale: 7/10 Pain location: back and RT  lateral hip  Pain description: constant throb Aggravating factors: Weight bearing  Relieving factors: sitting   PRECAUTIONS: None  WEIGHT BEARING RESTRICTIONS: No  PATIENT GOALS: less pain, to be able to move easier    OBJECTIVE:   DIAGNOSTIC FINDINGS:  Impression   1.  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  Abnormal coronal and sagittal alignment   3.  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  Facet arthritis L3-S1  PATIENT SURVEYS:  FOTO 43% ( was 40)  MUSCLE LENGTH: Hamstrings: Right 170 deg; Left 165 deg   POSTURE: decreased lumbar lordosis, increased thoracic kyphosis, and flexed trunk   PALPATION: RT paraspinal mm is tight.   LUMBAR ROM:   AROM eval  Flexion Able to touch toes reps no change    Extension 20 reps no changes   Right lateral flexion   Left lateral flexion   Right rotation   Left rotation    (Blank rows = not tested)  LOWER EXTREMITY MMT:    MMT Right eval Left eval Right 02/09/22 Left 02/09/22  Hip flexion 3+ 4+ 4+ 5  Hip extension 3- 3 4+ 4+  Hip abduction 3 5 4+ 4+  Hip adduction      Hip internal rotation      Hip external rotation      Knee flexion 3+ 4+    Knee extension 5 5 5 5   Ankle dorsiflexion 5 5 5 5   Ankle plantarflexion      Ankle inversion      Ankle eversion       (Blank rows = not tested)    FUNCTIONAL TESTS:  30 seconds chair stand test: 8 below average for healthy 82 yo  2 minute walk test : 368 ft no assistive device    TODAY'S TREATMENT:                                                                                                                              DATE:   02/09/22 Reassess and DC FOTO MMT HEP review  02/03/22 Heel raise x 10  Functional squat x 10 Wall arch x 10 B UE flexion x 10 Sit to stand x 10  Sitting W back with 2 # x 10  POE x 1 minute Heel squeeze x 10 Single leg raise x 10 Quad stretch passive B Side lying: Clam x 10  Hip abduction x 10 Supine:double knee  to chest x 30" x 3  Bridge x 15  Dead bug x 15  01/31/2022: Standing: Wall arch x 10 Stabilize back while completing B UE flexion x 10 Supine: Bridge x 15 for 5"  Dead bug x 10  Piriformis stretch 3 x 30" Bilateral Knee to chest   x 3 LTR x 5 Sidelying: Hip abduction: B  Clam: B  Sitting: thoracic excursion x 3   PATIENT EDUCATION:  Education details: Patient educated on exam findings, POC, scope of PT, HEP, and posture. Person educated: Patient Education method: Explanation, Demonstration, and Handouts Education comprehension: verbalized understanding, returned demonstration, verbal cues required, and tactile cues required   HOME EXERCISE PROGRAM: Access Code: [email or web address] URL: [email or web address] Date: 02/09/2022 Prepared by: Georges Lynch  Exercises - Supine Transversus Abdominis Bracing - Hands on Stomach  - 2 x daily - 5-7 x weekly - 1 sets - 10 reps - 5" hold - Beginner Bridge  - 1-2 x daily - 5-7 x weekly - 1 sets - 10 reps - 5" hold - Supine Hip Adduction Isometric with Ball  - 1-2 x daily - 5-7 x weekly - 1 sets - 10 reps - 5" hold - Hooklying Isometric Hip Abduction with Belt  - 1-2 x daily - 5-7 x weekly - 1 sets - 10 reps - 5" hold - Bent Knee Fallouts  - 1-2 x daily - 5-7 x weekly - 1 sets - 10 reps - Supine Hamstring Stretch  - 1-2 x daily - 5-7 x weekly - 1 sets - 3 reps - 20-30" hold - Sidelying Pelvic Floor Contraction with Hip Abduction  - 1-2 x daily - 5-7 x weekly - 1 sets - 10 reps - 5-10" hold - Heel Raises with Counter Support  - 1-2 x daily - 5-7 x weekly - 1 sets - 10 reps - 5-10 hold - Mini Squat  - 1-2 x daily - 5-7 x weekly - 1 sets - 10 reps - 5-10 hold - Prone Gluteal Sets  - 1-2 x daily - 5-7 x weekly - 1 sets - 10 reps - 5-10 hold - Prone Heel Squeeze  - 1-2 x daily - 5-7 x weekly - 1 sets - 10 reps - 5-10 hold  Access Code: [email or web address]- Heel Raises with Counter Support  - 1 x daily - 7 x weekly - 1 sets - 10 reps - 5-10 hold -  Mini Squat  - 1 x daily - 7 x weekly - 1 sets - 10 reps - 5-10 hold - Sit to Stand  - 1 x daily - 7 x weekly - 1 sets - 10 reps - 5-10 hold - Prone Gluteal Sets  - 1 x daily - 7 x weekly - 1 sets - 10 reps - 5-10 hold - Prone Heel Squeeze  - 1 x daily - 7 x weekly - 1 sets - 10 reps - 5-10 hold 02/03/22  01/31/2022 Sidelying: Hip abduction x 10  Clam x 10  URL: [email or web address] 01/26/22- Bent Knee Fallouts  - 1 x daily - 7 x weekly - 1 sets - 10 reps - Supine Hamstring Stretch  - 1 x daily - 7 x weekly - 1 sets - 3 reps - 20-30" hold Decompression ex T band decompression ex  t  01/12/22  Access Code: [email or web address] URL: [email or web address] Date: 01/12/2022 Prepared by: Georges Lynch  Exercises - Supine Transversus Abdominis Bracing - Hands on Stomach  - 5 x daily - 7 x weekly - 1 sets - 10 reps - 5" hold - Beginner Bridge  -  3 x daily - 7 x weekly - 1 sets - 10 reps - 5" hold - Supine Hip Adduction Isometric with Ball  - 3 x daily - 7 x weekly - 1 sets - 10 reps - 5" hold - Hooklying Isometric Hip Abduction with Belt  - 3 x daily - 7 x weekly - 1 sets - 10 reps - 5" hold  Date: 12/28/2021 Prepared by: Virgina Organ  Exercises - Supine Transversus Abdominis Bracing - Hands on Stomach  - 5 x daily - 7 x weekly - 1 sets - 10 reps - 5" hold - Supine Single Knee to Chest Stretch  - 2 x daily - 7 x weekly - 1 sets - 3 reps - 20" hold - Beginner Bridge  - 2 x daily - 7 x weekly - 1 sets - 10 reps - 5" hold  ASSESSMENT:  CLINICAL IMPRESSION: Patient has made good objective progress overall. Demos significantly improved MMT and functional mobility. Patient continues to be limited primarily by pain in low back. Hip pain has improved significantly. At this time patient will be DC due to max benefit reached with therapy goals partially MET. Reviewed comprehensive HEP and answered all patient questions. Encouraged patient to follow up with therapy services with  any further questions or concerns.   OBJECTIVE IMPAIRMENTS: decreased activity tolerance, difficulty walking, decreased strength, postural dysfunction, and pain.   ACTIVITY LIMITATIONS: carrying, lifting, standing, and locomotion level  PARTICIPATION LIMITATIONS: cleaning, shopping, community activity, and church  PERSONAL FACTORS: 1 comorbidity: OA  are also affecting patient's functional outcome.   REHAB POTENTIAL: Good  CLINICAL DECISION MAKING: Stable/uncomplicated  EVALUATION COMPLEXITY: Low   GOALS: Goals reviewed with patient?yes   SHORT TERM GOALS: Target date: 01/11/22  Patient will be independent with initial HEP Baseline: Goal status: MET  2.  PT pain level to be no greater than a 7/10 Baseline:  Goal status: MET  3.  PT LE strength to be increased 1/2 grade  to be able to stand/walk for 30 minutes for shopping activity without increased pain.  Baseline:  Goal status: MET  LONG TERM GOALS: Target date: 01/25/22  Patient will be independent in self management strategies to improve quality of life and functional outcomes.  Baseline:  Goal status: MET  2.  PT pain level to be no greater than a 5/10 Baseline: unchanged at 7/10  Goal status: NOT MET  3.  PT LE strength to be increased 1 grade to be able to stand/walk for 60 minutes for shopping activity without increased pain Baseline: See MMT Goal status: MET    PLAN:  PT FREQUENCY: 2x/week  PT DURATION: 4 weeks  PLANNED INTERVENTIONS:  Therapeutic exercises, Therapeutic activity, Neuromuscular re-education, Balance training, Gait training, Patient/Family education, Joint manipulation, Joint mobilization, Stair training, Orthotic/Fit training, DME instructions, Aquatic Therapy, Dry Needling, Electrical stimulation, Spinal manipulation, Spinal mobilization, Cryotherapy, Moist heat, Compression bandaging, scar mobilization, Splintting, Taping, Traction, Ultrasound, Ionotophoresis 4mg /ml Dexamethasone, and  Manual therapy .  PLAN FOR NEXT SESSION: DC to HEP   3:57 PM, 02/09/22 02/11/22 PT DPT  Physical Therapist with [facility]  [phone]

## 2022-02-15 ENCOUNTER — Encounter (HOSPITAL_COMMUNITY): Payer: Medicare HMO | Admitting: Physical Therapy

## 2022-02-16 ENCOUNTER — Ambulatory Visit (HOSPITAL_COMMUNITY)
Admission: RE | Admit: 2022-02-16 | Discharge: 2022-02-16 | Disposition: A | Discharge status: Home / Self Care | Payer: Medicare HMO | Source: Ambulatory Visit | Attending: Orthopedic Surgery | Admitting: Orthopedic Surgery

## 2022-02-16 DIAGNOSIS — M5410 Radiculopathy, site unspecified: Secondary | ICD-10-CM

## 2022-02-16 DIAGNOSIS — M545 Low back pain, unspecified: Secondary | ICD-10-CM | POA: Diagnosis not present

## 2022-02-16 DIAGNOSIS — M47816 Spondylosis without myelopathy or radiculopathy, lumbar region: Secondary | ICD-10-CM | POA: Diagnosis not present

## 2022-02-16 DIAGNOSIS — M48062 Spinal stenosis, lumbar region with neurogenic claudication: Secondary | ICD-10-CM | POA: Diagnosis not present

## 2022-02-16 DIAGNOSIS — M48061 Spinal stenosis, lumbar region without neurogenic claudication: Secondary | ICD-10-CM | POA: Diagnosis not present

## 2022-02-16 DIAGNOSIS — M5126 Other intervertebral disc displacement, lumbar region: Secondary | ICD-10-CM | POA: Diagnosis not present

## 2022-02-23 ENCOUNTER — Telehealth: Payer: Self-pay | Admitting: Orthopedic Surgery

## 2022-02-23 NOTE — Telephone Encounter (Signed)
  IMPRESSION: 1. Diffuse lumbar spine spondylosis as described above. 2. No acute osseous injury of the lumbar spine.

## 2022-02-23 NOTE — Telephone Encounter (Signed)
Patient called at 12:10 pm lvm stating she has had her MRI last Thursday and no one has called her back to tell her anything.  Call her back at [phone]

## 2022-03-03 DIAGNOSIS — G629 Polyneuropathy, unspecified: Secondary | ICD-10-CM | POA: Diagnosis not present

## 2022-03-03 DIAGNOSIS — I10 Essential (primary) hypertension: Secondary | ICD-10-CM | POA: Diagnosis not present

## 2022-03-07 DIAGNOSIS — D519 Vitamin B12 deficiency anemia, unspecified: Secondary | ICD-10-CM | POA: Diagnosis not present

## 2022-03-09 ENCOUNTER — Ambulatory Visit: Payer: Medicare HMO | Admitting: Orthopedic Surgery

## 2022-03-09 DIAGNOSIS — M48062 Spinal stenosis, lumbar region with neurogenic claudication: Secondary | ICD-10-CM

## 2022-03-09 NOTE — Progress Notes (Signed)
Chief Complaint  Patient presents with   Results    MRI FU     81 year old female with chronic back pain treated with ibuprofen and physical therapy no improvement  Still has bilateral lower back and bilateral hip pain some right groin pain with x-rays showing very little arthritis in the right hip  We finally sent her for an MRI and it came back spinal stenosis at multiple levels  I offered her treatment options which included spinal injection, continue current management with home exercises.  The patient is amenable to trying epidural injections of the lumbar spine  Follow-up after injections if they do not work  Disc levels:   Disc spaces: Disc desiccation throughout the lumbar spine. Disc height loss at L4-5 and L5-S1. Disc height loss at T12-L1.   T10-11: Mild broad-based disc bulge. No foraminal or central canal stenosis.   T11-12: Mild broad-based disc bulge. Mild bilateral facet arthropathy. No foraminal or central canal stenosis.   T12-L1: Mild broad-based disc bulge. Mild bilateral facet arthropathy. No foraminal or central canal stenosis.   L1-L2: Broad-based disc bulge. Moderate bilateral facet arthropathy. Mild spinal stenosis. Bilateral lateral recess stenosis. Moderate right and mild left foraminal stenosis.   L2-L3: Broad-based disc bulge. Moderate bilateral facet arthropathy. Bilateral lateral recess stenosis. Mild spinal stenosis. Mild bilateral foraminal stenosis.   L3-L4: Broad-based disc bulge. Severe bilateral facet arthropathy. Severe spinal stenosis. Mild bilateral foraminal stenosis.   L4-L5: Broad-based disc bulge. Severe bilateral facet arthropathy. Severe spinal stenosis. No foraminal stenosis.   L5-S1: Mild broad-based disc bulge. Moderate bilateral facet arthropathy. Moderate right foraminal stenosis. No left foraminal stenosis. No spinal stenosis.   IMPRESSION: 1. Diffuse lumbar spine spondylosis as described above. 2. No acute  osseous injury of the lumbar spine.     Electronically Signed   By: Kathreen Devoid M.D.   On: 02/17/2022 06:33

## 2022-03-09 NOTE — Patient Instructions (Signed)
[facility] [id number]  is the phone number for the injections

## 2022-03-15 ENCOUNTER — Other Ambulatory Visit: Payer: Self-pay | Admitting: Orthopedic Surgery

## 2022-03-15 DIAGNOSIS — M48062 Spinal stenosis, lumbar region with neurogenic claudication: Secondary | ICD-10-CM

## 2022-03-23 ENCOUNTER — Encounter: Payer: Self-pay | Admitting: Radiology

## 2022-03-23 ENCOUNTER — Ambulatory Visit
Admission: RE | Admit: 2022-03-23 | Discharge: 2022-03-23 | Disposition: A | Discharge status: Home / Self Care | Payer: Medicare HMO | Source: Ambulatory Visit | Attending: Orthopedic Surgery | Admitting: Orthopedic Surgery

## 2022-03-23 DIAGNOSIS — M4727 Other spondylosis with radiculopathy, lumbosacral region: Secondary | ICD-10-CM | POA: Diagnosis not present

## 2022-03-23 DIAGNOSIS — M48061 Spinal stenosis, lumbar region without neurogenic claudication: Secondary | ICD-10-CM | POA: Diagnosis not present

## 2022-03-23 DIAGNOSIS — M48062 Spinal stenosis, lumbar region with neurogenic claudication: Secondary | ICD-10-CM

## 2022-03-23 MED ORDER — METHYLPREDNISOLONE ACETATE 40 MG/ML INJ SUSP (RADIOLOG
80.0000 mg | Freq: Once | INTRAMUSCULAR | Status: AC
  Administered 2022-03-23: 80 mg via EPIDURAL

## 2022-03-23 MED ORDER — IOPAMIDOL (ISOVUE-M 200) INJECTION 41%
1.0000 mL | Freq: Once | INTRAMUSCULAR | Status: AC
Start: 2022-03-23 — End: 2022-03-23
  Administered 2022-03-23: 1 mL via EPIDURAL

## 2022-03-23 NOTE — Discharge Instructions (Signed)

## 2022-04-11 DIAGNOSIS — D519 Vitamin B12 deficiency anemia, unspecified: Secondary | ICD-10-CM | POA: Diagnosis not present

## 2022-04-18 ENCOUNTER — Other Ambulatory Visit (HOSPITAL_COMMUNITY): Payer: Self-pay | Admitting: Internal Medicine

## 2022-04-18 DIAGNOSIS — Z1231 Encounter for screening mammogram for malignant neoplasm of breast: Secondary | ICD-10-CM

## 2022-04-26 DIAGNOSIS — Z823 Family history of stroke: Secondary | ICD-10-CM | POA: Diagnosis not present

## 2022-04-26 DIAGNOSIS — Z008 Encounter for other general examination: Secondary | ICD-10-CM | POA: Diagnosis not present

## 2022-04-26 DIAGNOSIS — G629 Polyneuropathy, unspecified: Secondary | ICD-10-CM | POA: Diagnosis not present

## 2022-04-26 DIAGNOSIS — I10 Essential (primary) hypertension: Secondary | ICD-10-CM | POA: Diagnosis not present

## 2022-04-26 DIAGNOSIS — M1990 Unspecified osteoarthritis, unspecified site: Secondary | ICD-10-CM | POA: Diagnosis not present

## 2022-04-26 DIAGNOSIS — Z833 Family history of diabetes mellitus: Secondary | ICD-10-CM | POA: Diagnosis not present

## 2022-04-26 DIAGNOSIS — E785 Hyperlipidemia, unspecified: Secondary | ICD-10-CM | POA: Diagnosis not present

## 2022-04-26 DIAGNOSIS — Z8249 Family history of ischemic heart disease and other diseases of the circulatory system: Secondary | ICD-10-CM | POA: Diagnosis not present

## 2022-04-26 DIAGNOSIS — M4800 Spinal stenosis, site unspecified: Secondary | ICD-10-CM | POA: Diagnosis not present

## 2022-04-30 ENCOUNTER — Other Ambulatory Visit: Payer: Self-pay | Admitting: Orthopedic Surgery

## 2022-05-02 DIAGNOSIS — H40003 Preglaucoma, unspecified, bilateral: Secondary | ICD-10-CM | POA: Diagnosis not present

## 2022-05-02 DIAGNOSIS — H52203 Unspecified astigmatism, bilateral: Secondary | ICD-10-CM | POA: Diagnosis not present

## 2022-05-02 DIAGNOSIS — H5213 Myopia, bilateral: Secondary | ICD-10-CM | POA: Diagnosis not present

## 2022-05-02 DIAGNOSIS — H524 Presbyopia: Secondary | ICD-10-CM | POA: Diagnosis not present

## 2022-05-02 DIAGNOSIS — Z961 Presence of intraocular lens: Secondary | ICD-10-CM | POA: Diagnosis not present

## 2022-05-15 ENCOUNTER — Ambulatory Visit (HOSPITAL_COMMUNITY): Payer: Medicare HMO

## 2022-05-17 ENCOUNTER — Ambulatory Visit (HOSPITAL_COMMUNITY): Payer: Medicare HMO

## 2022-05-19 ENCOUNTER — Encounter (HOSPITAL_COMMUNITY): Payer: Self-pay

## 2022-05-19 ENCOUNTER — Ambulatory Visit (HOSPITAL_COMMUNITY)
Admission: RE | Admit: 2022-05-19 | Discharge: 2022-05-19 | Disposition: A | Discharge status: Home / Self Care | Payer: Medicare HMO | Source: Ambulatory Visit | Attending: Internal Medicine | Admitting: Internal Medicine

## 2022-05-19 DIAGNOSIS — Z1231 Encounter for screening mammogram for malignant neoplasm of breast: Secondary | ICD-10-CM | POA: Insufficient documentation

## 2022-06-13 DIAGNOSIS — D519 Vitamin B12 deficiency anemia, unspecified: Secondary | ICD-10-CM | POA: Diagnosis not present

## 2022-07-03 DIAGNOSIS — G629 Polyneuropathy, unspecified: Secondary | ICD-10-CM | POA: Diagnosis not present

## 2022-07-03 DIAGNOSIS — I10 Essential (primary) hypertension: Secondary | ICD-10-CM | POA: Diagnosis not present

## 2022-07-18 DIAGNOSIS — D519 Vitamin B12 deficiency anemia, unspecified: Secondary | ICD-10-CM | POA: Diagnosis not present

## 2022-08-29 DIAGNOSIS — D519 Vitamin B12 deficiency anemia, unspecified: Secondary | ICD-10-CM | POA: Diagnosis not present

## 2022-10-26 DIAGNOSIS — I10 Essential (primary) hypertension: Secondary | ICD-10-CM | POA: Diagnosis not present

## 2022-10-26 DIAGNOSIS — M4800 Spinal stenosis, site unspecified: Secondary | ICD-10-CM | POA: Diagnosis not present

## 2022-10-26 DIAGNOSIS — Z79899 Other long term (current) drug therapy: Secondary | ICD-10-CM | POA: Diagnosis not present

## 2022-10-26 DIAGNOSIS — G9009 Other idiopathic peripheral autonomic neuropathy: Secondary | ICD-10-CM | POA: Diagnosis not present

## 2022-11-02 DIAGNOSIS — G629 Polyneuropathy, unspecified: Secondary | ICD-10-CM | POA: Diagnosis not present

## 2022-11-02 DIAGNOSIS — Z0001 Encounter for general adult medical examination with abnormal findings: Secondary | ICD-10-CM | POA: Diagnosis not present

## 2022-11-02 DIAGNOSIS — Z23 Encounter for immunization: Secondary | ICD-10-CM | POA: Diagnosis not present

## 2022-11-02 DIAGNOSIS — I10 Essential (primary) hypertension: Secondary | ICD-10-CM | POA: Diagnosis not present

## 2022-11-02 DIAGNOSIS — M48061 Spinal stenosis, lumbar region without neurogenic claudication: Secondary | ICD-10-CM | POA: Diagnosis not present

## 2022-11-02 DIAGNOSIS — Z0000 Encounter for general adult medical examination without abnormal findings: Secondary | ICD-10-CM | POA: Diagnosis not present

## 2022-11-02 DIAGNOSIS — E785 Hyperlipidemia, unspecified: Secondary | ICD-10-CM | POA: Diagnosis not present

## 2022-11-07 DIAGNOSIS — D519 Vitamin B12 deficiency anemia, unspecified: Secondary | ICD-10-CM | POA: Diagnosis not present

## 2022-11-15 ENCOUNTER — Telehealth: Payer: Self-pay

## 2022-11-15 DIAGNOSIS — M48062 Spinal stenosis, lumbar region with neurogenic claudication: Secondary | ICD-10-CM

## 2022-11-15 NOTE — Telephone Encounter (Signed)
Put in referral per Dr Rexene Edison Will call patient

## 2022-11-15 NOTE — Telephone Encounter (Signed)
I called gave her the number to call and schedule Called spine services to let them know about referral

## 2022-11-15 NOTE — Telephone Encounter (Signed)
Patient stopped by the office to ask if Dr. Romeo Apple would refer her to [facility] for another injection in her back. Stated he told her to just let him know and he would get set up.  Please call and advise

## 2022-11-16 ENCOUNTER — Other Ambulatory Visit: Payer: Self-pay | Admitting: Orthopedic Surgery

## 2022-11-16 ENCOUNTER — Encounter: Payer: Self-pay | Admitting: Orthopedic Surgery

## 2022-11-16 DIAGNOSIS — M48062 Spinal stenosis, lumbar region with neurogenic claudication: Secondary | ICD-10-CM

## 2022-11-21 NOTE — Discharge Instructions (Signed)

## 2022-11-22 ENCOUNTER — Ambulatory Visit
Admission: RE | Admit: 2022-11-22 | Discharge: 2022-11-22 | Disposition: A | Discharge status: Home / Self Care | Payer: Medicare HMO | Source: Ambulatory Visit | Attending: Orthopedic Surgery | Admitting: Orthopedic Surgery

## 2022-11-22 DIAGNOSIS — M5126 Other intervertebral disc displacement, lumbar region: Secondary | ICD-10-CM | POA: Diagnosis not present

## 2022-11-22 DIAGNOSIS — M48062 Spinal stenosis, lumbar region with neurogenic claudication: Secondary | ICD-10-CM | POA: Diagnosis not present

## 2022-11-22 MED ORDER — METHYLPREDNISOLONE ACETATE 40 MG/ML INJ SUSP (RADIOLOG
80.0000 mg | Freq: Once | INTRAMUSCULAR | Status: AC
  Administered 2022-11-22: 80 mg via EPIDURAL

## 2022-11-22 MED ORDER — IOPAMIDOL (ISOVUE-M 200) INJECTION 41%
1.0000 mL | Freq: Once | INTRAMUSCULAR | Status: AC
Start: 2022-11-22 — End: 2022-11-22
  Administered 2022-11-22: 1 mL via EPIDURAL

## 2022-12-19 DIAGNOSIS — D519 Vitamin B12 deficiency anemia, unspecified: Secondary | ICD-10-CM | POA: Diagnosis not present

## 2023-01-09 DIAGNOSIS — D519 Vitamin B12 deficiency anemia, unspecified: Secondary | ICD-10-CM | POA: Diagnosis not present

## 2023-03-06 DIAGNOSIS — D519 Vitamin B12 deficiency anemia, unspecified: Secondary | ICD-10-CM | POA: Diagnosis not present

## 2023-04-10 DIAGNOSIS — D519 Vitamin B12 deficiency anemia, unspecified: Secondary | ICD-10-CM | POA: Diagnosis not present

## 2023-04-23 ENCOUNTER — Other Ambulatory Visit (HOSPITAL_COMMUNITY): Payer: Self-pay | Admitting: Internal Medicine

## 2023-04-23 DIAGNOSIS — Z1231 Encounter for screening mammogram for malignant neoplasm of breast: Secondary | ICD-10-CM

## 2023-04-27 ENCOUNTER — Telehealth: Payer: Self-pay | Admitting: Orthopedic Surgery

## 2023-04-27 DIAGNOSIS — M48062 Spinal stenosis, lumbar region with neurogenic claudication: Secondary | ICD-10-CM

## 2023-04-27 DIAGNOSIS — M5410 Radiculopathy, site unspecified: Secondary | ICD-10-CM

## 2023-04-27 NOTE — Telephone Encounter (Signed)
 Dr. [name] pt - pt lvm, she is requesting an order for [facility] to have the epidural injections again.

## 2023-04-30 NOTE — Telephone Encounter (Signed)
-----   Message from Fuller Canada sent at 04/27/2023 12:24 PM EDT ----- Needs orders for [facility] for injection

## 2023-04-30 NOTE — Telephone Encounter (Signed)
 Put in orders will call [facility] and patient when I can

## 2023-05-01 ENCOUNTER — Telehealth: Payer: Self-pay

## 2023-05-01 NOTE — Telephone Encounter (Signed)
 Patient is checking on status of her referral for epidural injections. She would like a return call please.

## 2023-05-02 ENCOUNTER — Other Ambulatory Visit: Payer: Self-pay | Admitting: Orthopedic Surgery

## 2023-05-02 DIAGNOSIS — M5410 Radiculopathy, site unspecified: Secondary | ICD-10-CM

## 2023-05-02 DIAGNOSIS — M48062 Spinal stenosis, lumbar region with neurogenic claudication: Secondary | ICD-10-CM

## 2023-05-02 NOTE — Telephone Encounter (Signed)
 [facility], will do it hopefully today

## 2023-05-02 NOTE — Telephone Encounter (Signed)
 Called spine services  Left message for them to call her back Called patient and left message for her to call them also may speed up the process.

## 2023-05-08 NOTE — Discharge Instructions (Signed)

## 2023-05-10 ENCOUNTER — Ambulatory Visit
Admission: RE | Admit: 2023-05-10 | Discharge: 2023-05-10 | Disposition: A | Discharge status: Home / Self Care | Source: Ambulatory Visit | Attending: Orthopedic Surgery | Admitting: Orthopedic Surgery

## 2023-05-10 DIAGNOSIS — M5410 Radiculopathy, site unspecified: Secondary | ICD-10-CM

## 2023-05-10 DIAGNOSIS — M48062 Spinal stenosis, lumbar region with neurogenic claudication: Secondary | ICD-10-CM

## 2023-05-10 DIAGNOSIS — M47817 Spondylosis without myelopathy or radiculopathy, lumbosacral region: Secondary | ICD-10-CM | POA: Diagnosis not present

## 2023-05-10 DIAGNOSIS — M48061 Spinal stenosis, lumbar region without neurogenic claudication: Secondary | ICD-10-CM | POA: Diagnosis not present

## 2023-05-10 MED ORDER — METHYLPREDNISOLONE ACETATE 40 MG/ML INJ SUSP (RADIOLOG
80.0000 mg | Freq: Once | INTRAMUSCULAR | Status: AC
  Administered 2023-05-10: 80 mg via EPIDURAL

## 2023-05-10 MED ORDER — IOPAMIDOL (ISOVUE-M 200) INJECTION 41%
1.0000 mL | Freq: Once | INTRAMUSCULAR | Status: AC
  Administered 2023-05-10: 1 mL via EPIDURAL

## 2023-05-21 DIAGNOSIS — H26493 Other secondary cataract, bilateral: Secondary | ICD-10-CM | POA: Diagnosis not present

## 2023-05-21 DIAGNOSIS — Z961 Presence of intraocular lens: Secondary | ICD-10-CM | POA: Diagnosis not present

## 2023-05-23 ENCOUNTER — Ambulatory Visit (HOSPITAL_COMMUNITY)
Admission: RE | Admit: 2023-05-23 | Discharge: 2023-05-23 | Disposition: A | Discharge status: Home / Self Care | Source: Ambulatory Visit | Attending: Internal Medicine | Admitting: Internal Medicine

## 2023-05-23 DIAGNOSIS — Z1231 Encounter for screening mammogram for malignant neoplasm of breast: Secondary | ICD-10-CM | POA: Insufficient documentation

## 2023-05-28 DIAGNOSIS — H26491 Other secondary cataract, right eye: Secondary | ICD-10-CM | POA: Diagnosis not present

## 2023-05-31 DIAGNOSIS — D519 Vitamin B12 deficiency anemia, unspecified: Secondary | ICD-10-CM | POA: Diagnosis not present

## 2023-06-06 DIAGNOSIS — H26492 Other secondary cataract, left eye: Secondary | ICD-10-CM | POA: Diagnosis not present

## 2023-06-06 DIAGNOSIS — Z961 Presence of intraocular lens: Secondary | ICD-10-CM | POA: Diagnosis not present

## 2023-07-17 DIAGNOSIS — D519 Vitamin B12 deficiency anemia, unspecified: Secondary | ICD-10-CM | POA: Diagnosis not present

## 2023-08-28 DIAGNOSIS — D519 Vitamin B12 deficiency anemia, unspecified: Secondary | ICD-10-CM | POA: Diagnosis not present

## 2023-10-23 DIAGNOSIS — D519 Vitamin B12 deficiency anemia, unspecified: Secondary | ICD-10-CM | POA: Diagnosis not present

## 2023-10-31 ENCOUNTER — Ambulatory Visit (INDEPENDENT_AMBULATORY_CARE_PROVIDER_SITE_OTHER): Admitting: Otolaryngology

## 2023-10-31 ENCOUNTER — Encounter (INDEPENDENT_AMBULATORY_CARE_PROVIDER_SITE_OTHER): Payer: Self-pay | Admitting: Otolaryngology

## 2023-10-31 VITALS — BP 111/69 | HR 72 | Temp 97.5°F | Ht 61.0 in | Wt 152.0 lb

## 2023-10-31 DIAGNOSIS — H903 Sensorineural hearing loss, bilateral: Secondary | ICD-10-CM

## 2023-10-31 NOTE — Progress Notes (Signed)
 CC: Asymmetric hearing loss, worse on the left side  Discussed the use of AI scribe software for clinical note transcription with the patient, who gave verbal consent to proceed.  History of Present Illness [name] is an 83 year old female who presents with potential acoustic neuroma. She was referred by her audiologist for evaluation of asymmetric hearing loss.  She was told by her audiologist that she has mild hearing loss in both ears, with the left ear being worse than the right. She has been using hearing aids for about five years, initially obtained through a study at [facility], and recently received new ones this year. She typically wears them when going out but not at home.  She describes a sensation of 'something crawling' in her head over the past month, which she attributes to her nerves. This sensation extends to the top of her head and is accompanied by discomfort in her neck and shoulder. 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  No past ear problems, recent ear infections, ear pain, or drainage. She has not had any surgeries in the ear, nose, or throat area. She has not had an MRI scan of her brain in several years.   Past Medical History:  Diagnosis Date   B12 deficiency    managed by Dr. Sheryle: monthly injections   Hematuria 05/11/2015   Hemorrhoids    Hyperlipemia    Impaired fasting glucose    Neuropathy    hereditary and idiopathic   Osteoarthritis    Pelvic pressure in female 05/11/2015   PONV (postoperative nausea and vomiting)    Transient cerebral ischemic attack    no impairment   Vaginal atrophy 05/11/2015    Past Surgical History:  Procedure Laterality Date   ABDOMINAL HYSTERECTOMY     ANKLE FRACTURE SURGERY     left   APPENDECTOMY     CATARACT EXTRACTION     COLONOSCOPY  09/30/2003   RMR: Normal colon. Subtle abnormalities in the ileocecal valve and terminal  ileum as described  above of doubtful clinical significance/Normal rectum   COLONOSCOPY N/A 11/03/2013   Procedure: COLONOSCOPY;  Surgeon: Lamar CHRISTELLA Hollingshead, MD;  [facility];  Service: Endoscopy;  Laterality: N/A;  830 - moved to 10:45 - Ginger to notify pt   REVERSE SHOULDER ARTHROPLASTY Right 07/02/2018   REVERSE SHOULDER ARTHROPLASTY Right 07/02/2018   Procedure: RIGHT REVERSE SHOULDER ARTHROPLASTY;  Surgeon: Addie Cordella Hamilton, MD;  Location: [facility];  Service: Orthopedics;  Laterality: Right;   right leg fracture surgery     right   TUBAL LIGATION      Family History  Problem Relation Age of Onset   Heart disease Daughter        transposition of great arteries/on fourth pacemaker   Heart disease Mother    Glaucoma Mother    Arthritis Father        rheumatoid   Alcohol abuse Father    Diabetes Father    Glaucoma Sister    Arthritis Brother        rheumatoid   Colon cancer Neg Hx    Colon polyps Neg Hx    Breast cancer Neg Hx    Lung cancer Neg Hx     Social History:  reports that she has never smoked. She has never used smokeless tobacco. She reports that she does not drink alcohol and does not use drugs.  Allergies:  Allergies  Allergen Reactions   Sulfa Antibiotics Nausea And Vomiting    Prior to Admission medications   Medication Sig Start Date End Date Taking? Authorizing Provider  aspirin  EC 81 MG tablet Take 81 mg by mouth daily.    [provider]  Cyanocobalamin  (B-12) 1000 MCG/ML KIT Inject 1,000 mcg as directed every 30 (thirty) days.    [provider]  ibuprofen  (ADVIL ) 800 MG tablet TAKE 1 TABLET(800 MG) BY MOUTH EVERY 8 HOURS AS NEEDED 05/01/22   Margrette Taft BRAVO, MD  MAGNESIUM PO Take 400 mg by mouth at bedtime.    [provider]  meclizine  (ANTIVERT ) 25 MG tablet Take 1 tablet (25 mg total) by mouth 3 (three) times daily as needed for dizziness. 02/17/20   Knapp, Iva, MD  Multiple Vitamin (MULTIVITAMIN) capsule Take 1 capsule by mouth  daily. Centrum    [provider]  olmesartan (BENICAR) 20 MG tablet Take 20 mg by mouth at bedtime.    [provider]  pregabalin  (LYRICA ) 75 MG capsule Take 75 mg by mouth 2 (two) times daily.    [provider]  simvastatin  (ZOCOR ) 10 MG tablet Take 10 mg by mouth at bedtime.    [provider]  tizanidine  (ZANAFLEX ) 2 MG capsule Take 1 capsule (2 mg total) by mouth 3 (three) times daily as needed for muscle spasms. Do not drink alcohol or drive while taking this medication.  May cause drowsiness. Patient not taking: Reported on 10/31/2023 11/07/21   Stuart Vernell Norris, PA-C    Blood pressure 111/69, pulse 72, temperature (!) 97.5 F (36.4 C), temperature source Oral, height 5' 1 (1.549 m), weight 152 lb (68.9 kg), SpO2 96%. Exam: General: Communicates without difficulty, well nourished, no acute distress. Head: Normocephalic, no evidence injury, no tenderness, facial buttresses intact without stepoff. Face/sinus: No tenderness to palpation and percussion. Facial movement is normal and symmetric. Eyes: PERRL, EOMI. No scleral icterus, conjunctivae clear. Neuro: CN II exam reveals vision grossly intact.  No nystagmus at any point of gaze. Ears: Auricles well formed without lesions.  Ear canals are intact without mass or lesion.  No erythema or edema is appreciated.  The TMs are intact without fluid. Nose: External evaluation reveals normal support and skin without lesions.  Dorsum is intact.  Anterior rhinoscopy reveals normal mucosa over anterior aspect of inferior turbinates and intact septum.  No purulence noted. Oral:  Oral cavity and oropharynx are intact, symmetric, without erythema or edema.  Mucosa is moist without lesions. Neck: Full range of motion without pain.  There is no significant lymphadenopathy.  No masses palpable.  Thyroid bed within normal limits to palpation.  Parotid glands and submandibular glands equal bilaterally without mass.  Trachea  is midline. Neuro:  CN 2-12 grossly intact.   Assessment and Plan Assessment & Plan Asymmetric bilateral sensorineural hearing loss, left worse than right, under evaluation for possible acoustic neuroma Mild bilateral sensorineural hearing loss with left ear worse than right. No recent ear infections, surgeries, or significant ear problems reported. No pain or drainage noted. The likelihood of an acoustic neuroma is low, approximately 1%, but requires evaluation due to asymmetry. - Order MRI scan of the brain to evaluate for possible acoustic neuroma. - If MRI is negative, no further immediate action is required. - Schedule follow-up in one year if MRI is negative to monitor hearing status.       Karina Nofsinger W Oluwatobi Visser 10/31/2023, 1:08 PM

## 2023-11-05 DIAGNOSIS — G9009 Other idiopathic peripheral autonomic neuropathy: Secondary | ICD-10-CM | POA: Diagnosis not present

## 2023-11-05 DIAGNOSIS — Z79899 Other long term (current) drug therapy: Secondary | ICD-10-CM | POA: Diagnosis not present

## 2023-11-05 DIAGNOSIS — D510 Vitamin B12 deficiency anemia due to intrinsic factor deficiency: Secondary | ICD-10-CM | POA: Diagnosis not present

## 2023-11-05 DIAGNOSIS — M4800 Spinal stenosis, site unspecified: Secondary | ICD-10-CM | POA: Diagnosis not present

## 2023-11-07 ENCOUNTER — Telehealth (INDEPENDENT_AMBULATORY_CARE_PROVIDER_SITE_OTHER): Payer: Self-pay

## 2023-11-07 ENCOUNTER — Telehealth (INDEPENDENT_AMBULATORY_CARE_PROVIDER_SITE_OTHER): Payer: Self-pay | Admitting: Otolaryngology

## 2023-11-07 NOTE — Telephone Encounter (Signed)
 Pt LVM that no one has called her about MRI getting scheduled. I gave note to Timea.

## 2023-11-07 NOTE — Telephone Encounter (Signed)
 Returned patient 's call . LVM to call Central scheduling regarding MRI : [phone]

## 2023-11-07 NOTE — Telephone Encounter (Signed)
 Patient left a voicemail requesting the phone number to get her imaging scheduled, I called her back and left the contact number for scheduling on her voicemail.

## 2023-11-12 DIAGNOSIS — R7301 Impaired fasting glucose: Secondary | ICD-10-CM | POA: Diagnosis not present

## 2023-11-12 DIAGNOSIS — Z8673 Personal history of transient ischemic attack (TIA), and cerebral infarction without residual deficits: Secondary | ICD-10-CM | POA: Diagnosis not present

## 2023-11-12 DIAGNOSIS — D510 Vitamin B12 deficiency anemia due to intrinsic factor deficiency: Secondary | ICD-10-CM | POA: Diagnosis not present

## 2023-11-12 DIAGNOSIS — Z0001 Encounter for general adult medical examination with abnormal findings: Secondary | ICD-10-CM | POA: Diagnosis not present

## 2023-11-12 DIAGNOSIS — R001 Bradycardia, unspecified: Secondary | ICD-10-CM | POA: Diagnosis not present

## 2023-11-12 DIAGNOSIS — Z23 Encounter for immunization: Secondary | ICD-10-CM | POA: Diagnosis not present

## 2023-11-12 DIAGNOSIS — M1990 Unspecified osteoarthritis, unspecified site: Secondary | ICD-10-CM | POA: Diagnosis not present

## 2023-11-12 DIAGNOSIS — E785 Hyperlipidemia, unspecified: Secondary | ICD-10-CM | POA: Diagnosis not present

## 2023-11-12 DIAGNOSIS — G629 Polyneuropathy, unspecified: Secondary | ICD-10-CM | POA: Diagnosis not present

## 2023-11-12 DIAGNOSIS — I10 Essential (primary) hypertension: Secondary | ICD-10-CM | POA: Diagnosis not present

## 2023-11-13 ENCOUNTER — Other Ambulatory Visit (HOSPITAL_COMMUNITY): Payer: Self-pay | Admitting: Internal Medicine

## 2023-11-13 ENCOUNTER — Ambulatory Visit (HOSPITAL_COMMUNITY)
Admission: RE | Admit: 2023-11-13 | Discharge: 2023-11-13 | Disposition: A | Discharge status: Home / Self Care | Source: Ambulatory Visit | Attending: Otolaryngology | Admitting: Otolaryngology

## 2023-11-13 DIAGNOSIS — M1990 Unspecified osteoarthritis, unspecified site: Secondary | ICD-10-CM

## 2023-11-13 DIAGNOSIS — H903 Sensorineural hearing loss, bilateral: Secondary | ICD-10-CM | POA: Insufficient documentation

## 2023-11-13 MED ORDER — GADOBUTROL 1 MMOL/ML IV SOLN
7.0000 mL | Freq: Once | INTRAVENOUS | Status: AC | PRN
Start: 2023-11-13 — End: 2023-11-13
  Administered 2023-11-13: 7 mL via INTRAVENOUS

## 2023-11-16 ENCOUNTER — Telehealth (INDEPENDENT_AMBULATORY_CARE_PROVIDER_SITE_OTHER): Payer: Self-pay | Admitting: Otolaryngology

## 2023-11-16 NOTE — Telephone Encounter (Signed)
 Per Dr. Karis, I called patient and left her a voice mail,  that her brain MRI showed no tumor. She will follow up in one year , sooner if needed.

## 2023-11-29 ENCOUNTER — Telehealth: Payer: Self-pay | Admitting: Orthopedic Surgery

## 2023-11-29 DIAGNOSIS — M48062 Spinal stenosis, lumbar region with neurogenic claudication: Secondary | ICD-10-CM

## 2023-11-29 DIAGNOSIS — M5410 Radiculopathy, site unspecified: Secondary | ICD-10-CM

## 2023-11-29 DIAGNOSIS — M545 Low back pain, unspecified: Secondary | ICD-10-CM

## 2023-11-29 NOTE — Telephone Encounter (Signed)
 Dr. Areatha pt - pt lvm asking for a referral to [facility] for a back injection.  [phone]

## 2023-11-29 NOTE — Telephone Encounter (Signed)
 Put in referral /I  called spine services to advise unable to leave message since its after 4 I will call back Monday to advise of the referral  Called patient to advise and to give number to call and schedule she can call [phone] but will have to wait until Monday afternoon (I'm off tomorrow )  Her calls are being screened and I could not get her according to message  If she calls back please let her know order sent I will call Monday to let them know the referral is there * they are view only in Epic.  She can call Monday to schedule

## 2023-11-30 ENCOUNTER — Other Ambulatory Visit: Payer: Self-pay | Admitting: Orthopedic Surgery

## 2023-11-30 DIAGNOSIS — M545 Low back pain, unspecified: Secondary | ICD-10-CM

## 2023-11-30 DIAGNOSIS — M48062 Spinal stenosis, lumbar region with neurogenic claudication: Secondary | ICD-10-CM

## 2023-11-30 DIAGNOSIS — M5410 Radiculopathy, site unspecified: Secondary | ICD-10-CM

## 2023-12-03 NOTE — Telephone Encounter (Signed)
 Called to advise.

## 2023-12-05 ENCOUNTER — Encounter: Payer: Self-pay | Admitting: Orthopedic Surgery

## 2023-12-05 NOTE — Telephone Encounter (Signed)
 Patient lvm today asking about getting an injection in [place], wanting an order

## 2023-12-05 NOTE — Telephone Encounter (Signed)
 They are calling her and so have I  Her calls are blocked  They have order   [phone] is the number if she calls back

## 2023-12-18 ENCOUNTER — Ambulatory Visit (HOSPITAL_COMMUNITY)
Admission: RE | Admit: 2023-12-18 | Discharge: 2023-12-18 | Disposition: A | Discharge status: Home / Self Care | Source: Ambulatory Visit | Attending: Internal Medicine | Admitting: Internal Medicine

## 2023-12-18 DIAGNOSIS — M8589 Other specified disorders of bone density and structure, multiple sites: Secondary | ICD-10-CM | POA: Diagnosis not present

## 2023-12-18 DIAGNOSIS — M1990 Unspecified osteoarthritis, unspecified site: Secondary | ICD-10-CM | POA: Insufficient documentation

## 2023-12-18 NOTE — Discharge Instructions (Signed)

## 2023-12-24 ENCOUNTER — Ambulatory Visit
Admission: RE | Admit: 2023-12-24 | Discharge: 2023-12-24 | Disposition: A | Source: Ambulatory Visit | Attending: Orthopedic Surgery | Admitting: Orthopedic Surgery

## 2023-12-24 DIAGNOSIS — M545 Low back pain, unspecified: Secondary | ICD-10-CM

## 2023-12-24 DIAGNOSIS — M48062 Spinal stenosis, lumbar region with neurogenic claudication: Secondary | ICD-10-CM

## 2023-12-24 DIAGNOSIS — M5410 Radiculopathy, site unspecified: Secondary | ICD-10-CM

## 2023-12-24 MED ORDER — METHYLPREDNISOLONE ACETATE 40 MG/ML INJ SUSP (RADIOLOG
80.0000 mg | Freq: Once | INTRAMUSCULAR | Status: AC
  Administered 2023-12-24: 80 mg via EPIDURAL

## 2023-12-24 MED ORDER — IOPAMIDOL (ISOVUE-M 200) INJECTION 41%
1.0000 mL | Freq: Once | INTRAMUSCULAR | Status: AC
  Administered 2023-12-24: 1 mL via EPIDURAL

## 2023-12-25 DIAGNOSIS — D519 Vitamin B12 deficiency anemia, unspecified: Secondary | ICD-10-CM | POA: Diagnosis not present
